# Patient Record
Sex: Female | Born: 1955
Health system: Southern US, Community
[De-identification: ages and names within clinical notes are randomized; demographics above are authoritative.]

## PROBLEM LIST (undated history)

## (undated) DIAGNOSIS — E1169 Type 2 diabetes mellitus with other specified complication: Secondary | ICD-10-CM

## (undated) DIAGNOSIS — F419 Anxiety disorder, unspecified: Secondary | ICD-10-CM

## (undated) DIAGNOSIS — R748 Abnormal levels of other serum enzymes: Secondary | ICD-10-CM

## (undated) DIAGNOSIS — I10 Essential (primary) hypertension: Secondary | ICD-10-CM

## (undated) DIAGNOSIS — G629 Polyneuropathy, unspecified: Secondary | ICD-10-CM

## (undated) DIAGNOSIS — K5909 Other constipation: Secondary | ICD-10-CM

## (undated) DIAGNOSIS — M545 Low back pain, unspecified: Secondary | ICD-10-CM

## (undated) DIAGNOSIS — E785 Hyperlipidemia, unspecified: Secondary | ICD-10-CM

## (undated) DIAGNOSIS — E7800 Pure hypercholesterolemia, unspecified: Secondary | ICD-10-CM

## (undated) DIAGNOSIS — I2510 Atherosclerotic heart disease of native coronary artery without angina pectoris: Secondary | ICD-10-CM

## (undated) DIAGNOSIS — E669 Obesity, unspecified: Secondary | ICD-10-CM

## (undated) DIAGNOSIS — F3341 Major depressive disorder, recurrent, in partial remission: Secondary | ICD-10-CM

## (undated) HISTORY — DX: Type 2 diabetes mellitus with other specified complication: E11.69

## (undated) HISTORY — DX: Low back pain, unspecified: M54.50

## (undated) HISTORY — DX: Other constipation: K59.09

## (undated) HISTORY — DX: Abnormal levels of other serum enzymes: R74.8

## (undated) HISTORY — DX: Type 2 diabetes mellitus with other specified complication: E66.9

## (undated) HISTORY — DX: Anxiety disorder, unspecified: F41.9

## (undated) HISTORY — DX: Essential (primary) hypertension: I10

## (undated) HISTORY — PX: ABDOMINAL HYSTERECTOMY: SHX81

## (undated) HISTORY — DX: Hyperlipidemia, unspecified: E78.5

## (undated) HISTORY — DX: Obesity, unspecified: E66.9

## (undated) HISTORY — DX: Polyneuropathy, unspecified: G62.9

## (undated) HISTORY — DX: Major depressive disorder, recurrent, in partial remission: F33.41

## (undated) HISTORY — PX: CARDIAC CATHETERIZATION: SHX172

## (undated) HISTORY — DX: Pure hypercholesterolemia, unspecified: E78.00

---

## 2000-03-06 ENCOUNTER — Encounter: Payer: Self-pay | Admitting: Family Medicine

## 2000-03-06 ENCOUNTER — Encounter: Admission: RE | Admit: 2000-03-06 | Discharge: 2000-03-06 | Payer: Self-pay | Admitting: Family Medicine

## 2001-04-22 ENCOUNTER — Encounter: Admission: RE | Admit: 2001-04-22 | Discharge: 2001-04-22 | Payer: Self-pay | Admitting: Family Medicine

## 2001-04-22 ENCOUNTER — Encounter: Payer: Self-pay | Admitting: Family Medicine

## 2002-03-04 ENCOUNTER — Encounter: Admission: RE | Admit: 2002-03-04 | Discharge: 2002-03-04 | Payer: Self-pay | Admitting: Family Medicine

## 2002-03-04 ENCOUNTER — Encounter: Payer: Self-pay | Admitting: Family Medicine

## 2002-06-25 ENCOUNTER — Encounter: Admission: RE | Admit: 2002-06-25 | Discharge: 2002-06-25 | Payer: Self-pay | Admitting: Family Medicine

## 2002-06-25 ENCOUNTER — Encounter: Payer: Self-pay | Admitting: Family Medicine

## 2002-08-05 ENCOUNTER — Encounter: Payer: Self-pay | Admitting: Cardiology

## 2002-08-05 ENCOUNTER — Ambulatory Visit (HOSPITAL_COMMUNITY): Admission: RE | Admit: 2002-08-05 | Discharge: 2002-08-05 | Payer: Self-pay | Admitting: Cardiology

## 2004-05-05 ENCOUNTER — Encounter: Admission: RE | Admit: 2004-05-05 | Discharge: 2004-05-05 | Payer: Self-pay | Admitting: Family Medicine

## 2005-11-08 ENCOUNTER — Encounter: Admission: RE | Admit: 2005-11-08 | Discharge: 2005-11-08 | Payer: Self-pay | Admitting: Family Medicine

## 2006-02-07 ENCOUNTER — Encounter: Admission: RE | Admit: 2006-02-07 | Discharge: 2006-02-07 | Payer: Self-pay | Admitting: Family Medicine

## 2006-02-17 ENCOUNTER — Emergency Department: Payer: Self-pay | Admitting: Emergency Medicine

## 2006-02-21 ENCOUNTER — Other Ambulatory Visit: Admission: RE | Admit: 2006-02-21 | Discharge: 2006-02-21 | Payer: Self-pay | Admitting: Family Medicine

## 2006-02-25 ENCOUNTER — Encounter: Admission: RE | Admit: 2006-02-25 | Discharge: 2006-02-25 | Payer: Self-pay | Admitting: Family Medicine

## 2006-04-08 ENCOUNTER — Ambulatory Visit: Payer: Self-pay | Admitting: Family Medicine

## 2006-04-22 ENCOUNTER — Ambulatory Visit: Payer: Self-pay | Admitting: Family Medicine

## 2006-07-23 ENCOUNTER — Ambulatory Visit: Payer: Self-pay | Admitting: Family Medicine

## 2006-07-23 ENCOUNTER — Encounter: Admission: RE | Admit: 2006-07-23 | Discharge: 2006-07-23 | Payer: Self-pay | Admitting: Family Medicine

## 2006-08-20 ENCOUNTER — Ambulatory Visit (HOSPITAL_COMMUNITY): Admission: RE | Admit: 2006-08-20 | Discharge: 2006-08-20 | Payer: Self-pay | Admitting: Orthopedic Surgery

## 2006-10-10 ENCOUNTER — Ambulatory Visit: Payer: Self-pay | Admitting: Family Medicine

## 2007-02-07 ENCOUNTER — Ambulatory Visit: Payer: Self-pay | Admitting: Family Medicine

## 2007-07-17 ENCOUNTER — Ambulatory Visit: Payer: Self-pay | Admitting: Family Medicine

## 2007-09-22 ENCOUNTER — Ambulatory Visit: Payer: Self-pay | Admitting: Family Medicine

## 2007-12-24 ENCOUNTER — Ambulatory Visit: Payer: Self-pay | Admitting: Family Medicine

## 2008-02-04 ENCOUNTER — Ambulatory Visit: Payer: Self-pay | Admitting: Family Medicine

## 2008-06-03 ENCOUNTER — Emergency Department (HOSPITAL_COMMUNITY): Admission: EM | Admit: 2008-06-03 | Discharge: 2008-06-03 | Payer: Self-pay | Admitting: Emergency Medicine

## 2008-06-08 ENCOUNTER — Ambulatory Visit: Payer: Self-pay | Admitting: Family Medicine

## 2008-06-22 ENCOUNTER — Ambulatory Visit (HOSPITAL_COMMUNITY): Admission: RE | Admit: 2008-06-22 | Discharge: 2008-06-22 | Payer: Self-pay | Admitting: Obstetrics and Gynecology

## 2008-06-22 ENCOUNTER — Encounter (INDEPENDENT_AMBULATORY_CARE_PROVIDER_SITE_OTHER): Payer: Self-pay | Admitting: Obstetrics and Gynecology

## 2009-02-09 ENCOUNTER — Ambulatory Visit: Payer: Self-pay | Admitting: Family Medicine

## 2009-05-24 ENCOUNTER — Encounter (INDEPENDENT_AMBULATORY_CARE_PROVIDER_SITE_OTHER): Payer: Self-pay | Admitting: Obstetrics and Gynecology

## 2009-05-24 ENCOUNTER — Ambulatory Visit (HOSPITAL_COMMUNITY): Admission: RE | Admit: 2009-05-24 | Discharge: 2009-05-25 | Payer: Self-pay | Admitting: Obstetrics and Gynecology

## 2009-06-10 ENCOUNTER — Ambulatory Visit: Admission: RE | Admit: 2009-06-10 | Discharge: 2009-06-10 | Payer: Self-pay | Admitting: Gynecology

## 2009-09-28 ENCOUNTER — Ambulatory Visit: Payer: Self-pay | Admitting: Family Medicine

## 2009-09-28 ENCOUNTER — Encounter: Admission: RE | Admit: 2009-09-28 | Discharge: 2009-09-28 | Payer: Self-pay | Admitting: Family Medicine

## 2010-01-05 ENCOUNTER — Ambulatory Visit: Payer: Self-pay | Admitting: Family Medicine

## 2010-01-19 ENCOUNTER — Ambulatory Visit: Payer: Self-pay | Admitting: Physician Assistant

## 2010-01-27 ENCOUNTER — Encounter: Admission: RE | Admit: 2010-01-27 | Discharge: 2010-01-27 | Payer: Self-pay | Admitting: Gastroenterology

## 2010-04-24 ENCOUNTER — Ambulatory Visit: Payer: Self-pay | Admitting: Family Medicine

## 2010-08-08 ENCOUNTER — Ambulatory Visit: Payer: Self-pay | Admitting: Family Medicine

## 2010-11-29 ENCOUNTER — Ambulatory Visit
Admission: RE | Admit: 2010-11-29 | Discharge: 2010-11-29 | Payer: Self-pay | Source: Home / Self Care | Attending: Family Medicine | Admitting: Family Medicine

## 2011-02-11 LAB — CBC
HCT: 30.1 % — ABNORMAL LOW (ref 36.0–46.0)
Hemoglobin: 9.6 g/dL — ABNORMAL LOW (ref 12.0–15.0)
MCHC: 33.5 g/dL (ref 30.0–36.0)
MCV: 84.2 fL (ref 78.0–100.0)
Platelets: 387 10*3/uL (ref 150–400)
RBC: 4.5 MIL/uL (ref 3.87–5.11)
WBC: 11.1 10*3/uL — ABNORMAL HIGH (ref 4.0–10.5)
WBC: 8.2 10*3/uL (ref 4.0–10.5)

## 2011-02-11 LAB — COMPREHENSIVE METABOLIC PANEL
Albumin: 3.7 g/dL (ref 3.5–5.2)
Alkaline Phosphatase: 60 U/L (ref 39–117)
BUN: 6 mg/dL (ref 6–23)
Creatinine, Ser: 0.69 mg/dL (ref 0.4–1.2)
Glucose, Bld: 242 mg/dL — ABNORMAL HIGH (ref 70–99)
Potassium: 3.4 mEq/L — ABNORMAL LOW (ref 3.5–5.1)
Sodium: 139 mEq/L (ref 135–145)
Total Bilirubin: 0.4 mg/dL (ref 0.3–1.2)
Total Protein: 7.3 g/dL (ref 6.0–8.3)

## 2011-02-11 LAB — GLUCOSE, CAPILLARY
Glucose-Capillary: 155 mg/dL — ABNORMAL HIGH (ref 70–99)
Glucose-Capillary: 184 mg/dL — ABNORMAL HIGH (ref 70–99)
Glucose-Capillary: 204 mg/dL — ABNORMAL HIGH (ref 70–99)

## 2011-02-11 LAB — URINALYSIS, ROUTINE W REFLEX MICROSCOPIC
Bilirubin Urine: NEGATIVE
Glucose, UA: 500 mg/dL — AB
Nitrite: NEGATIVE
Urobilinogen, UA: 0.2 mg/dL (ref 0.0–1.0)
pH: 6 (ref 5.0–8.0)

## 2011-02-11 LAB — BASIC METABOLIC PANEL
Calcium: 8.8 mg/dL (ref 8.4–10.5)
Creatinine, Ser: 0.48 mg/dL (ref 0.4–1.2)
GFR calc non Af Amer: >60 mL/min (ref >60)
Potassium: 4.9 mEq/L (ref 3.5–5.1)

## 2011-02-11 LAB — PREGNANCY, URINE: Preg Test, Ur: NEGATIVE

## 2011-03-20 NOTE — Op Note (Signed)
NAME:  Angel Reynolds, Angel Reynolds NO.:  [id number]   MEDICAL RECORD NO.:  [id number]          PATIENT TYPE:  AMB   LOCATION:  SDC                           FACILITY:  [facility]   PHYSICIAN:  Malva Limes, M.D.    DATE OF BIRTH:  1956-02-25   DATE OF PROCEDURE:  06/22/2008  DATE OF DISCHARGE:                               OPERATIVE REPORT   PREOPERATIVE DIAGNOSIS:  Postmenopausal bleeding.   POSTOPERATIVE DIAGNOSIS:  Postmenopausal bleeding.   PROCEDURE:  1. Dilation and curettage.  2. Hysteroscopy.   SURGEON:  Malva Limes, MD.   ANESTHESIA:  MAC with paracervical block.   DRAINS:  None.   ANTIBIOTIC:  Ancef 1 gram.   ESTIMATED BLOOD LOSS:  Minimal.   SPECIMENS:  Endometrial curettings sent to pathology.   FINDINGS:  The patient had multiple uterine polyps and several submucous  fibroids.   DESCRIPTION OF PROCEDURE:  The patient was taken to the operating room  where she was placed in a dorsal supine position.  MAC anesthesia was  administered without difficulty.  The patient was then placed in a  dorsal lithotomy position.  She was prepped and draped in the usual  fashion for this procedure.  The patient had an exam under anesthesia  which revealed a multilobulated uterus consistent with fibroids,  approximately 12 weeks in size.  At this point, the cervix was injected  with 10 mL of 1% lidocaine.  The cervix was serially dilated and the  hysteroscope was advanced to the endocervical canal, which appeared to  be normal.  On entering the uterine cavity, extensive polyps were  identified.  At this point, the hysteroscope was removed and sharp  curettage was performed with a copious amount of tissue being sent to  pathology.  Hysteroscope was replaced after the D&C and it appeared all  polyps had been removed.  As a conclusion of the procedure, the patient  was taken to the recovery room in stable condition.  Instrument and lap  counts were correct x1.     ______________________________  Malva Limes, M.D.    MA/MEDQ  D:  06/22/2008  T:  06/22/2008  Job:  [id number]

## 2011-03-20 NOTE — Op Note (Signed)
NAMESHERLYNN, TOURVILLE NO.:  [id number]   MEDICAL RECORD NO.:  [id number]          PATIENT TYPE:  INP   LOCATION:  9303                          FACILITY:  [facility]   PHYSICIAN:  Randye Lobo, M.D.   DATE OF BIRTH:  1955-12-17   DATE OF PROCEDURE:  05/24/2009  DATE OF DISCHARGE:                               OPERATIVE REPORT   PREOPERATIVE DIAGNOSES:  1. Complex endometrial hyperplasia with focal atypia.  2. Genuine stress incontinence.   POSTOPERATIVE DIAGNOSES:  1. Complex endometrial hyperplasia with focal atypia.  2. Genuine stress incontinence.   PROCEDURE:  Laparoscopically-assisted vaginal hysterectomy with  bilateral salpingo-oophorectomy, McCall culdoplasty, Monarch  transobturator mid urethral sling, cystoscopy.   SURGEON:  Randye Lobo, MD   ASSISTANT:  Gretchen Short, [facility]   ANESTHESIA:  General endotracheal.   ESTIMATED BLOOD LOSS:  500 mL.   URINE OUTPUT:  Quantity sufficient.   COMPLICATIONS:  None.   INDICATIONS FOR THE PROCEDURE:  The patient is a 55 year old, para 1,  Caucasian female, who was referred by Dr. Malva Limes for evaluation  and treatment of the patient's menometrorrhagia and urinary incontinence  with stressful maneuvers.  The patient originally had been cared for by  Elpidio Galea, who made the initial referral to Dr. Dareen Piano.  The patient  has had bleeding abnormalities for over 1 year and in August 2009, she  underwent a dilation and curettage at which time the pathology report  documented an endometrial polyp with a focus of simple hyperplasia.  The  patient recently had been experiencing heavy and prolonged menses and  she desires definitive treatment.  She would also like treatment of her  urinary incontinence.  She did undergo urodynamic testing in the office  which confirmed the presence of genuine stress incontinence.  She had a  preoperative endometrial biopsy has shown a simple and complex  hyperplasia with focal  atypia.  The specimen was thought to be adequate  for evaluation by the pathologist.  A plan is now made at this time to  proceed with a laparoscopically-assisted vaginal hysterectomy with  bilateral salpingo-oophorectomy and a Monarch transobturator mid  urethral sling and cystoscopy after risks, benefits, and alternatives  have been reviewed.   FINDINGS:  Examination under anesthesia revealed a tight pubic arch.  There was first-degree cervical prolapse.  There was no significant  cystocele, no rectocele.   Laparoscopy demonstrated a normal uterus, tubes, and ovaries.  The liver  edge could be visualized and this appeared to be normal, although the  liver was not visualized well.  This was due to the patient's bowel and  adiposity.   SPECIMENS:  The uterus, tubes, and ovaries were sent to pathology.   PROCEDURE:  The patient was reidentified in the preoperative hold area.  She did receive cefotetan 2 g IV for antibiotic prophylaxis.  She  received both TED hose and PAS stockings for DVT prophylaxis.   In the operating room, general endotracheal anesthesia was induced and  the patient was placed in the dorsal lithotomy position.  The abdomen,  vagina, and perineum were sterilely  prepped and draped.  A Foley  catheter was placed inside the bladder.  A speculum was placed inside  the vagina and a single-tooth tenaculum was placed on the anterior  cervical lip.  This was replaced with a Hulka tenaculum.   The procedure began by creating a 1-cm umbilical incision with a  scalpel.  A 10-mm trocar was inserted directly into the peritoneal  cavity after an unsuccessful attempt was made to use a Veress needle.  The laparoscope did confirm proper placement, and then a CO2  pneumoperitoneum was achieved.  The patient was placed in the  Trendelenburg position.  A 5-mm right and left lower quadrant incisions  were created and 5-mm trocars were placed under direct visualization of  the  laparoscope.   An exam of the pelvic and abdominal organs was performed.  There was  significant weight of the abdominal wall and there was a large amount of  adipose tissue hanging off the bowel which made the laparoscopically-  assisted vaginal hysterectomy and bilateral salpingo-oophorectomy a  challenge.  There were some adhesions between the sigmoid colon and the  left pelvic sidewall above the level of the infundibulopelvic ligament  and these were sharply lysed.   The region of the left ureter was identified.  The left  infundibulopelvic ligament was then triply cauterized with the gyrus  instrument and was divided with the same instrument.  The dissection  continued through the broad ligament again using cautery and cutting  from the gyrus instrument.  The left round ligament was then cauterized  and sharply bisected with the same instrument.  The dissection was  carried down to the uterine artery on the patient's left-hand side.  The  bladder flap was partially taken down on this side.  The same procedure  that was performed on the left-hand side was repeated on the right-hand  side after the right ureter was identified.  The bladder flap was not  taken down as far on the patient's right-hand side.   Hemostasis was good at this time and a decision was made to proceed with  the vaginal portion of the procedure.   The patient was placed in the high-lithotomy position.  Single-tooth  tenaculums were placed on the anterior and posterior cervical lips.  The  cervix was injected with 0.5% lidocaine with epinephrine 1:200,000.  The  cervix was circumscribed with a scalpel and the posterior cul-de-sac was  entered sharply with a Mayo scissors and digitally exam confirmed proper  entry into this location.   A long weighted speculum was placed inside the posterior cul-de-sac.  Each of the uterosacral ligaments were clamped, sharply divided, and  suture-ligated with transfixing  sutures of 0 Vicryl.  A second clamp was  placed on the upper portion of each of the uterosacral ligaments and  these were sharply divided and again suture-ligated with 0 Vicryl.   The bladder was dissected off the lower uterine segment.  The cervix was  noted to be very long.  The bladder pillars were clamped, sharply  divided, and suture-ligated with 0 Vicryl.  Sharp and careful dissection  was performed along the anterior cervix to provide proper entry into the  vesicouterine fold of the anterior cul-de-sac.  The cardinal ligaments  were sequentially clamped, sharply divided, and suture-ligated with 0  Vicryl bilaterally.   Eventually, the anterior cul-de-sac was entered and the Deaver retractor  was placed in the anterior cul-de-sac.  The remaining portion of the  cardinal ligaments and  the base of the broad ligaments were clamped,  sharply divided, and suture-ligated with 0 Vicryl bilaterally.  The  specimen was freed at this time.  There was some bleeding noted from the  uterine artery pedicle on the patient's right-hand side and this was  isolated and grasped with a clamp and then a free-tie was placed which  provided excellent hemostasis.  There was some bleeding noted of the  uterine artery pedicles bilaterally which were oozing slightly because  the peritoneum had not been included in each of these pedicles.  Therefore, figure-of-eight sutures were placed bilaterally along these  pedicles to create good hemostasis.   Hemostasis was noted to be adequate at this time.  The posterior vaginal  cuff was therefore whip stitched with a running locked suture of 0  Vicryl.  A culdoplasty suture was placed with 0 Vicryl through the  vagina at the 6 o'clock position and into the cul-de-sac, down through  the distal left uterosacral ligament.  It was then brought across the  posterior cul-de-sac in a pursestring fashion.  The suture was brought  down through the distal right uterosacral  ligament and then out of the  cul-de-sac and into the vagina at the 6 o'clock position.   The vagina was next closed at this time with a running locked suture of  0 Vicryl.  The culdoplasty suture was tied.  This provided good support  and elevation of the vaginal cuff.   The sling was performed next.  The anterior vaginal mucosa was marked  with 2 Allis clamps, one 1 cm below the urethral meatus, and one 4 cm  below the urethral meatus.  The mucosa was injected locally with 0.5%  lidocaine with 1:200,000 of epinephrine.  The vaginal mucosa was incised  vertically in the midline with a scalpel.  A combination of sharp and  blunt dissection were used to dissect the vaginal mucosa off the  overlying bladder tissue.  The dissection was carried back to the level  of the pubic rami bilaterally.  Hemostasis was created with monopolar  cautery.   The crural fold incisions were then created below the level of the  adductor longus muscles and lateral to the clitoris on each side at the  lateral borders of the pubic rami.  The Monarch needle passer was first  placed through the patient's left-hand side.  The needle passer was  brought through the obturator membrane and muscle and then out through  the endopelvic fascia on the ipsilateral side.  The same procedure was  performed on the right-hand side.  The sling was then attached through  each of these needle passers and was brought out through the thigh  incisions.   The Foley catheter was removed and cystoscopy.  The cystoscopy was  performed at this time.  The cystoscopy revealed the bladder to be  without evidence of a foreign body in either the bladder or the urethra.  The bladder was visualized throughout 360 degrees including the bladder  dome and trigone.  The ureters were noted to be patent bilaterally.   The bladder was drained of the cystoscopy fluid and the Foley catheter  was replaced.  A Kelly clamp was then placed between the  urethra and the  sling as the plastic sheaths were removed.  The sling was noted to be in  excellent position.  The excess sling was trimmed at the thigh  incisions.   Excess vaginal mucosa was trimmed and the anterior vaginal  mucosal  incision was closed with a running locked suture of 2-0 Vicryl.   Hemostasis was good and a packing with Estrace cream was placed inside  the vagina.  The thigh incisions were closed with Dermabond.   Final laparoscopy was performed after a pneumoperitoneum was achieved.  The pelvis was irrigated and suctioned and all of the pedicle sites were  examined and found to be hemostatic.  The procedure was therefore  discontinued.   The 5-mm trocars were removed under visualization of the laparoscope.  A  10-mm umbilical trocar and the laparoscope were removed simultaneously  after the pneumoperitoneum had been released.   The skin incisions were closed with subcuticular closures of 4-0 Vicryl  suture and Dermabond was placed over the incisions.   This concluded the patient's procedure.  There were no complications.  All needle, instrument, sponge counts were correct.      Randye Lobo, M.D.  Electronically Signed     BES/MEDQ  D:  05/24/2009  T:  05/25/2009  Job:  [id number]

## 2011-03-20 NOTE — Consult Note (Signed)
Angel Reynolds, Angel Reynolds NO.:  [id number]   MEDICAL RECORD NO.:  [id number]          PATIENT TYPE:  OUT   LOCATION:  GYN                          FACILITY:  [facility]   PHYSICIAN:  De Blanch, M.D.DATE OF BIRTH:  02-Jul-1956   DATE OF CONSULTATION:  DATE OF DISCHARGE:                                 CONSULTATION   CHIEF COMPLAINT:  Endometrial cancer.   HISTORY OF PRESENT ILLNESS:  A 55 year old white female seen in  consultation at the request of Dr. Edward Jolly regarding management of a newly  diagnosed well-differentiated endometrial carcinoma.   The patient underwent total laparoscopic hysterectomy and sling  procedure on May 24, 2009.  Preoperatively, it was known that she had  atypical endometrial hyperplasia.  She has had an uncomplicated  postoperative course.  Final pathology showed a well-differentiated  endometrioid adenocarcinoma arising in a background of complex  hyperplasia with atypia.  There is no evidence of myometrial invasion  involvement of adnexa or cervix.   PAST MEDICAL HISTORY/MEDICAL ILLNESSES:  1. Diabetes.  2. Hypertension.  3. Obesity.  4. Hyperlipidemia.  5. Anxiety.   PAST SURGICAL HISTORY:  1. Total laparoscopic hysterectomy and sling procedure.  2. Arthroscopy of her knee.   CURRENT MEDICATIONS:  Avandamet, Lantus, Lipitor, Tricor, Lotensin, baby  aspirin, iron, Prozac, Cosamin.   DRUG ALLERGIES:  MOBIC (PALPITATIONS).   FAMILY HISTORY:  The patient has a sister with breast cancer.   SOCIAL HISTORY:  The patient is married.  She does not smoke.  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   REVIEW OF SYSTEMS:  A 10-point comprehensive review of systems negative  except as noted above.   PHYSICAL EXAMINATION:  VITAL SIGNS:  Weight 240 pounds, height 5 feet 3  inches, pulse 130/62.  GENERAL:  The patient is a healthy white female in no acute distress.  She does not wish to be examined today.  She  indicates that she has no  surgical problems to be evaluated.   ASSESSMENT/PLAN:  I had a lengthy discussion with the patient and her  daughter regarding the pathology.  Overall, I have reviewed his as a  favorable pathology report (stage Ia grade 1), and therefore, would not  recommend  any additional adjuvant therapy.  The risks of recurrence are very  small.  Would suggest that she be seen by Dr. Edward Jolly every 6 months for  the next 3 years with physical exam and Pap smears at that interval.  I  would be happy to see the patient back if she had any difficulties, but  overall, I expect that she should do well.      De Blanch, M.D.  Electronically Signed     DC/MEDQ  D:  06/10/2009  T:  06/10/2009  Job:  [id number]   cc:   Randye Lobo, M.D.  Fax: [phone]   Telford Nab, R.N.  [address]  [place], [place] [address]

## 2011-03-23 NOTE — Op Note (Signed)
NAME:  Angel Reynolds, Angel Reynolds               ACCOUNT NO.:  [id number]   MEDICAL RECORD NO.:  [id number]          PATIENT TYPE:  AMB   LOCATION:  SDS                          FACILITY:  [facility]   PHYSICIAN:  Burnard Bunting, M.D.    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   PROCEDURES:  Left knee diagnostic and operative arthroscopy with partial  medial and lateral meniscectomy, debridement of loose chondral flap on  the medial femoral condyle and trochlea, with removal of 5 x 5-mm loose  bodies times one.   ATTENDING SURGEON:  Burnard Bunting, M.D.   ASSISTANT:  None.   ANESTHESIA:  General endotracheal.   ESTIMATED BLOOD LOSS:  Minimal.   INDICATIONS:  Angel Reynolds is a 55 year old patient with knee pain and  mechanical symptoms, who presents for diagnostic and operative  arthroscopy and debridement after failure of conservative management.   OPERATIVE FINDINGS:  1. Exam under anesthesia, range of motion 0 to 130 degrees with      stability to varus and valgus stress.  2. Diagnostic and operative arthroscopy:  Intact ACL, PCL.  Grade 4      chondromalacia in the length of the trochlea.  Grade 4      chondromalacia over the medial femoral condyle and medial tibial      plateau, 50 and 25% respectively, with tear of the posterior horn      of the medial meniscus.  Loose body, 5 x 5 mm, in the anterior      compartment.  Generally intact articular cartilage on the lateral      side, tibia and femur, with tear of the lateral meniscus anterior      horn.   PROCEDURE IN DETAIL:  The patient was brought to the operating room,  where  general endotracheal anesthesia was induced.  The left leg was  prepped with DuraPrep solution, including the foot, and draped in a  sterile manner.  Topographic anatomy of the knee was identified,  including the medial and lateral margins of the patellar tendon, the  medial and lateral joint line, and anterior and inferolateral portals  were established.  Anterior inferomedial portal was then established  under direct vision.  Next, diagnostic arthroscopy was performed.  The  patellofemoral compartment showed grade 2 changes of the undersurface of  the patella, with grade 4 changes and loose chondral flaps in the  trochlea in the landing zone, and these chondral flaps were debrided.  Chondroplasty was performed.  No loose bodies were noted in the medial  and lateral gutters.  The medial compartment was inspected and was found  to have chondromalacia, both grade 4 and grade 3, on the medial femoral  condyle.  There was a kissing lesion on the  tibial plateau.  The total  surface areas were approximately 50 and 25% respectively.  Loose  chondral flaps were debrided from the medial femoral condyle.  The  medial meniscal tear was resected back to a stable rim.  All in all,  about 50% of the meniscus was involved.  The ACL and PCL were intact.  A  loose body was noted in the anterior compartment, which was removed.  The lateral compartment showed a degenerative tear of the lateral  meniscus, which was debrided back to a stable rim using a combination of  basket, punch and shavers.  There were no loose bodies in the lateral  underneath the popliteus.  The articular cartilage of the lateral  compartment was intact.  Following debridement of the menisci,  chondroplasty and removal of loose bodies, the knee joint was thoroughly  irrigated.  Instruments were removed.  Portals were closed using 3-0  nylon.  A solution of Marcaine, morphine and clonidine was injected into  the knee.  The patient was  placed in a bulky dressing, tolerated the  procedure well without immediate complications.      Burnard Bunting, M.D.  Electronically Signed     GSD/MEDQ  D:  08/20/2006  T:  08/21/2006  Job:  [id number]

## 2011-08-03 LAB — POCT I-STAT, CHEM 8
BUN: 13
Calcium, Ion: 1.15
Chloride: 99
Creatinine, Ser: 0.8
Glucose, Bld: 150 — ABNORMAL HIGH
HCT: 33 — ABNORMAL LOW
Hemoglobin: 11.2 — ABNORMAL LOW
TCO2: 29

## 2011-08-03 LAB — GLUCOSE, CAPILLARY

## 2011-08-15 ENCOUNTER — Other Ambulatory Visit: Payer: Self-pay | Admitting: Medical

## 2011-08-15 NOTE — Telephone Encounter (Signed)
PLEASE SIGN OFF ON RX- TKS

## 2011-08-16 MED ORDER — FLUOXETINE HCL 40 MG PO CAPS: 40.0000 mg | ORAL_CAPSULE | Freq: Every day | ORAL | Status: DC

## 2011-09-18 ENCOUNTER — Encounter: Payer: Self-pay | Admitting: Family Medicine

## 2011-10-08 ENCOUNTER — Other Ambulatory Visit: Payer: Self-pay | Admitting: Medical

## 2011-10-09 NOTE — Telephone Encounter (Signed)
RX REFILL ON PROZAC.

## 2011-10-11 ENCOUNTER — Telehealth: Payer: Self-pay | Admitting: Family Medicine

## 2011-10-11 MED ORDER — FLUOXETINE HCL 40 MG PO CAPS: 40.0000 mg | ORAL_CAPSULE | Freq: Every day | ORAL | Status: DC

## 2011-10-11 NOTE — Telephone Encounter (Signed)
PT IS COMPLETELY OUT OF PROZAC HAS APPT ON Oct 22 2011. PT USES [facility] ON [address]

## 2011-10-11 NOTE — Telephone Encounter (Signed)
Prozac renewed patient has an appointment in the near future

## 2011-10-13 ENCOUNTER — Other Ambulatory Visit: Payer: Self-pay | Admitting: Medical

## 2011-10-15 NOTE — Telephone Encounter (Signed)
pls pull chart 

## 2011-10-15 NOTE — Telephone Encounter (Signed)
rx refill on prozac.

## 2011-10-16 NOTE — Telephone Encounter (Signed)
pls pull chart 

## 2011-10-16 NOTE — Telephone Encounter (Signed)
rx refill on prozac. 

## 2011-10-22 ENCOUNTER — Encounter: Payer: Self-pay | Admitting: Medical

## 2012-12-12 ENCOUNTER — Telehealth: Payer: Self-pay | Admitting: Internal Medicine

## 2012-12-12 NOTE — Telephone Encounter (Signed)
Pt is transferring out and will come by to pay $10.00 to get her records faxed over to [facility]

## 2013-01-26 DIAGNOSIS — Z0289 Encounter for other administrative examinations: Secondary | ICD-10-CM

## 2014-12-14 ENCOUNTER — Emergency Department: Payer: Self-pay | Admitting: Emergency Medicine

## 2016-05-02 ENCOUNTER — Ambulatory Visit: Payer: BLUE CROSS/BLUE SHIELD | Attending: General Practice | Admitting: Physical Therapy

## 2019-12-02 DIAGNOSIS — F17200 Nicotine dependence, unspecified, uncomplicated: Secondary | ICD-10-CM | POA: Diagnosis not present

## 2019-12-02 DIAGNOSIS — M545 Low back pain: Secondary | ICD-10-CM | POA: Diagnosis not present

## 2019-12-02 DIAGNOSIS — K5909 Other constipation: Secondary | ICD-10-CM | POA: Diagnosis not present

## 2019-12-02 DIAGNOSIS — I10 Essential (primary) hypertension: Secondary | ICD-10-CM | POA: Diagnosis not present

## 2019-12-02 DIAGNOSIS — F419 Anxiety disorder, unspecified: Secondary | ICD-10-CM | POA: Diagnosis not present

## 2019-12-02 DIAGNOSIS — R748 Abnormal levels of other serum enzymes: Secondary | ICD-10-CM | POA: Diagnosis not present

## 2019-12-02 DIAGNOSIS — F329 Major depressive disorder, single episode, unspecified: Secondary | ICD-10-CM | POA: Diagnosis not present

## 2019-12-02 DIAGNOSIS — G629 Polyneuropathy, unspecified: Secondary | ICD-10-CM | POA: Diagnosis not present

## 2019-12-02 DIAGNOSIS — E7800 Pure hypercholesterolemia, unspecified: Secondary | ICD-10-CM | POA: Diagnosis not present

## 2019-12-30 DIAGNOSIS — F419 Anxiety disorder, unspecified: Secondary | ICD-10-CM | POA: Diagnosis not present

## 2019-12-30 DIAGNOSIS — Z6832 Body mass index (BMI) 32.0-32.9, adult: Secondary | ICD-10-CM | POA: Diagnosis not present

## 2019-12-30 DIAGNOSIS — I10 Essential (primary) hypertension: Secondary | ICD-10-CM | POA: Diagnosis not present

## 2019-12-30 DIAGNOSIS — M545 Low back pain: Secondary | ICD-10-CM | POA: Diagnosis not present

## 2019-12-30 DIAGNOSIS — K5909 Other constipation: Secondary | ICD-10-CM | POA: Diagnosis not present

## 2019-12-30 DIAGNOSIS — R748 Abnormal levels of other serum enzymes: Secondary | ICD-10-CM | POA: Diagnosis not present

## 2019-12-30 DIAGNOSIS — F329 Major depressive disorder, single episode, unspecified: Secondary | ICD-10-CM | POA: Diagnosis not present

## 2019-12-30 DIAGNOSIS — E7800 Pure hypercholesterolemia, unspecified: Secondary | ICD-10-CM | POA: Diagnosis not present

## 2019-12-30 DIAGNOSIS — G629 Polyneuropathy, unspecified: Secondary | ICD-10-CM | POA: Diagnosis not present

## 2020-01-20 DIAGNOSIS — E7800 Pure hypercholesterolemia, unspecified: Secondary | ICD-10-CM | POA: Diagnosis not present

## 2020-01-20 DIAGNOSIS — R748 Abnormal levels of other serum enzymes: Secondary | ICD-10-CM | POA: Diagnosis not present

## 2020-01-20 DIAGNOSIS — F329 Major depressive disorder, single episode, unspecified: Secondary | ICD-10-CM | POA: Diagnosis not present

## 2020-01-20 DIAGNOSIS — G629 Polyneuropathy, unspecified: Secondary | ICD-10-CM | POA: Diagnosis not present

## 2020-01-20 DIAGNOSIS — F419 Anxiety disorder, unspecified: Secondary | ICD-10-CM | POA: Diagnosis not present

## 2020-01-20 DIAGNOSIS — K5909 Other constipation: Secondary | ICD-10-CM | POA: Diagnosis not present

## 2020-01-20 DIAGNOSIS — E119 Type 2 diabetes mellitus without complications: Secondary | ICD-10-CM | POA: Diagnosis not present

## 2020-01-20 DIAGNOSIS — M545 Low back pain: Secondary | ICD-10-CM | POA: Diagnosis not present

## 2020-01-20 DIAGNOSIS — I10 Essential (primary) hypertension: Secondary | ICD-10-CM | POA: Diagnosis not present

## 2020-02-25 DIAGNOSIS — F17200 Nicotine dependence, unspecified, uncomplicated: Secondary | ICD-10-CM | POA: Diagnosis not present

## 2020-02-25 DIAGNOSIS — K5909 Other constipation: Secondary | ICD-10-CM | POA: Diagnosis not present

## 2020-02-25 DIAGNOSIS — M25552 Pain in left hip: Secondary | ICD-10-CM | POA: Diagnosis not present

## 2020-02-25 DIAGNOSIS — M545 Low back pain: Secondary | ICD-10-CM | POA: Diagnosis not present

## 2020-02-25 DIAGNOSIS — F419 Anxiety disorder, unspecified: Secondary | ICD-10-CM | POA: Diagnosis not present

## 2020-02-25 DIAGNOSIS — F329 Major depressive disorder, single episode, unspecified: Secondary | ICD-10-CM | POA: Diagnosis not present

## 2020-02-25 DIAGNOSIS — G629 Polyneuropathy, unspecified: Secondary | ICD-10-CM | POA: Diagnosis not present

## 2020-02-25 DIAGNOSIS — E669 Obesity, unspecified: Secondary | ICD-10-CM | POA: Diagnosis not present

## 2020-02-25 DIAGNOSIS — R748 Abnormal levels of other serum enzymes: Secondary | ICD-10-CM | POA: Diagnosis not present

## 2020-03-10 DIAGNOSIS — I10 Essential (primary) hypertension: Secondary | ICD-10-CM | POA: Diagnosis not present

## 2020-03-10 DIAGNOSIS — F329 Major depressive disorder, single episode, unspecified: Secondary | ICD-10-CM | POA: Diagnosis not present

## 2020-03-10 DIAGNOSIS — E7800 Pure hypercholesterolemia, unspecified: Secondary | ICD-10-CM | POA: Diagnosis not present

## 2020-03-10 DIAGNOSIS — K5909 Other constipation: Secondary | ICD-10-CM | POA: Diagnosis not present

## 2020-03-10 DIAGNOSIS — G629 Polyneuropathy, unspecified: Secondary | ICD-10-CM | POA: Diagnosis not present

## 2020-03-10 DIAGNOSIS — E669 Obesity, unspecified: Secondary | ICD-10-CM | POA: Diagnosis not present

## 2020-03-10 DIAGNOSIS — R748 Abnormal levels of other serum enzymes: Secondary | ICD-10-CM | POA: Diagnosis not present

## 2020-03-10 DIAGNOSIS — F419 Anxiety disorder, unspecified: Secondary | ICD-10-CM | POA: Diagnosis not present

## 2020-03-10 DIAGNOSIS — F17200 Nicotine dependence, unspecified, uncomplicated: Secondary | ICD-10-CM | POA: Diagnosis not present

## 2020-03-24 DIAGNOSIS — E7800 Pure hypercholesterolemia, unspecified: Secondary | ICD-10-CM | POA: Diagnosis not present

## 2020-03-24 DIAGNOSIS — I10 Essential (primary) hypertension: Secondary | ICD-10-CM | POA: Diagnosis not present

## 2020-03-24 DIAGNOSIS — E669 Obesity, unspecified: Secondary | ICD-10-CM | POA: Diagnosis not present

## 2020-03-24 DIAGNOSIS — M545 Low back pain: Secondary | ICD-10-CM | POA: Diagnosis not present

## 2020-03-24 DIAGNOSIS — Z6832 Body mass index (BMI) 32.0-32.9, adult: Secondary | ICD-10-CM | POA: Diagnosis not present

## 2020-03-24 DIAGNOSIS — K5909 Other constipation: Secondary | ICD-10-CM | POA: Diagnosis not present

## 2020-03-24 DIAGNOSIS — F329 Major depressive disorder, single episode, unspecified: Secondary | ICD-10-CM | POA: Diagnosis not present

## 2020-05-05 DIAGNOSIS — F329 Major depressive disorder, single episode, unspecified: Secondary | ICD-10-CM | POA: Diagnosis not present

## 2020-05-05 DIAGNOSIS — E7800 Pure hypercholesterolemia, unspecified: Secondary | ICD-10-CM | POA: Diagnosis not present

## 2020-05-05 DIAGNOSIS — R748 Abnormal levels of other serum enzymes: Secondary | ICD-10-CM | POA: Diagnosis not present

## 2020-05-05 DIAGNOSIS — M545 Low back pain: Secondary | ICD-10-CM | POA: Diagnosis not present

## 2020-05-05 DIAGNOSIS — E119 Type 2 diabetes mellitus without complications: Secondary | ICD-10-CM | POA: Diagnosis not present

## 2020-05-05 DIAGNOSIS — I10 Essential (primary) hypertension: Secondary | ICD-10-CM | POA: Diagnosis not present

## 2020-05-05 DIAGNOSIS — F419 Anxiety disorder, unspecified: Secondary | ICD-10-CM | POA: Diagnosis not present

## 2020-05-05 DIAGNOSIS — K5909 Other constipation: Secondary | ICD-10-CM | POA: Diagnosis not present

## 2020-05-05 DIAGNOSIS — G629 Polyneuropathy, unspecified: Secondary | ICD-10-CM | POA: Diagnosis not present

## 2020-06-02 DIAGNOSIS — K5909 Other constipation: Secondary | ICD-10-CM | POA: Diagnosis not present

## 2020-06-02 DIAGNOSIS — G629 Polyneuropathy, unspecified: Secondary | ICD-10-CM | POA: Diagnosis not present

## 2020-06-02 DIAGNOSIS — R748 Abnormal levels of other serum enzymes: Secondary | ICD-10-CM | POA: Diagnosis not present

## 2020-06-02 DIAGNOSIS — E7800 Pure hypercholesterolemia, unspecified: Secondary | ICD-10-CM | POA: Diagnosis not present

## 2020-06-02 DIAGNOSIS — M545 Low back pain: Secondary | ICD-10-CM | POA: Diagnosis not present

## 2020-06-02 DIAGNOSIS — F329 Major depressive disorder, single episode, unspecified: Secondary | ICD-10-CM | POA: Diagnosis not present

## 2020-06-02 DIAGNOSIS — F419 Anxiety disorder, unspecified: Secondary | ICD-10-CM | POA: Diagnosis not present

## 2020-06-02 DIAGNOSIS — F17200 Nicotine dependence, unspecified, uncomplicated: Secondary | ICD-10-CM | POA: Diagnosis not present

## 2020-06-02 DIAGNOSIS — E119 Type 2 diabetes mellitus without complications: Secondary | ICD-10-CM | POA: Diagnosis not present

## 2020-06-02 DIAGNOSIS — E669 Obesity, unspecified: Secondary | ICD-10-CM | POA: Diagnosis not present

## 2020-06-30 DIAGNOSIS — F17200 Nicotine dependence, unspecified, uncomplicated: Secondary | ICD-10-CM | POA: Diagnosis not present

## 2020-06-30 DIAGNOSIS — I10 Essential (primary) hypertension: Secondary | ICD-10-CM | POA: Diagnosis not present

## 2020-06-30 DIAGNOSIS — F419 Anxiety disorder, unspecified: Secondary | ICD-10-CM | POA: Diagnosis not present

## 2020-06-30 DIAGNOSIS — G629 Polyneuropathy, unspecified: Secondary | ICD-10-CM | POA: Diagnosis not present

## 2020-06-30 DIAGNOSIS — K5909 Other constipation: Secondary | ICD-10-CM | POA: Diagnosis not present

## 2020-06-30 DIAGNOSIS — M545 Low back pain: Secondary | ICD-10-CM | POA: Diagnosis not present

## 2020-06-30 DIAGNOSIS — E119 Type 2 diabetes mellitus without complications: Secondary | ICD-10-CM | POA: Diagnosis not present

## 2020-06-30 DIAGNOSIS — E7800 Pure hypercholesterolemia, unspecified: Secondary | ICD-10-CM | POA: Diagnosis not present

## 2020-06-30 DIAGNOSIS — F329 Major depressive disorder, single episode, unspecified: Secondary | ICD-10-CM | POA: Diagnosis not present

## 2020-06-30 DIAGNOSIS — R748 Abnormal levels of other serum enzymes: Secondary | ICD-10-CM | POA: Diagnosis not present

## 2020-07-27 DIAGNOSIS — K5909 Other constipation: Secondary | ICD-10-CM | POA: Diagnosis not present

## 2020-07-27 DIAGNOSIS — E7800 Pure hypercholesterolemia, unspecified: Secondary | ICD-10-CM | POA: Diagnosis not present

## 2020-07-27 DIAGNOSIS — M545 Low back pain: Secondary | ICD-10-CM | POA: Diagnosis not present

## 2020-07-27 DIAGNOSIS — E119 Type 2 diabetes mellitus without complications: Secondary | ICD-10-CM | POA: Diagnosis not present

## 2020-07-27 DIAGNOSIS — F17200 Nicotine dependence, unspecified, uncomplicated: Secondary | ICD-10-CM | POA: Diagnosis not present

## 2020-07-27 DIAGNOSIS — R748 Abnormal levels of other serum enzymes: Secondary | ICD-10-CM | POA: Diagnosis not present

## 2020-07-27 DIAGNOSIS — F329 Major depressive disorder, single episode, unspecified: Secondary | ICD-10-CM | POA: Diagnosis not present

## 2020-07-27 DIAGNOSIS — I10 Essential (primary) hypertension: Secondary | ICD-10-CM | POA: Diagnosis not present

## 2020-07-27 DIAGNOSIS — G629 Polyneuropathy, unspecified: Secondary | ICD-10-CM | POA: Diagnosis not present

## 2020-07-27 DIAGNOSIS — F419 Anxiety disorder, unspecified: Secondary | ICD-10-CM | POA: Diagnosis not present

## 2020-08-04 DIAGNOSIS — H25813 Combined forms of age-related cataract, bilateral: Secondary | ICD-10-CM | POA: Diagnosis not present

## 2020-08-04 DIAGNOSIS — E113292 Type 2 diabetes mellitus with mild nonproliferative diabetic retinopathy without macular edema, left eye: Secondary | ICD-10-CM | POA: Diagnosis not present

## 2020-08-12 DIAGNOSIS — E785 Hyperlipidemia, unspecified: Secondary | ICD-10-CM | POA: Diagnosis not present

## 2020-08-12 DIAGNOSIS — E669 Obesity, unspecified: Secondary | ICD-10-CM | POA: Diagnosis not present

## 2020-08-12 DIAGNOSIS — Z0000 Encounter for general adult medical examination without abnormal findings: Secondary | ICD-10-CM | POA: Diagnosis not present

## 2020-08-12 DIAGNOSIS — Z1331 Encounter for screening for depression: Secondary | ICD-10-CM | POA: Diagnosis not present

## 2020-08-12 DIAGNOSIS — Z9181 History of falling: Secondary | ICD-10-CM | POA: Diagnosis not present

## 2020-08-31 DIAGNOSIS — Z139 Encounter for screening, unspecified: Secondary | ICD-10-CM | POA: Diagnosis not present

## 2020-08-31 DIAGNOSIS — G629 Polyneuropathy, unspecified: Secondary | ICD-10-CM | POA: Diagnosis not present

## 2020-08-31 DIAGNOSIS — E119 Type 2 diabetes mellitus without complications: Secondary | ICD-10-CM | POA: Diagnosis not present

## 2020-08-31 DIAGNOSIS — F32A Depression, unspecified: Secondary | ICD-10-CM | POA: Diagnosis not present

## 2020-08-31 DIAGNOSIS — K5909 Other constipation: Secondary | ICD-10-CM | POA: Diagnosis not present

## 2020-08-31 DIAGNOSIS — I10 Essential (primary) hypertension: Secondary | ICD-10-CM | POA: Diagnosis not present

## 2020-08-31 DIAGNOSIS — M25551 Pain in right hip: Secondary | ICD-10-CM | POA: Diagnosis not present

## 2020-08-31 DIAGNOSIS — M545 Low back pain, unspecified: Secondary | ICD-10-CM | POA: Diagnosis not present

## 2020-08-31 DIAGNOSIS — E7800 Pure hypercholesterolemia, unspecified: Secondary | ICD-10-CM | POA: Diagnosis not present

## 2020-09-14 DIAGNOSIS — I10 Essential (primary) hypertension: Secondary | ICD-10-CM | POA: Diagnosis not present

## 2020-09-14 DIAGNOSIS — E119 Type 2 diabetes mellitus without complications: Secondary | ICD-10-CM | POA: Diagnosis not present

## 2020-09-14 DIAGNOSIS — F419 Anxiety disorder, unspecified: Secondary | ICD-10-CM | POA: Diagnosis not present

## 2020-09-14 DIAGNOSIS — K5909 Other constipation: Secondary | ICD-10-CM | POA: Diagnosis not present

## 2020-09-14 DIAGNOSIS — E669 Obesity, unspecified: Secondary | ICD-10-CM | POA: Diagnosis not present

## 2020-09-14 DIAGNOSIS — M25551 Pain in right hip: Secondary | ICD-10-CM | POA: Diagnosis not present

## 2020-09-14 DIAGNOSIS — E7800 Pure hypercholesterolemia, unspecified: Secondary | ICD-10-CM | POA: Diagnosis not present

## 2020-09-14 DIAGNOSIS — R748 Abnormal levels of other serum enzymes: Secondary | ICD-10-CM | POA: Diagnosis not present

## 2020-09-14 DIAGNOSIS — G629 Polyneuropathy, unspecified: Secondary | ICD-10-CM | POA: Diagnosis not present

## 2020-09-27 DIAGNOSIS — M25551 Pain in right hip: Secondary | ICD-10-CM | POA: Diagnosis not present

## 2020-09-27 DIAGNOSIS — R748 Abnormal levels of other serum enzymes: Secondary | ICD-10-CM | POA: Diagnosis not present

## 2020-09-27 DIAGNOSIS — M545 Low back pain, unspecified: Secondary | ICD-10-CM | POA: Diagnosis not present

## 2020-09-27 DIAGNOSIS — E7800 Pure hypercholesterolemia, unspecified: Secondary | ICD-10-CM | POA: Diagnosis not present

## 2020-09-27 DIAGNOSIS — F419 Anxiety disorder, unspecified: Secondary | ICD-10-CM | POA: Diagnosis not present

## 2020-09-27 DIAGNOSIS — G629 Polyneuropathy, unspecified: Secondary | ICD-10-CM | POA: Diagnosis not present

## 2020-09-27 DIAGNOSIS — I10 Essential (primary) hypertension: Secondary | ICD-10-CM | POA: Diagnosis not present

## 2020-09-27 DIAGNOSIS — K5909 Other constipation: Secondary | ICD-10-CM | POA: Diagnosis not present

## 2020-09-27 DIAGNOSIS — E119 Type 2 diabetes mellitus without complications: Secondary | ICD-10-CM | POA: Diagnosis not present

## 2020-10-25 DIAGNOSIS — F3341 Major depressive disorder, recurrent, in partial remission: Secondary | ICD-10-CM | POA: Diagnosis not present

## 2020-10-25 DIAGNOSIS — I10 Essential (primary) hypertension: Secondary | ICD-10-CM | POA: Diagnosis not present

## 2020-10-25 DIAGNOSIS — R748 Abnormal levels of other serum enzymes: Secondary | ICD-10-CM | POA: Diagnosis not present

## 2020-10-25 DIAGNOSIS — M545 Low back pain, unspecified: Secondary | ICD-10-CM | POA: Diagnosis not present

## 2020-10-25 DIAGNOSIS — M25552 Pain in left hip: Secondary | ICD-10-CM | POA: Diagnosis not present

## 2020-10-25 DIAGNOSIS — K5909 Other constipation: Secondary | ICD-10-CM | POA: Diagnosis not present

## 2020-10-25 DIAGNOSIS — G629 Polyneuropathy, unspecified: Secondary | ICD-10-CM | POA: Diagnosis not present

## 2020-10-25 DIAGNOSIS — F419 Anxiety disorder, unspecified: Secondary | ICD-10-CM | POA: Diagnosis not present

## 2020-10-25 DIAGNOSIS — F17200 Nicotine dependence, unspecified, uncomplicated: Secondary | ICD-10-CM | POA: Diagnosis not present

## 2020-11-19 ENCOUNTER — Emergency Department (HOSPITAL_COMMUNITY)
Admission: EM | Admit: 2020-11-19 | Discharge: 2020-11-20 | Disposition: A | Discharge status: Home / Self Care | Payer: Medicare HMO | Attending: Emergency Medicine | Admitting: Emergency Medicine

## 2020-11-19 ENCOUNTER — Emergency Department (HOSPITAL_COMMUNITY): Payer: Medicare HMO

## 2020-11-19 ENCOUNTER — Other Ambulatory Visit: Payer: Self-pay

## 2020-11-19 DIAGNOSIS — M1712 Unilateral primary osteoarthritis, left knee: Secondary | ICD-10-CM | POA: Diagnosis not present

## 2020-11-19 DIAGNOSIS — Z79899 Other long term (current) drug therapy: Secondary | ICD-10-CM | POA: Diagnosis not present

## 2020-11-19 DIAGNOSIS — E119 Type 2 diabetes mellitus without complications: Secondary | ICD-10-CM | POA: Insufficient documentation

## 2020-11-19 DIAGNOSIS — I10 Essential (primary) hypertension: Secondary | ICD-10-CM | POA: Insufficient documentation

## 2020-11-19 DIAGNOSIS — M25562 Pain in left knee: Secondary | ICD-10-CM

## 2020-11-19 DIAGNOSIS — Z7982 Long term (current) use of aspirin: Secondary | ICD-10-CM | POA: Insufficient documentation

## 2020-11-19 DIAGNOSIS — R52 Pain, unspecified: Secondary | ICD-10-CM

## 2020-11-19 DIAGNOSIS — Z794 Long term (current) use of insulin: Secondary | ICD-10-CM | POA: Diagnosis not present

## 2020-11-19 DIAGNOSIS — M79605 Pain in left leg: Secondary | ICD-10-CM | POA: Diagnosis not present

## 2020-11-19 MED ORDER — OXYCODONE-ACETAMINOPHEN 5-325 MG PO TABS
1.0000 | ORAL_TABLET | ORAL | Status: DC | PRN
  Administered 2020-11-19: 1 via ORAL
  Filled 2020-11-19: qty 1

## 2020-11-19 NOTE — ED Triage Notes (Signed)
Pt presents to ED POV. Pt c/o L leg pain. Pt reports pain is aching 10/10, began 2d ago. Pt reports taking Vicodin for pain w/o relief. pedal pulse +1. Pt able to bear partial weight.

## 2020-11-20 DIAGNOSIS — M25562 Pain in left knee: Secondary | ICD-10-CM | POA: Diagnosis not present

## 2020-11-20 MED ORDER — OXYCODONE-ACETAMINOPHEN 5-325 MG PO TABS
1.0000 | ORAL_TABLET | Freq: Once | ORAL | Status: AC
  Administered 2020-11-20: 1 via ORAL
  Filled 2020-11-20: qty 1

## 2020-11-20 MED ORDER — DICLOFENAC SODIUM 1 % EX GEL: 4.0000 g | Freq: Four times a day (QID) | CUTANEOUS | 0 refills | Status: DC

## 2020-11-20 NOTE — Discharge Instructions (Addendum)
Recommend using the crutches to be weight bearing as tolerated. Continue your regular pain medications as prescribed by your doctor, doubling the Norco if needed for severe pain. Use the Voltaren gel over the painful area on the front of the knee 4 times daily for additional relief.   Please call Dr. Kathline Magic office to schedule an appointment for further outpatient evaluation.   REturn to the emergency department with any severe pain, new swelling, fever or new concern.

## 2020-11-20 NOTE — Progress Notes (Signed)
Orthopedic Tech Progress Note Patient Details:  Angel Reynolds 1956-01-18 [id number]  Ortho Devices Type of Ortho Device: Crutches Ortho Device/Splint Interventions: Ordered,Application,Adjustment   Post Interventions Patient Tolerated: Well Instructions Provided: Care of device,Adjustment of device   Trinna Post 11/20/2020, 6:37 AM

## 2020-11-20 NOTE — ED Provider Notes (Signed)
[facility] EMERGENCY DEPARTMENT Provider Note   CSN: [id number] Arrival date & time: 11/19/20  2154     History Chief Complaint  Patient presents with  . Leg Pain    Angel Reynolds is a 65 y.o. female.  Patient to ED with complaint to left knee pain x 2 days. She describes anterior knee pain without injury or trauma, that progressed over 2 days to include posterior knee and thigh. No swelling or redness. No SOB, CP. She is on chronic Norco and Tramadol and is not getting any relief with these medications. She states the pain is some better with activity but results in increased soreness when the next day.   The history is provided by the patient. No language interpreter was used.  Leg Pain Associated symptoms: no fever        Past Medical History:  Diagnosis Date  . Diabetes mellitus    AODM  . Hyperlipidemia   . Hypertension   . Obesity     There are no problems to display for this patient.   OB History   No obstetric history on file.     No family history on file.     Home Medications Prior to Admission medications   Medication Sig Start Date End Date Taking? Authorizing Provider  aspirin 81 MG tablet Take 81 mg by mouth daily.      [provider]  benazepril-hydrochlorthiazide (LOTENSIN HCT) 20-12.5 MG per tablet Take 1 tablet by mouth daily.      [provider]  calcium-vitamin D (OSCAL WITH D) 250-125 MG-UNIT per tablet Take 1 tablet by mouth daily.      [provider]  fenofibrate micronized (LOFIBRA) 134 MG capsule Take 134 mg by mouth daily before breakfast.      [provider]  FLUoxetine (PROZAC) 40 MG capsule Take 1 capsule (40 mg total) by mouth daily. 10/11/11   Ronnald Nian, MD  Glucosamine-Chondroitin (COSAMIN DS PO) Take by mouth daily.      [provider]  Insulin Glargine (LANTUS [place]) Inject 25 Units into the skin at bedtime.      [provider]  Multiple  Vitamins-Minerals (MULTIVITAMIN WITH MINERALS) tablet Take 1 tablet by mouth daily.      [provider]  naproxen sodium (ANAPROX) 220 MG tablet Take 220 mg by mouth as needed.      [provider]    Allergies    Mobil-ease  Review of Systems   Review of Systems  Constitutional: Negative for chills and fever.  Respiratory: Negative.  Negative for shortness of breath.   Cardiovascular: Negative.  Negative for chest pain and leg swelling.  Musculoskeletal:       See HPI.  Skin: Negative.  Negative for color change.  Neurological: Negative.  Negative for weakness and numbness.    Physical Exam Updated Vital Signs BP 122/83 (BP Location: Left Arm)   Pulse 99   Temp 98.3 F (36.8 C) (Oral)   Resp 20   SpO2 100%   Physical Exam Vitals and nursing note reviewed.  Constitutional:      Appearance: Normal appearance. She is well-developed and well-nourished.  Pulmonary:     Effort: Pulmonary effort is normal.  Abdominal:     Tenderness: There is no abdominal tenderness.  Musculoskeletal:     Cervical back: Normal range of motion.       Legs:     Comments: There is focal tenderness to  the left knee over the proximal tibia. No swelling or discoloration. No palpable tenderness of posterior knee, thigh or calf. Distal pulses are palpable. Preserved FROM.   Skin:    General: Skin is warm and dry.  Neurological:     Mental Status: She is alert and oriented to person, place, and time.     ED Results / Procedures / Treatments   Labs (all labs ordered are listed, but only abnormal results are displayed) Labs Reviewed - No data to display  EKG None  Radiology DG Tibia/Fibula Left  Result Date: 11/19/2020 CLINICAL DATA:  Leg pain EXAM: LEFT TIBIA AND FIBULA - 2 VIEW COMPARISON:  None. FINDINGS: There is no evidence of fracture or other focal bone lesions. Soft tissues are unremarkable. Scattered vascular calcifications are noted. IMPRESSION: Negative.  Electronically Signed   By: Jonna Clark M.D.   On: 11/19/2020 23:29   DG Knee Complete 4 Views Left  Result Date: 11/19/2020 CLINICAL DATA:  Pain EXAM: LEFT KNEE - COMPLETE 4+ VIEW COMPARISON:  None. FINDINGS: No evidence of fracture, dislocation, or joint effusion. Tricompartmental osteoarthritis is seen advanced within the medial compartment joint space loss and marginal osteophyte formation. Scattered vascular calcifications are noted. Soft tissues are unremarkable. IMPRESSION: No acute osseous abnormality Tricompartmental osteoarthritis, advanced within the medial compartment. Electronically Signed   By: Jonna Clark M.D.   On: 11/19/2020 23:30   DG FEMUR MIN 2 VIEWS LEFT  Result Date: 11/19/2020 CLINICAL DATA:  Leg pain EXAM: LEFT FEMUR 2 VIEWS COMPARISON:  None. FINDINGS: There is no evidence of fracture or other focal bone lesions. Soft tissues are unremarkable. Overlying vascular calcifications are noted. IMPRESSION: Negative. Electronically Signed   By: Jonna Clark M.D.   On: 11/19/2020 23:29    Procedures Procedures (including critical care time)  Medications Ordered in ED Medications  oxyCODONE-acetaminophen (PERCOCET/ROXICET) 5-325 MG per tablet 1 tablet (1 tablet Oral Given 11/19/20 2248)    ED Course  I have reviewed the triage vital signs and the nursing notes.  Pertinent labs & imaging results that were available during my care of the patient were reviewed by me and considered in my medical decision making (see chart for details).    MDM Rules/Calculators/A&P                          Patient to ED with c/o left knee pain x 2 days, spreading to thigh. No injury, fever, redness.  Exam is unremarkable for objective findings. She is focally tender over tibial tubercle. No joint laxity. No calf tenderness or swelling to suggest DVT.  Patient given a percocet on arrival and reports this helped with pain initially. Imaging obtained of left femur knee and lower leg and are  negative for acute finding, c/w knee osteoarthritis.   Suggested crutches for weight bearing as tolerated. Topical Voltaren for pain relief and orthopedic follow up. She obtains regular prescriptions for Norco and Tramadol from PCP, not under pain contract. Recommended she could double her Norco dose if needed.    Final Clinical Impression(s) / ED Diagnoses Final diagnoses:  Pain   1. Left knee pain  Rx / DC Orders ED Discharge Orders    None       Elpidio Anis, PA-C 11/20/20 0559    Sabas Sous, MD 11/20/20 [phone]

## 2020-11-29 DIAGNOSIS — M1712 Unilateral primary osteoarthritis, left knee: Secondary | ICD-10-CM | POA: Diagnosis not present

## 2020-12-05 DIAGNOSIS — F419 Anxiety disorder, unspecified: Secondary | ICD-10-CM | POA: Diagnosis not present

## 2020-12-05 DIAGNOSIS — I10 Essential (primary) hypertension: Secondary | ICD-10-CM | POA: Diagnosis not present

## 2020-12-05 DIAGNOSIS — E1169 Type 2 diabetes mellitus with other specified complication: Secondary | ICD-10-CM | POA: Diagnosis not present

## 2020-12-05 DIAGNOSIS — G629 Polyneuropathy, unspecified: Secondary | ICD-10-CM | POA: Diagnosis not present

## 2020-12-05 DIAGNOSIS — K5909 Other constipation: Secondary | ICD-10-CM | POA: Diagnosis not present

## 2020-12-05 DIAGNOSIS — M545 Low back pain, unspecified: Secondary | ICD-10-CM | POA: Diagnosis not present

## 2020-12-05 DIAGNOSIS — F3341 Major depressive disorder, recurrent, in partial remission: Secondary | ICD-10-CM | POA: Diagnosis not present

## 2020-12-05 DIAGNOSIS — E669 Obesity, unspecified: Secondary | ICD-10-CM | POA: Diagnosis not present

## 2020-12-05 DIAGNOSIS — R748 Abnormal levels of other serum enzymes: Secondary | ICD-10-CM | POA: Diagnosis not present

## 2020-12-20 DIAGNOSIS — I10 Essential (primary) hypertension: Secondary | ICD-10-CM | POA: Diagnosis not present

## 2020-12-20 DIAGNOSIS — E7800 Pure hypercholesterolemia, unspecified: Secondary | ICD-10-CM | POA: Diagnosis not present

## 2020-12-20 DIAGNOSIS — G629 Polyneuropathy, unspecified: Secondary | ICD-10-CM | POA: Diagnosis not present

## 2020-12-20 DIAGNOSIS — M545 Low back pain, unspecified: Secondary | ICD-10-CM | POA: Diagnosis not present

## 2020-12-20 DIAGNOSIS — F3341 Major depressive disorder, recurrent, in partial remission: Secondary | ICD-10-CM | POA: Diagnosis not present

## 2020-12-20 DIAGNOSIS — K5909 Other constipation: Secondary | ICD-10-CM | POA: Diagnosis not present

## 2020-12-20 DIAGNOSIS — E669 Obesity, unspecified: Secondary | ICD-10-CM | POA: Diagnosis not present

## 2020-12-20 DIAGNOSIS — E1169 Type 2 diabetes mellitus with other specified complication: Secondary | ICD-10-CM | POA: Diagnosis not present

## 2020-12-20 DIAGNOSIS — F419 Anxiety disorder, unspecified: Secondary | ICD-10-CM | POA: Diagnosis not present

## 2020-12-20 DIAGNOSIS — R748 Abnormal levels of other serum enzymes: Secondary | ICD-10-CM | POA: Diagnosis not present

## 2021-01-06 DIAGNOSIS — M1712 Unilateral primary osteoarthritis, left knee: Secondary | ICD-10-CM | POA: Insufficient documentation

## 2021-01-06 HISTORY — DX: Unilateral primary osteoarthritis, left knee: M17.12

## 2021-01-17 ENCOUNTER — Encounter: Payer: Self-pay | Admitting: Emergency Medicine

## 2021-01-17 ENCOUNTER — Other Ambulatory Visit: Payer: Self-pay

## 2021-01-17 ENCOUNTER — Ambulatory Visit
Admission: EM | Admit: 2021-01-17 | Discharge: 2021-01-17 | Disposition: A | Discharge status: Home / Self Care | Payer: Medicare HMO | Attending: Emergency Medicine | Admitting: Emergency Medicine

## 2021-01-17 DIAGNOSIS — N611 Abscess of the breast and nipple: Secondary | ICD-10-CM | POA: Diagnosis not present

## 2021-01-17 MED ORDER — DOXYCYCLINE HYCLATE 100 MG PO CAPS: 100.0000 mg | ORAL_CAPSULE | Freq: Two times a day (BID) | ORAL | 0 refills | Status: AC

## 2021-01-17 MED ORDER — CEFTRIAXONE SODIUM 1 G IJ SOLR
1.0000 g | Freq: Once | INTRAMUSCULAR | Status: AC
  Administered 2021-01-17: 1 g via INTRAMUSCULAR

## 2021-01-17 NOTE — Discharge Instructions (Addendum)
We gave you a shot of Rocephin Begin doxycycline twice daily for 10 days Warm compresses Tylenol/ibuprofen for pain Please follow-up in 2 to 3 days if not seeing any improvement or symptoms worsening

## 2021-01-17 NOTE — ED Triage Notes (Signed)
Patient c/o abscess on RT breast that started 2 weeks ago.   Patient endorses " a clear, bloody drainage" that has been coming out of site.   Patient endorses that pain has progressively become worst.   Patient has used a medication to "draw fluid out" and heat pack with no relief of symptoms.

## 2021-01-17 NOTE — ED Provider Notes (Signed)
[name]    CSN: [id number] Arrival date & time: 01/17/21  0956      History   Chief Complaint Chief Complaint  Patient presents with  . Abscess    HPI Angel Reynolds is a 65 y.o. female history of hypertension, hyperlipidemia, DM type II presenting today for evaluation of an abscess.  Has developed abscess underneath right breast over the past 2 weeks.  Reports actively draining pus for the past 5 days.  Denies known fevers, but does report occasionally with hot and cold chills.  Has been applying warm compress, Betadine spray and changing dressing regularly.  HPI  Past Medical History:  Diagnosis Date  . Diabetes mellitus    AODM  . Hyperlipidemia   . Hypertension   . Obesity     There are no problems to display for this patient.   Past Surgical History:  Procedure Laterality Date  . ABDOMINAL HYSTERECTOMY      OB History   No obstetric history on file.      Home Medications    Prior to Admission medications   Medication Sig Start Date End Date Taking? Authorizing Provider  doxycycline (VIBRAMYCIN) 100 MG capsule Take 1 capsule (100 mg total) by mouth 2 (two) times daily for 10 days. 01/17/21 01/27/21 Yes Axcel Horsch C, PA-C  aspirin 81 MG tablet Take 81 mg by mouth daily.    [provider]  benazepril-hydrochlorthiazide (LOTENSIN HCT) 20-12.5 MG per tablet Take 1 tablet by mouth daily.    [provider]  calcium-vitamin D (OSCAL WITH D) 250-125 MG-UNIT per tablet Take 1 tablet by mouth daily.    [provider]  diclofenac Sodium (VOLTAREN) 1 % GEL Apply 4 g topically 4 (four) times daily. 11/20/20   Elpidio Anis, PA-C  fenofibrate micronized (LOFIBRA) 134 MG capsule Take 134 mg by mouth daily before breakfast.    [provider]  FLUoxetine (PROZAC) 40 MG capsule Take 1 capsule (40 mg total) by mouth daily. 10/11/11   Ronnald Nian, MD  Glucosamine-Chondroitin (COSAMIN DS PO) Take by mouth daily.     [provider]  Insulin Glargine (LANTUS [place]) Inject 25 Units into the skin at bedtime.    [provider]  Multiple Vitamins-Minerals (MULTIVITAMIN WITH MINERALS) tablet Take 1 tablet by mouth daily.    [provider]  naproxen sodium (ANAPROX) 220 MG tablet Take 220 mg by mouth as needed.    [provider]    Family History History reviewed. No pertinent family history.  Social History Social History   Tobacco Use  . Smoking status: Never Smoker  . Smokeless tobacco: Never Used  Vaping Use  . Vaping Use: Every day  . Substances: Nicotine     Allergies   Mobic [meloxicam]   Review of Systems Review of Systems  Constitutional: Negative for fatigue and fever.  Eyes: Negative for visual disturbance.  Respiratory: Negative for shortness of breath.   Cardiovascular: Negative for chest pain.  Gastrointestinal: Negative for abdominal pain, nausea and vomiting.  Musculoskeletal: Negative for arthralgias and joint swelling.  Skin: Positive for color change and wound. Negative for rash.  Neurological: Negative for dizziness, weakness, light-headedness and headaches.     Physical Exam Triage Vital Signs ED Triage Vitals  Enc Vitals Group     BP      Pulse      Resp      Temp      Temp src  SpO2      Weight      Height      Head Circumference      Peak Flow      Pain Score      Pain Loc      Pain Edu?      Excl. in GC?    No data found.  Updated Vital Signs BP 136/81 (BP Location: Left Arm)   Pulse 79   Temp 98.1 F (36.7 C) (Oral)   Resp 15   Ht 5\' 2"  (1.575 m)   Wt 179 lb (81.2 kg)   SpO2 94%   BMI 32.74 kg/m   Visual Acuity Right Eye Distance:   Left Eye Distance:   Bilateral Distance:    Right Eye Near:   Left Eye Near:    Bilateral Near:     Physical Exam Vitals and nursing note reviewed.  Constitutional:      Appearance: She is well-developed.     Comments: No acute distress  HENT:     Head:  Normocephalic and atraumatic.     Nose: Nose normal.  Eyes:     Conjunctiva/sclera: Conjunctivae normal.  Cardiovascular:     Rate and Rhythm: Normal rate.  Pulmonary:     Effort: Pulmonary effort is normal. No respiratory distress.  Abdominal:     General: There is no distension.  Musculoskeletal:        General: Normal range of motion.     Cervical back: Neck supple.  Skin:    General: Skin is warm and dry.     Comments: Inferior to right medial breast with area of erythema induration with multiple central pustular areas  Neurological:     Mental Status: She is alert and oriented to person, place, and time.      UC Treatments / Results  Labs (all labs ordered are listed, but only abnormal results are displayed) Labs Reviewed - No data to display  EKG   Radiology No results found.  Procedures Procedures (including critical care time)  Medications Ordered in UC Medications  cefTRIAXone (ROCEPHIN) injection 1 g (has no administration in time range)    Initial Impression / Assessment and Plan / UC Course  I have reviewed the triage vital signs and the nursing notes.  Pertinent labs & imaging results that were available during my care of the patient were reviewed by me and considered in my medical decision making (see chart for details).     Right breast abscess x2 weeks, already actively draining, deferring I&D given stage of healing and already draining, providing Rocephin prior to discharge and continuing on doxycycline x10 days, continue warm compresses.  Close monitoring over the next 2 to 3 days, return if not seeing any improvement or worsening despite use of antibiotics.  Discussed strict return precautions. Patient verbalized understanding and is agreeable with plan.  Final Clinical Impressions(s) / UC Diagnoses   Final diagnoses:  Abscess of right breast     Discharge Instructions     We gave you a shot of Rocephin Begin doxycycline twice daily for  10 days Warm compresses Tylenol/ibuprofen for pain Please follow-up in 2 to 3 days if not seeing any improvement or symptoms worsening    ED Prescriptions    Medication Sig Dispense Auth. Provider   doxycycline (VIBRAMYCIN) 100 MG capsule Take 1 capsule (100 mg total) by mouth 2 (two) times daily for 10 days. 20 capsule Zaide Mcclenahan, , PA-C     PDMP  not reviewed this encounter.   Lew Dawes, PA-C 01/17/21 1028

## 2021-01-23 ENCOUNTER — Other Ambulatory Visit: Payer: Self-pay

## 2021-01-23 ENCOUNTER — Ambulatory Visit
Admission: EM | Admit: 2021-01-23 | Discharge: 2021-01-23 | Disposition: A | Discharge status: Home / Self Care | Payer: Medicare HMO | Attending: Family Medicine | Admitting: Family Medicine

## 2021-01-23 DIAGNOSIS — L98499 Non-pressure chronic ulcer of skin of other sites with unspecified severity: Secondary | ICD-10-CM

## 2021-01-23 DIAGNOSIS — E11622 Type 2 diabetes mellitus with other skin ulcer: Secondary | ICD-10-CM

## 2021-01-23 DIAGNOSIS — L03313 Cellulitis of chest wall: Secondary | ICD-10-CM

## 2021-01-23 MED ORDER — DOXYCYCLINE HYCLATE 100 MG PO CAPS: 100.0000 mg | ORAL_CAPSULE | Freq: Two times a day (BID) | ORAL | 0 refills | Status: AC

## 2021-01-23 NOTE — ED Provider Notes (Signed)
[name]    CSN: [id number] Arrival date & time: 01/23/21  0831      History   Chief Complaint Chief Complaint  Patient presents with  . Abscess    HPI Angel Reynolds is a 65 y.o. female.   HPI  Patient presents today for evaluation of a skin abscess underneath her right breast.  Patient was seen here in clinic on 01/17/2021 placed on doxycycline advised to return if abscess did not improve.  She returns today as wound seems to have worsened since being on the antibiotics.  Patient is a type II diabetic.  She denies any fever continues to have pain.  Has not had any draining but noticed the center of the wound has a greenish type discharge to it.  She also notices that surrounding portion of the skin has become red and hardened. Denies fever or chills.   Past Medical History:  Diagnosis Date  . Diabetes mellitus    AODM  . Hyperlipidemia   . Hypertension   . Obesity     There are no problems to display for this patient.   Past Surgical History:  Procedure Laterality Date  . ABDOMINAL HYSTERECTOMY      OB History   No obstetric history on file.      Home Medications    Prior to Admission medications   Medication Sig Start Date End Date Taking? Authorizing Provider  aspirin 81 MG tablet Take 81 mg by mouth daily.    [provider]  benazepril-hydrochlorthiazide (LOTENSIN HCT) 20-12.5 MG per tablet Take 1 tablet by mouth daily.    [provider]  calcium-vitamin D (OSCAL WITH D) 250-125 MG-UNIT per tablet Take 1 tablet by mouth daily.    [provider]  diclofenac Sodium (VOLTAREN) 1 % GEL Apply 4 g topically 4 (four) times daily. 11/20/20   Elpidio Anis, PA-C  doxycycline (VIBRAMYCIN) 100 MG capsule Take 1 capsule (100 mg total) by mouth 2 (two) times daily for 10 days. 01/17/21 01/27/21  Wieters, Hallie C, PA-C  fenofibrate micronized (LOFIBRA) 134 MG capsule Take 134 mg by mouth daily before breakfast.    [provider]  FLUoxetine (PROZAC) 40 MG capsule Take 1 capsule (40 mg total) by mouth daily. 10/11/11   Ronnald Nian, MD  Glucosamine-Chondroitin (COSAMIN DS PO) Take by mouth daily.    [provider]  Insulin Glargine (LANTUS [place]) Inject 25 Units into the skin at bedtime.    [provider]  Multiple Vitamins-Minerals (MULTIVITAMIN WITH MINERALS) tablet Take 1 tablet by mouth daily.    [provider]  naproxen sodium (ANAPROX) 220 MG tablet Take 220 mg by mouth as needed.    [provider]    Family History History reviewed. No pertinent family history.  Social History Social History   Tobacco Use  . Smoking status: Current Every Day Smoker    Types: E-cigarettes  . Smokeless tobacco: Never Used  Vaping Use  . Vaping Use: Every day  . Substances: Nicotine  Substance Use Topics  . Alcohol use: Not Currently  . Drug use: Not Currently     Allergies   Mobic [meloxicam]   Review of Systems Review of Systems Pertinent negatives listed in HPI  Physical Exam Triage Vital Signs ED Triage Vitals  Enc Vitals Group     BP 01/23/21 0840 (!) 158/78     Pulse Rate 01/23/21 0840 90     Resp 01/23/21 0840 20  Temp 01/23/21 0840 97.6 F (36.4 C)     Temp Source 01/23/21 0840 Oral     SpO2 01/23/21 0840 97 %     Weight --      Height --      Head Circumference --      Peak Flow --      Pain Score 01/23/21 0858 4     Pain Loc --      Pain Edu? --      Excl. in GC? --    No data found.  Updated Vital Signs BP (!) 158/78 (BP Location: Left Arm)   Pulse 90   Temp 97.6 F (36.4 C) (Oral)   Resp 20   SpO2 97%   Visual Acuity Right Eye Distance:   Left Eye Distance:   Bilateral Distance:    Right Eye Near:   Left Eye Near:    Bilateral Near:     Physical Exam Constitutional:      Appearance: Normal appearance.  HENT:     Head: Normocephalic.  Cardiovascular:     Rate and Rhythm: Normal rate and regular rhythm.   Pulmonary:     Effort: Pulmonary effort is normal.  Chest:     Comments: Refer to picture for description  Skin:    Capillary Refill: Capillary refill takes less than 2 seconds.  Neurological:     General: No focal deficit present.     Mental Status: She is alert.  Psychiatric:        Mood and Affect: Mood normal.        Behavior: Behavior normal.        Thought Content: Thought content normal.        Judgment: Judgment normal.              UC Treatments / Results  Labs (all labs ordered are listed, but only abnormal results are displayed) Labs Reviewed - No data to display  EKG   Radiology No results found.  Procedures Procedures (including critical care time)  Medications Ordered in UC Medications - No data to display  Initial Impression / Assessment and Plan / UC Course  I have reviewed the triage vital signs and the nursing notes.  Pertinent labs & imaging results that were available during my care of the patient were reviewed by me and considered in my medical decision making (see chart for details).    Patient presents today for wound evaluation on exam patient has a stage III diabetic ulcer likely secondary to recurrent skin irritation below the right breast and pressure from breast.  Given patient is a type II diabetic concern for impaired and delayed wound healing therefore referred emergently to wound care for further evaluation and management of diabetic wound.  Referral placed to [facility].  Extended doxycycline additional 3 days as patient will take her last dose on Friday morning.  Advised to continue to monitor for signs of worsening infection as patient has significant redness extending beyond the ulceration.  Return precautions given. Final Clinical Impressions(s) / UC Diagnoses   Final diagnoses:  Diabetic skin ulcer associated with type 2 diabetes mellitus (HCC)  Cellulitis of chest wall     Discharge  Instructions     You have been referred to wound care, there office will contact you. Change dressing daily and or when dressing is soiled. Complete entire course of antibiotic.      ED Prescriptions    Medication Sig  Dispense Auth. Provider   doxycycline (VIBRAMYCIN) 100 MG capsule Take 1 capsule (100 mg total) by mouth 2 (two) times daily for 3 days. 6 capsule Bing Neighbors, FNP     PDMP not reviewed this encounter.   Bing Neighbors, FNP 01/23/21 1000

## 2021-01-23 NOTE — Discharge Instructions (Signed)
You have been referred to wound care, there office will contact you. Change dressing daily and or when dressing is soiled. Complete entire course of antibiotic.

## 2021-01-23 NOTE — ED Triage Notes (Signed)
Pt c/o abscess to rt breast x3wks. States seen here on 03/15 for same. States no changes and still draining a little.

## 2021-01-25 DIAGNOSIS — M545 Low back pain, unspecified: Secondary | ICD-10-CM | POA: Diagnosis not present

## 2021-01-25 DIAGNOSIS — R748 Abnormal levels of other serum enzymes: Secondary | ICD-10-CM | POA: Diagnosis not present

## 2021-01-25 DIAGNOSIS — Z6831 Body mass index (BMI) 31.0-31.9, adult: Secondary | ICD-10-CM | POA: Diagnosis not present

## 2021-01-25 DIAGNOSIS — E669 Obesity, unspecified: Secondary | ICD-10-CM | POA: Diagnosis not present

## 2021-01-25 DIAGNOSIS — G629 Polyneuropathy, unspecified: Secondary | ICD-10-CM | POA: Diagnosis not present

## 2021-01-25 DIAGNOSIS — I10 Essential (primary) hypertension: Secondary | ICD-10-CM | POA: Diagnosis not present

## 2021-01-25 DIAGNOSIS — F419 Anxiety disorder, unspecified: Secondary | ICD-10-CM | POA: Diagnosis not present

## 2021-01-25 DIAGNOSIS — K5909 Other constipation: Secondary | ICD-10-CM | POA: Diagnosis not present

## 2021-01-25 DIAGNOSIS — F3341 Major depressive disorder, recurrent, in partial remission: Secondary | ICD-10-CM | POA: Diagnosis not present

## 2021-01-27 ENCOUNTER — Encounter: Payer: Medicare HMO | Attending: Physician Assistant | Admitting: Physician Assistant

## 2021-01-27 ENCOUNTER — Other Ambulatory Visit: Payer: Self-pay

## 2021-01-27 ENCOUNTER — Other Ambulatory Visit
Admission: RE | Admit: 2021-01-27 | Discharge: 2021-01-27 | Disposition: A | Discharge status: Home / Self Care | Payer: Medicare HMO | Source: Ambulatory Visit | Attending: Physician Assistant | Admitting: Physician Assistant

## 2021-01-27 DIAGNOSIS — N611 Abscess of the breast and nipple: Secondary | ICD-10-CM | POA: Diagnosis not present

## 2021-01-27 DIAGNOSIS — I10 Essential (primary) hypertension: Secondary | ICD-10-CM | POA: Diagnosis not present

## 2021-01-27 DIAGNOSIS — Z886 Allergy status to analgesic agent status: Secondary | ICD-10-CM | POA: Diagnosis not present

## 2021-01-27 DIAGNOSIS — E11622 Type 2 diabetes mellitus with other skin ulcer: Secondary | ICD-10-CM | POA: Diagnosis not present

## 2021-01-27 DIAGNOSIS — F17200 Nicotine dependence, unspecified, uncomplicated: Secondary | ICD-10-CM | POA: Diagnosis not present

## 2021-01-27 DIAGNOSIS — N61 Mastitis without abscess: Secondary | ICD-10-CM | POA: Insufficient documentation

## 2021-01-27 DIAGNOSIS — L98492 Non-pressure chronic ulcer of skin of other sites with fat layer exposed: Secondary | ICD-10-CM | POA: Insufficient documentation

## 2021-01-27 DIAGNOSIS — S21001A Unspecified open wound of right breast, initial encounter: Secondary | ICD-10-CM | POA: Diagnosis not present

## 2021-01-27 NOTE — Progress Notes (Signed)
Angel, Reynolds ([id number]) Visit Report for 01/27/2021 Abuse/Suicide Risk Screen Details Patient Name: Angel Reynolds, Angel Reynolds. Date of Service: 01/27/2021 9:30 AM Medical Record Number: [id number] Patient Account Number: [id number] Date of Birth/Sex: [birth year] (65 y.o. F) Treating RN: Rogers Blocker Primary Care Morry Veiga: Simone Curia Other Clinician: Lolita Cram Referring Viggo Perko: Joaquin Courts Treating Joshuajames Moehring/Extender: Rowan Blase in Treatment: 0 Abuse/Suicide Risk Screen Items Answer ABUSE RISK SCREEN: Has anyone close to you tried to hurt or harm you recentlyo No Do you feel uncomfortable with anyone in your familyo No Has anyone forced you do things that you didnot want to doo No Electronic Signature(s) Signed: 01/27/2021 3:09:36 PM By: Phillis Haggis, Dondra Prader RN Entered By: Phillis Haggis, Dondra Prader on 01/27/2021 09:42:24 Angel Reynolds ([id number]) -------------------------------------------------------------------------------- Activities of Daily Living Details Patient Name: Angel Reynolds. Date of Service: 01/27/2021 9:30 AM Medical Record Number: [id number] Patient Account Number: [id number] Date of Birth/Sex: [birth year] (65 y.o. F) Treating RN: Rogers Blocker Primary Care Montel Vanderhoof: Simone Curia Other Clinician: Lolita Cram Referring Jolinda Pinkstaff: Joaquin Courts Treating Jonanthan Bolender/Extender: Rowan Blase in Treatment: 0 Activities of Daily Living Items Answer Activities of Daily Living (Please select one for each item) Drive Automobile Completely Able Take Medications Completely Able Use Telephone Completely Able Care for Appearance Completely Able Use Toilet Completely Able Bath / Shower Completely Able Dress Self Completely Able Feed Self Completely Able Walk Completely Able Get In / Out Bed Completely Able Housework Completely Able Prepare Meals Completely Able Handle Money Completely Able Shop for Self Completely Able Electronic  Signature(s) Signed: 01/27/2021 3:09:36 PM By: Phillis Haggis, Dondra Prader RN Entered By: Phillis Haggis, Dondra Prader on 01/27/2021 09:42:42 Angel Reynolds ([id number]) -------------------------------------------------------------------------------- Education Screening Details Patient Name: Angel Reynolds. Date of Service: 01/27/2021 9:30 AM Medical Record Number: [id number] Patient Account Number: [id number] Date of Birth/Sex: 1956-10-08 (65 y.o. F) Treating RN: Rogers Blocker Primary Care Izzy Doubek: Simone Curia Other Clinician: Lolita Cram Referring Yamina Lenis: Joaquin Courts Treating Amyra Vantuyl/Extender: Rowan Blase in Treatment: 0 Primary Learner Assessed: Patient Learning Preferences/Education Level/Primary Language Learning Preference: Explanation, Demonstration Highest Education Level: High School Preferred Language: English Cognitive Barrier Language Barrier: No Translator Needed: No Memory Deficit: No Emotional Barrier: No Cultural/Religious Beliefs Affecting Medical Care: No Physical Barrier Impaired Vision: No Impaired Hearing: No Knowledge/Comprehension Knowledge Level: Medium Comprehension Level: Medium Ability to understand written instructions: Medium Ability to understand verbal instructions: Medium Motivation Anxiety Level: Calm Cooperation: Cooperative Education Importance: Acknowledges Need Interest in Health Problems: Asks Questions Perception: Coherent Willingness to Engage in Self-Management High Activities: Readiness to Engage in Self-Management High Activities: Electronic Signature(s) Signed: 01/27/2021 3:09:36 PM By: Phillis Haggis, Dondra Prader RN Entered By: Phillis Haggis, Kenia on 01/27/2021 09:43:16 Angel Reynolds ([id number]) -------------------------------------------------------------------------------- Fall Risk Assessment Details Patient Name: Angel Reynolds. Date of Service: 01/27/2021 9:30 AM Medical Record Number:  [id number] Patient Account Number: [id number] Date of Birth/Sex: 1955/12/01 (65 y.o. F) Treating RN: Rogers Blocker Primary Care Mccayla Shimada: Simone Curia Other Clinician: Lolita Cram Referring Beverlie Kurihara: Joaquin Courts Treating Mariajose Mow/Extender: Rowan Blase in Treatment: 0 Fall Risk Assessment Items Have you had 2 or more falls in the last 12 monthso 0 No Have you had any fall that resulted in injury in the last 12 monthso 0 No FALLS RISK SCREEN History of falling - immediate or within 3 months 0 No Secondary diagnosis (Do you have 2 or more medical diagnoseso) 15 Yes Ambulatory aid None/bed rest/wheelchair/nurse 0 Yes Crutches/cane/walker 0 No Furniture 0 No Intravenous therapy Access/Saline/Heparin Lock 0 No Gait/Transferring  Normal/ bed rest/ wheelchair 0 Yes Weak (short steps with or without shuffle, stooped but able to lift head while walking, may 0 No seek support from furniture) Impaired (short steps with shuffle, may have difficulty arising from chair, head down, impaired 0 No balance) Mental Status Oriented to own ability 0 Yes Electronic Signature(s) Signed: 01/27/2021 3:09:36 PM By: Phillis Haggis, Dondra Prader RN Entered By: Phillis Haggis, Kenia on 01/27/2021 09:43:28 Angel Reynolds ([id number]) -------------------------------------------------------------------------------- Foot Assessment Details Patient Name: Angel Reynolds. Date of Service: 01/27/2021 9:30 AM Medical Record Number: [id number] Patient Account Number: [id number] Date of Birth/Sex: [birth year] (65 y.o. F) Treating RN: Rogers Blocker Primary Care Richad Ramsay: Simone Curia Other Clinician: Lolita Cram Referring Amaurie Wandel: Joaquin Courts Treating Mackinzee Roszak/Extender: Rowan Blase in Treatment: 0 Foot Assessment Items Site Locations + = Sensation present, - = Sensation absent, C = Callus, U = Ulcer R = Redness, W = Warmth, M = Maceration, PU = Pre-ulcerative lesion F = Fissure, S =  Swelling, D = Dryness Assessment Right: Left: Other Deformity: No No Prior Foot Ulcer: No No Prior Amputation: No No Charcot Joint: No No Ambulatory Status: Ambulatory Without Help Gait: Steady Electronic Signature(s) Signed: 01/27/2021 3:09:36 PM By: Phillis Haggis, Dondra Prader RN Entered By: Phillis Haggis, Dondra Prader on 01/27/2021 09:44:10 Angel Reynolds ([id number]) -------------------------------------------------------------------------------- Nutrition Risk Screening Details Patient Name: Angel Reynolds. Date of Service: 01/27/2021 9:30 AM Medical Record Number: [id number] Patient Account Number: [id number] Date of Birth/Sex: 10/27/1956 (64 y.o. F) Treating RN: Rogers Blocker Primary Care Nare Gaspari: Simone Curia Other Clinician: Lolita Cram Referring Corrinne Benegas: Joaquin Courts Treating Julie-Anne Torain/Extender: Rowan Blase in Treatment: 0 Height (in): 62 Weight (lbs): 177 Body Mass Index (BMI): 32.4 Nutrition Risk Screening Items Score Screening NUTRITION RISK SCREEN: I have an illness or condition that made me change the kind and/or amount of food I eat 0 No I eat fewer than two meals per day 0 No I eat few fruits and vegetables, or milk products 0 No I have three or more drinks of beer, liquor or wine almost every day 0 No I have tooth or mouth problems that make it hard for me to eat 0 No I don't always have enough money to buy the food I need 0 No I eat alone most of the time 0 No I take three or more different prescribed or over-the-counter drugs a day 1 Yes Without wanting to, I have lost or gained 10 pounds in the last six months 0 No I am not always physically able to shop, cook and/or feed myself 0 No Nutrition Protocols Good Risk Protocol 0 No interventions needed Moderate Risk Protocol High Risk Proctocol Risk Level: Good Risk Score: 1 Electronic Signature(s) Signed: 01/27/2021 3:09:36 PM By: Phillis Haggis, Dondra Prader RN Entered By: Phillis Haggis, Dondra Prader on  01/27/2021 09:44:04

## 2021-01-27 NOTE — Progress Notes (Signed)
TYRONE, BALASH ([id number]) Visit Report for 01/27/2021 Chief Complaint Document Details Patient Name: Angel Reynolds, Angel Reynolds. Date of Service: 01/27/2021 9:30 AM Medical Record Number: [id number] Patient Account Number: [id number] Date of Birth/Sex: 1955/12/23 (65 y.o. F) Treating RN: Carlene Coria Primary Care Provider: Cher Nakai Other Clinician: Jeanine Luz Referring Provider: Molli Barrows Treating Provider/Extender: Skipper Cliche in Treatment: 0 Information Obtained from: Patient Chief Complaint Right breast cellulitis abscess Electronic Signature(s) Signed: 01/27/2021 10:02:34 AM By: Worthy Keeler PA-C Entered By: Worthy Keeler on 01/27/2021 10:02:34 Angel Reynolds ([id number]) -------------------------------------------------------------------------------- Debridement Details Patient Name: Angel Reynolds. Date of Service: 01/27/2021 9:30 AM Medical Record Number: [id number] Patient Account Number: [id number] Date of Birth/Sex: 1956/05/10 (65 y.o. F) Treating RN: Cornell Barman Primary Care Provider: Cher Nakai Other Clinician: Jeanine Luz Referring Provider: Molli Barrows Treating Provider/Extender: Skipper Cliche in Treatment: 0 Debridement Performed for Wound #1 Right Breast Assessment: Performed By: Physician Tommie Sams., PA-C Debridement Type: Debridement Level of Consciousness (Pre- Awake and Alert procedure): Pre-procedure Verification/Time Out Yes - 10:29 Taken: Start Time: 10:29 Pain Control: Lidocaine 4% Topical Solution Total Area Debrided (L x W): 1.2 (cm) x 3.7 (cm) = 4.44 (cm) Tissue and other material Viable, Non-Viable, Slough, Subcutaneous, Skin: Dermis , Skin: Epidermis, Slough debrided: Level: Skin/Subcutaneous Tissue Debridement Description: Excisional Instrument: Forceps, Scissors Specimen: Tissue Culture Number of Specimens Taken: 1 Bleeding: Minimum Hemostasis Achieved: Pressure End Time: 10:34 Procedural Pain:  0 Post Procedural Pain: 0 Response to Treatment: Procedure was tolerated well Level of Consciousness (Post- Awake and Alert procedure): Post Debridement Measurements of Total Wound Length: (cm) 1.2 Width: (cm) 3.7 Depth: (cm) 0.6 Volume: (cm) 2.092 Character of Wound/Ulcer Post Debridement: Improved Post Procedure Diagnosis Same as Pre-procedure Electronic Signature(s) Signed: 01/27/2021 2:05:44 PM By: Worthy Keeler PA-C Signed: 01/27/2021 4:18:52 PM By: Gretta Cool, BSN, RN, CWS, Kim RN, BSN Entered By: Gretta Cool, BSN, RN, CWS, Kim on 01/27/2021 10:53:27 Angel Reynolds ([id number]) -------------------------------------------------------------------------------- HPI Details Patient Name: Angel Reynolds. Date of Service: 01/27/2021 9:30 AM Medical Record Number: [id number] Patient Account Number: [id number] Date of Birth/Sex: 04-06-1956 (65 y.o. F) Treating RN: Carlene Coria Primary Care Provider: Cher Nakai Other Clinician: Jeanine Luz Referring Provider: Molli Barrows Treating Provider/Extender: Skipper Cliche in Treatment: 0 History of Present Illness HPI Description: 01/27/2021 upon evaluation today patient appears to be doing somewhat poorly in regard to an abscess over the area just inferior to her right breast. Subsequently she tells me that this started about a month ago initially it was a raised/bumped area that then she put some drawing salve on it and it began to drain. Subsequently she did see her primary care provider and she was placed initially on a 10-day course of doxycycline which then was extended so that she would have this for at least 14 days maybe a little longer I am not sure when the first course was given to her. Nonetheless she has enough to make it through till Monday which is good news. Nonetheless unfortunately she tells me that this is still giving her quite a bit of discomfort is also draining quite a bit she just put a little bit of antibiotic  ointment on it and covering with a dressing if anything. With that being said I think that this probably represents something needs to be cleaned out probably is significantly deeper than what is obvious just on initial inspection. The patient does have a history of diabetes mellitus type 2, hypertension, and otherwise tells  me she is never had anything quite like this happen before. Electronic Signature(s) Signed: 01/27/2021 12:50:00 PM By: Worthy Keeler PA-C Entered By: Worthy Keeler on 01/27/2021 12:49:59 Angel Reynolds ([id number]) -------------------------------------------------------------------------------- Physical Exam Details Patient Name: Angel Reynolds. Date of Service: 01/27/2021 9:30 AM Medical Record Number: [id number] Patient Account Number: [id number] Date of Birth/Sex: [birth year] (65 y.o. F) Treating RN: Carlene Coria Primary Care Provider: Cher Nakai Other Clinician: Jeanine Luz Referring Provider: Molli Barrows Treating Provider/Extender: Skipper Cliche in Treatment: 0 Constitutional sitting or standing blood pressure is within target range for patient.. pulse regular and within target range for patient.Marland Kitchen respirations regular, non- labored and within target range for patient.Marland Kitchen temperature within target range for patient.. Well-nourished and well-hydrated in no acute distress. Eyes conjunctiva clear no eyelid edema noted. pupils equal round and reactive to light and accommodation. Ears, Nose, Mouth, and Throat no gross abnormality of ear auricles or external auditory canals. normal hearing noted during conversation. mucus membranes moist. Respiratory normal breathing without difficulty. Musculoskeletal normal gait and posture. no significant deformity or arthritic changes, no loss or range of motion, no clubbing. Psychiatric this patient is able to make decisions and demonstrates good insight into disease process. Alert and Oriented x 3. pleasant and  cooperative. Notes Upon inspection patient's wound bed did require sharp debridement I cleared away necrotic tissue down to good subcutaneous tissue and in fact found a fluid-filled pocket which there was purulent drainage collected within. Subsequently we did culture this drainage despite she is on the antibiotic 1 make sure she is on the right antibiotic. Subsequently I am going to send that for culture and then subsequently also we can end up packing this area. She tolerated the debridement really minimal discomfort and post debridement wound bed appears to be doing significantly better which is great news. I see no signs of worsening infection I think she is probably on the right side of getting this better. Electronic Signature(s) Signed: 01/27/2021 12:50:45 PM By: Worthy Keeler PA-C Entered By: Worthy Keeler on 01/27/2021 12:50:45 Angel Reynolds ([id number]) -------------------------------------------------------------------------------- Physician Orders Details Patient Name: Angel Reynolds. Date of Service: 01/27/2021 9:30 AM Medical Record Number: [id number] Patient Account Number: [id number] Date of Birth/Sex: 1956-07-11 (65 y.o. F) Treating RN: Carlene Coria Primary Care Provider: Cher Nakai Other Clinician: Jeanine Luz Referring Provider: Molli Barrows Treating Provider/Extender: Skipper Cliche in Treatment: 0 Verbal / Phone Orders: No Diagnosis Coding ICD-10 Coding Code Description L02.818 Cutaneous abscess of other sites L98.492 Non-pressure chronic ulcer of skin of other sites with fat layer exposed I10 Essential (primary) hypertension E11.622 Type 2 diabetes mellitus with other skin ulcer Follow-up Appointments o Return Appointment in 1 week. Wound Treatment Wound #1 - Breast Wound Laterality: Right Cleanser: Byram Ancillary Kit - 15 Day Supply (DME) (Generic) 1 x Per Day/30 Days Discharge Instructions: Use supplies as instructed; Kit contains: (15)  Saline Bullets; (15) 3x3 Gauze; 15 pr Gloves Cleanser: Normal Saline (DME) (Generic) 1 x Per Day/30 Days Discharge Instructions: Wash your hands with soap and water. Remove old dressing, discard into plastic bag and place into trash. Cleanse the wound with Normal Saline prior to applying a clean dressing using gauze sponges, not tissues or cotton balls. Do not scrub or use excessive force. Pat dry using gauze sponges, not tissue or cotton balls. Primary Dressing: Gauze (DME) (Generic) 1 x Per Day/30 Days Discharge Instructions: moistened with Dakins Solution 1/4 strength Secondary Dressing: Bordered Gauze Sterile-HBD 4x4 (in/in) (DME) (  Generic) 1 x Per Day/30 Days Discharge Instructions: Cover wound with Bordered Guaze Sterile as directed Laboratory o Bacteria identified in Wound by Culture (MICRO) - non healing wound with pain, swelling redness - (ICD10 L02.818 - Cutaneous abscess of other sites) oooo LOINC Code: 4944-9 oooo Convenience Name: Wound culture routine Patient Medications Allergies: Mobic Notifications Medication Indication Start End Dakin's Solution 01/27/2021 DOSE miscellaneous 0.25 % solution - solution, moisten gauze and lightly pack into the wound below the right breast daily then cover with a dressing as directed in clinic Electronic Signature(s) Signed: 01/27/2021 10:45:15 AM By: Worthy Keeler PA-C Entered By: Worthy Keeler on 01/27/2021 10:45:15 Angel Reynolds ([id number]) -------------------------------------------------------------------------------- Problem List Details Patient Name: Angel Reynolds. Date of Service: 01/27/2021 9:30 AM Medical Record Number: [id number] Patient Account Number: [id number] Date of Birth/Sex: Aug 14, 1956 (65 y.o. F) Treating RN: Carlene Coria Primary Care Provider: Cher Nakai Other Clinician: Jeanine Luz Referring Provider: Molli Barrows Treating Provider/Extender: Skipper Cliche in Treatment: 0 Active  Problems ICD-10 Encounter Code Description Active Date MDM Diagnosis L02.818 Cutaneous abscess of other sites 01/27/2021 No Yes L98.492 Non-pressure chronic ulcer of skin of other sites with fat layer exposed 01/27/2021 No Yes I10 Essential (primary) hypertension 01/27/2021 No Yes E11.622 Type 2 diabetes mellitus with other skin ulcer 01/27/2021 No Yes Inactive Problems Resolved Problems Electronic Signature(s) Signed: 01/27/2021 10:02:13 AM By: Worthy Keeler PA-C Entered By: Worthy Keeler on 01/27/2021 10:02:13 Angel Reynolds ([id number]) -------------------------------------------------------------------------------- Progress Note Details Patient Name: Angel Reynolds. Date of Service: 01/27/2021 9:30 AM Medical Record Number: [id number] Patient Account Number: [id number] Date of Birth/Sex: 1956-09-14 (65 y.o. F) Treating RN: Carlene Coria Primary Care Provider: Cher Nakai Other Clinician: Jeanine Luz Referring Provider: Molli Barrows Treating Provider/Extender: Skipper Cliche in Treatment: 0 Subjective Chief Complaint Information obtained from Patient Right breast cellulitis abscess History of Present Illness (HPI) 01/27/2021 upon evaluation today patient appears to be doing somewhat poorly in regard to an abscess over the area just inferior to her right breast. Subsequently she tells me that this started about a month ago initially it was a raised/bumped area that then she put some drawing salve on it and it began to drain. Subsequently she did see her primary care provider and she was placed initially on a 10-day course of doxycycline which then was extended so that she would have this for at least 14 days maybe a little longer I am not sure when the first course was given to her. Nonetheless she has enough to make it through till Monday which is good news. Nonetheless unfortunately she tells me that this is still giving her quite a bit of discomfort is also draining  quite a bit she just put a little bit of antibiotic ointment on it and covering with a dressing if anything. With that being said I think that this probably represents something needs to be cleaned out probably is significantly deeper than what is obvious just on initial inspection. The patient does have a history of diabetes mellitus type 2, hypertension, and otherwise tells me she is never had anything quite like this happen before. Patient History Information obtained from Patient. Allergies Mobic Social History Current every day smoker, Marital Status - Married, Alcohol Use - Rarely, Drug Use - No History, Caffeine Use - Daily. Medical History Eyes Denies history of Cataracts, Glaucoma, Optic Neuritis Ear/Nose/Mouth/Throat Denies history of Chronic sinus problems/congestion, Middle ear problems Hematologic/Lymphatic Denies history of Anemia, Hemophilia, Human Immunodeficiency Virus, Lymphedema,  Sickle Cell Disease Respiratory Denies history of Aspiration, Asthma, Chronic Obstructive Pulmonary Disease (COPD), Pneumothorax, Sleep Apnea, Tuberculosis Cardiovascular Patient has history of Hypertension Denies history of Angina, Arrhythmia, Congestive Heart Failure, Coronary Artery Disease, Deep Vein Thrombosis, Hypotension, Myocardial Infarction, Peripheral Arterial Disease, Peripheral Venous Disease, Phlebitis, Vasculitis Gastrointestinal Denies history of Cirrhosis , Colitis, Crohn s, Hepatitis A, Hepatitis B, Hepatitis C Endocrine Patient has history of Type II Diabetes Denies history of Type I Diabetes Genitourinary Denies history of End Stage Renal Disease Immunological Denies history of Lupus Erythematosus, Raynaud s, Scleroderma Integumentary (Skin) Denies history of History of Burn, History of pressure wounds Musculoskeletal Patient has history of Osteoarthritis Denies history of Gout, Rheumatoid Arthritis, Osteomyelitis Neurologic Denies history of Dementia, Neuropathy,  Quadriplegia, Paraplegia, Seizure Disorder Oncologic Denies history of Received Chemotherapy, Received Radiation Psychiatric Denies history of Anorexia/bulimia, Confinement Anxiety Patient is treated with Oral Agents. Review of Systems (ROS) Constitutional Symptoms (General Health) Denies complaints or symptoms of Fatigue, Fever, Chills, Marked Weight Change. Angel Reynolds ([id number]) Eyes Denies complaints or symptoms of Dry Eyes, Vision Changes, Glasses / Contacts. Ear/Nose/Mouth/Throat Denies complaints or symptoms of Difficult clearing ears, Sinusitis. Hematologic/Lymphatic Denies complaints or symptoms of Bleeding / Clotting Disorders, Human Immunodeficiency Virus. Respiratory Denies complaints or symptoms of Chronic or frequent coughs, Shortness of Breath. Cardiovascular Denies complaints or symptoms of Chest pain, LE edema. Gastrointestinal Denies complaints or symptoms of Frequent diarrhea, Nausea, Vomiting. Endocrine Denies complaints or symptoms of Hepatitis, Thyroid disease, Polydypsia (Excessive Thirst). Genitourinary Denies complaints or symptoms of Kidney failure/ Dialysis, Incontinence/dribbling. Immunological Denies complaints or symptoms of Hives, Itching. Integumentary (Skin) Complains or has symptoms of Wounds. Denies complaints or symptoms of Bleeding or bruising tendency, Breakdown, Swelling. Musculoskeletal Denies complaints or symptoms of Muscle Pain, Muscle Weakness. Neurologic Denies complaints or symptoms of Numbness/parasthesias, Focal/Weakness. Psychiatric Denies complaints or symptoms of Anxiety, Claustrophobia. Objective Constitutional sitting or standing blood pressure is within target range for patient.. pulse regular and within target range for patient.Marland Kitchen respirations regular, non- labored and within target range for patient.Marland Kitchen temperature within target range for patient.. Well-nourished and well-hydrated in no acute distress. Vitals Time  Taken: 9:35 AM, Height: 62 in, Source: Measured, Weight: 177 lbs, BMI: 32.4, Temperature: 98.3 F, Pulse: 84 bpm, Respiratory Rate: 18 breaths/min, Blood Pressure: 111/71 mmHg. Eyes conjunctiva clear no eyelid edema noted. pupils equal round and reactive to light and accommodation. Ears, Nose, Mouth, and Throat no gross abnormality of ear auricles or external auditory canals. normal hearing noted during conversation. mucus membranes moist. Respiratory normal breathing without difficulty. Musculoskeletal normal gait and posture. no significant deformity or arthritic changes, no loss or range of motion, no clubbing. Psychiatric this patient is able to make decisions and demonstrates good insight into disease process. Alert and Oriented x 3. pleasant and cooperative. General Notes: Upon inspection patient's wound bed did require sharp debridement I cleared away necrotic tissue down to good subcutaneous tissue and in fact found a fluid-filled pocket which there was purulent drainage collected within. Subsequently we did culture this drainage despite she is on the antibiotic 1 make sure she is on the right antibiotic. Subsequently I am going to send that for culture and then subsequently also we can end up packing this area. She tolerated the debridement really minimal discomfort and post debridement wound bed appears to be doing significantly better which is great news. I see no signs of worsening infection I think she is probably on the right side of getting this better. Integumentary (Hair, Skin)  Wound #1 status is Open. Original cause of wound was Gradually Appeared. The date acquired was: 12/30/2020. The wound is located on the Right Breast. The wound measures 1.2cm length x 3.7cm width x 0.6cm depth; 3.487cm^2 area and 2.092cm^3 volume. There is Fat Layer (Subcutaneous Tissue) exposed. There is no tunneling noted, however, there is undermining starting at 12:00 and ending at 12:00 with  a maximum distance of 1.2cm. There is a medium amount of serosanguineous drainage noted. There is no granulation within the wound bed. There is a large (67-100%) amount of necrotic tissue within the wound bed. JACQUILYN, SELDON ([id number]) Assessment Active Problems ICD-10 Cutaneous abscess of other sites Non-pressure chronic ulcer of skin of other sites with fat layer exposed Essential (primary) hypertension Type 2 diabetes mellitus with other skin ulcer Procedures Wound #1 Pre-procedure diagnosis of Wound #1 is an Abscess located on the Right Breast . There was a Excisional Skin/Subcutaneous Tissue Debridement with a total area of 4.44 sq cm performed by Tommie Sams., PA-C. With the following instrument(s): Forceps, and Scissors to remove Viable and Non-Viable tissue/material. Material removed includes Subcutaneous Tissue, Slough, Skin: Dermis, and Skin: Epidermis after achieving pain control using Lidocaine 4% Topical Solution. 1 specimen was taken by a Tissue Culture and sent to the lab per facility protocol. A time out was conducted at 10:29, prior to the start of the procedure. A Minimum amount of bleeding was controlled with Pressure. The procedure was tolerated well with a pain level of 0 throughout and a pain level of 0 following the procedure. Post Debridement Measurements: 1.2cm length x 3.7cm width x 0.6cm depth; 2.092cm^3 volume. Character of Wound/Ulcer Post Debridement is improved. Post procedure Diagnosis Wound #1: Same as Pre-Procedure Plan Follow-up Appointments: Return Appointment in 1 week. Laboratory ordered were: Wound culture routine - non healing wound with pain, swelling redness The following medication(s) was prescribed: Dakin's Solution miscellaneous 0.25 % solution solution, moisten gauze and lightly pack into the wound below the right breast daily then cover with a dressing as directed in clinic starting 01/27/2021 WOUND #1: - Breast Wound Laterality:  Right Cleanser: Byram Ancillary Kit - 15 Day Supply (DME) (Generic) 1 x Per Day/30 Days Discharge Instructions: Use supplies as instructed; Kit contains: (15) Saline Bullets; (15) 3x3 Gauze; 15 pr Gloves Cleanser: Normal Saline (DME) (Generic) 1 x Per Day/30 Days Discharge Instructions: Wash your hands with soap and water. Remove old dressing, discard into plastic bag and place into trash. Cleanse the wound with Normal Saline prior to applying a clean dressing using gauze sponges, not tissues or cotton balls. Do not scrub or use excessive force. Pat dry using gauze sponges, not tissue or cotton balls. Primary Dressing: Gauze (DME) (Generic) 1 x Per Day/30 Days Discharge Instructions: moistened with Dakins Solution 1/4 strength Secondary Dressing: Bordered Gauze Sterile-HBD 4x4 (in/in) (DME) (Generic) 1 x Per Day/30 Days Discharge Instructions: Cover wound with Bordered Guaze Sterile as directed 1. Would recommend currently that we go ahead and initiate treatment with a Dakin's moistened gauze dressing packed into the wound bed lightly. I think this is good to help her quite significantly from the standpoint of getting it to granulate in. 2. I am also can recommend the patient continue to monitor for any signs of worsening infection. I think now that we have this open it should slowly get better and better I do think she is going to continue to have any significant or major issues here. We will see patient back for reevaluation  in 1 week here in the clinic. If anything worsens or changes patient will contact our office for additional recommendations. Electronic Signature(s) Signed: 01/27/2021 12:52:07 PM By: Worthy Keeler PA-C Entered By: Worthy Keeler on 01/27/2021 12:52:07 Angel Reynolds ([id number]) -------------------------------------------------------------------------------- ROS/PFSH Details Patient Name: Angel Reynolds. Date of Service: 01/27/2021 9:30 AM Medical Record Number:  [id number] Patient Account Number: [id number] Date of Birth/Sex: 02/21/1956 (65 y.o. F) Treating RN: Dolan Amen Primary Care Provider: Cher Nakai Other Clinician: Jeanine Luz Referring Provider: Molli Barrows Treating Provider/Extender: Skipper Cliche in Treatment: 0 Information Obtained From Patient Constitutional Symptoms (General Health) Complaints and Symptoms: Negative for: Fatigue; Fever; Chills; Marked Weight Change Eyes Complaints and Symptoms: Negative for: Dry Eyes; Vision Changes; Glasses / Contacts Medical History: Negative for: Cataracts; Glaucoma; Optic Neuritis Ear/Nose/Mouth/Throat Complaints and Symptoms: Negative for: Difficult clearing ears; Sinusitis Medical History: Negative for: Chronic sinus problems/congestion; Middle ear problems Hematologic/Lymphatic Complaints and Symptoms: Negative for: Bleeding / Clotting Disorders; Human Immunodeficiency Virus Medical History: Negative for: Anemia; Hemophilia; Human Immunodeficiency Virus; Lymphedema; Sickle Cell Disease Respiratory Complaints and Symptoms: Negative for: Chronic or frequent coughs; Shortness of Breath Medical History: Negative for: Aspiration; Asthma; Chronic Obstructive Pulmonary Disease (COPD); Pneumothorax; Sleep Apnea; Tuberculosis Cardiovascular Complaints and Symptoms: Negative for: Chest pain; LE edema Medical History: Positive for: Hypertension Negative for: Angina; Arrhythmia; Congestive Heart Failure; Coronary Artery Disease; Deep Vein Thrombosis; Hypotension; Myocardial Infarction; Peripheral Arterial Disease; Peripheral Venous Disease; Phlebitis; Vasculitis Gastrointestinal Complaints and Symptoms: Negative for: Frequent diarrhea; Nausea; Vomiting Medical History: Negative for: Cirrhosis ; Colitis; Crohnos; Hepatitis A; Hepatitis B; Hepatitis C Endocrine NIKITA, SURMAN. ([id number]) Complaints and Symptoms: Negative for: Hepatitis; Thyroid disease; Polydypsia  (Excessive Thirst) Medical History: Positive for: Type II Diabetes Negative for: Type I Diabetes Time with diabetes: 20 years Treated with: Oral agents Genitourinary Complaints and Symptoms: Negative for: Kidney failure/ Dialysis; Incontinence/dribbling Medical History: Negative for: End Stage Renal Disease Immunological Complaints and Symptoms: Negative for: Hives; Itching Medical History: Negative for: Lupus Erythematosus; Raynaudos; Scleroderma Integumentary (Skin) Complaints and Symptoms: Positive for: Wounds Negative for: Bleeding or bruising tendency; Breakdown; Swelling Medical History: Negative for: History of Burn; History of pressure wounds Musculoskeletal Complaints and Symptoms: Negative for: Muscle Pain; Muscle Weakness Medical History: Positive for: Osteoarthritis Negative for: Gout; Rheumatoid Arthritis; Osteomyelitis Neurologic Complaints and Symptoms: Negative for: Numbness/parasthesias; Focal/Weakness Medical History: Negative for: Dementia; Neuropathy; Quadriplegia; Paraplegia; Seizure Disorder Psychiatric Complaints and Symptoms: Negative for: Anxiety; Claustrophobia Medical History: Negative for: Anorexia/bulimia; Confinement Anxiety Oncologic Medical History: Negative for: Received Chemotherapy; Received Radiation Immunizations Pneumococcal Vaccine: Received Pneumococcal Vaccination: Yes Implantable Devices HIMANI, CORONA ([id number]) None Family and Social History Current every day smoker; Marital Status - Married; Alcohol Use: Rarely; Drug Use: No History; Caffeine Use: Daily Electronic Signature(s) Signed: 01/27/2021 2:05:44 PM By: Worthy Keeler PA-C Signed: 01/27/2021 3:09:36 PM By: Georges Mouse, Minus Breeding RN Entered By: Georges Mouse, Kenia on 01/27/2021 09:42:17 Angel Reynolds ([id number]) -------------------------------------------------------------------------------- SuperBill Details Patient Name: Angel Reynolds. Date of  Service: 01/27/2021 Medical Record Number: [id number] Patient Account Number: [id number] Date of Birth/Sex: May 06, 1956 (65 y.o. F) Treating RN: Carlene Coria Primary Care Provider: Cher Nakai Other Clinician: Jeanine Luz Referring Provider: Molli Barrows Treating Provider/Extender: Skipper Cliche in Treatment: 0 Diagnosis Coding ICD-10 Codes Code Description [phone] Cutaneous abscess of other sites L98.492 Non-pressure chronic ulcer of skin of other sites with fat layer exposed I10 Essential (primary) hypertension E11.622 Type 2 diabetes mellitus with other skin ulcer Facility Procedures CPT4 Code:  25638937 Description: 99213 - WOUND CARE VISIT-LEV 3 EST PT Modifier: 25 Quantity: 1 CPT4 Code: 34287681 Description: 15726 - DEB SUBQ TISSUE 20 SQ CM/< Modifier: Quantity: 1 CPT4 Code: Description: ICD-10 Diagnosis Description L02.818 Cutaneous abscess of other sites L98.492 Non-pressure chronic ulcer of skin of other sites with fat layer expose Modifier: d Quantity: Physician Procedures CPT4 Code: 2035597 Description: 41638 - WC PHYS LEVEL 4 - NEW PT Modifier: 25 Quantity: 1 CPT4 Code: Description: ICD-10 Diagnosis Description L02.818 Cutaneous abscess of other sites L98.492 Non-pressure chronic ulcer of skin of other sites with fat layer expose I10 Essential (primary) hypertension E11.622 Type 2 diabetes mellitus with other skin ulcer Modifier: d Quantity: CPT4 Code: 4536468 Description: 03212 - WC PHYS SUBQ TISS 20 SQ CM Modifier: Quantity: 1 CPT4 Code: Description: ICD-10 Diagnosis Description L02.818 Cutaneous abscess of other sites 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

## 2021-01-30 LAB — AEROBIC CULTURE W GRAM STAIN (SUPERFICIAL SPECIMEN)

## 2021-01-30 NOTE — Progress Notes (Signed)
as needed Assess impairment of mobility on admission and as needed per policy Assess personal safety and home safety (as indicated) on admission and as needed Assess self care needs on admission and as needed Notes: Nutrition Nursing Diagnoses: Impaired glucose control: actual or potential Goals: Patient/caregiver verbalizes understanding of need to maintain therapeutic glucose control per primary care physician Date Initiated: 01/27/2021 Target Resolution Date: 03/02/2021 Goal Status: Active Interventions: Assess HgA1c results as ordered upon admission and as needed Assess patient nutrition upon admission and as needed per policy Notes: Wound/Skin Impairment Nursing Diagnoses: Knowledge deficit related to ulceration/compromised skin integrity Goals: Patient/caregiver will verbalize understanding of skin care regimen Date Initiated: 01/27/2021 Target Resolution Date:  02/27/2021 Goal Status: Active Ulcer/skin breakdown will have a volume reduction of 30% by week 4 Date Initiated: 01/27/2021 Target Resolution Date: 03/29/2021 Goal Status: Active Ulcer/skin breakdown will have a volume reduction of 50% by week 8 Date Initiated: 01/27/2021 Target Resolution Date: 04/29/2021 Goal Status: Active Ulcer/skin breakdown will have a volume reduction of 80% by week 12 Date Initiated: 01/27/2021 Target Resolution Date: 05/29/2021 CALIANN, LECKRONE ([id number]) Goal Status: Active Ulcer/skin breakdown will heal within 14 weeks Date Initiated: 01/27/2021 Target Resolution Date: 06/29/2021 Goal Status: Active Interventions: Assess patient/caregiver ability to obtain necessary supplies Assess patient/caregiver ability to perform ulcer/skin care regimen upon admission and as needed Assess ulceration(s) every visit Notes: Electronic Signature(s) Signed: 01/30/2021 5:08:01 PM By: Carlene Coria RN Entered By: Carlene Coria on 01/27/2021 10:29:04 Angel Reynolds ([id number]) -------------------------------------------------------------------------------- Pain Assessment Details Patient Name: Angel Reynolds. Date of Service: 01/27/2021 9:30 AM Medical Record Number: [id number] Patient Account Number: [id number] Date of Birth/Sex: 1956/08/21 (65 y.o. F) Treating RN: Dolan Amen Primary Care Hosanna Betley: Cher Nakai Other Clinician: Jeanine Luz Referring Jylan Loeza: Molli Barrows Treating Mauriana Dann/Extender: Skipper Cliche in Treatment: 0 Active Problems Location of Pain Severity and Description of Pain Patient Has Paino No Site Locations Rate the pain. Current Pain Level: 0 Pain Management and Medication Current Pain Management: Electronic Signature(s) Signed: 01/27/2021 3:09:36 PM By: Georges Mouse, Minus Breeding RN Entered By: Georges Mouse, Kenia on 01/27/2021 09:35:30 Angel Reynolds  ([id number]) -------------------------------------------------------------------------------- Patient/Caregiver Education Details Patient Name: Angel Reynolds. Date of Service: 01/27/2021 9:30 AM Medical Record Number: [id number] Patient Account Number: [id number] Date of Birth/Gender: 12/14/1955 (65 y.o. F) Treating RN: Carlene Coria Primary Care Physician: Cher Nakai Other Clinician: Jeanine Luz Referring Physician: Molli Barrows Treating Physician/Extender: Skipper Cliche in Treatment: 0 Education Assessment Education Provided To: Patient Education Topics Provided Wound/Skin Impairment: Methods: Explain/Verbal Responses: State content correctly Electronic Signature(s) Signed: 01/30/2021 5:08:01 PM By: Carlene Coria RN Entered By: Carlene Coria on 01/27/2021 10:40:20 Angel Reynolds ([id number]) -------------------------------------------------------------------------------- Wound Assessment Details Patient Name: Angel Reynolds. Date of Service: 01/27/2021 9:30 AM Medical Record Number: [id number] Patient Account Number: [id number] Date of Birth/Sex: May 04, 1956 (65 y.o. F) Treating RN: Cornell Barman Primary Care Ambrosio Reuter: Cher Nakai Other Clinician: Jeanine Luz Referring Carrell Rahmani: Molli Barrows Treating Pelagia Iacobucci/Extender: Skipper Cliche in Treatment: 0 Wound Status Wound Number: 1 Primary Etiology: Abscess Wound Location: Right Breast Wound Status: Open Wounding Event: Gradually Appeared Comorbid History: Hypertension, Type II Diabetes, Osteoarthritis Date Acquired: 12/30/2020 Weeks Of Treatment: 0 Clustered Wound: No Photos Wound Measurements Length: (cm) 1.2 Width: (cm) 3.7 Depth: (cm) 0.6 Area: (cm) 3.487 Volume: (cm) 2.092 % Reduction in Area: 0% % Reduction in Volume: 0% Epithelialization: None Tunneling: No Undermining: Yes Starting Position (o'clock): 12 Ending Position (o'clock): 12 Maximum Distance: (cm) 1.2 Wound  Description Classification:  as needed Assess impairment of mobility on admission and as needed per policy Assess personal safety and home safety (as indicated) on admission and as needed Assess self care needs on admission and as needed Notes: Nutrition Nursing Diagnoses: Impaired glucose control: actual or potential Goals: Patient/caregiver verbalizes understanding of need to maintain therapeutic glucose control per primary care physician Date Initiated: 01/27/2021 Target Resolution Date: 03/02/2021 Goal Status: Active Interventions: Assess HgA1c results as ordered upon admission and as needed Assess patient nutrition upon admission and as needed per policy Notes: Wound/Skin Impairment Nursing Diagnoses: Knowledge deficit related to ulceration/compromised skin integrity Goals: Patient/caregiver will verbalize understanding of skin care regimen Date Initiated: 01/27/2021 Target Resolution Date:  02/27/2021 Goal Status: Active Ulcer/skin breakdown will have a volume reduction of 30% by week 4 Date Initiated: 01/27/2021 Target Resolution Date: 03/29/2021 Goal Status: Active Ulcer/skin breakdown will have a volume reduction of 50% by week 8 Date Initiated: 01/27/2021 Target Resolution Date: 04/29/2021 Goal Status: Active Ulcer/skin breakdown will have a volume reduction of 80% by week 12 Date Initiated: 01/27/2021 Target Resolution Date: 05/29/2021 CALIANN, LECKRONE ([id number]) Goal Status: Active Ulcer/skin breakdown will heal within 14 weeks Date Initiated: 01/27/2021 Target Resolution Date: 06/29/2021 Goal Status: Active Interventions: Assess patient/caregiver ability to obtain necessary supplies Assess patient/caregiver ability to perform ulcer/skin care regimen upon admission and as needed Assess ulceration(s) every visit Notes: Electronic Signature(s) Signed: 01/30/2021 5:08:01 PM By: Carlene Coria RN Entered By: Carlene Coria on 01/27/2021 10:29:04 Angel Reynolds ([id number]) -------------------------------------------------------------------------------- Pain Assessment Details Patient Name: Angel Reynolds. Date of Service: 01/27/2021 9:30 AM Medical Record Number: [id number] Patient Account Number: [id number] Date of Birth/Sex: 1956/08/21 (65 y.o. F) Treating RN: Dolan Amen Primary Care Hosanna Betley: Cher Nakai Other Clinician: Jeanine Luz Referring Jylan Loeza: Molli Barrows Treating Mauriana Dann/Extender: Skipper Cliche in Treatment: 0 Active Problems Location of Pain Severity and Description of Pain Patient Has Paino No Site Locations Rate the pain. Current Pain Level: 0 Pain Management and Medication Current Pain Management: Electronic Signature(s) Signed: 01/27/2021 3:09:36 PM By: Georges Mouse, Minus Breeding RN Entered By: Georges Mouse, Kenia on 01/27/2021 09:35:30 Angel Reynolds  ([id number]) -------------------------------------------------------------------------------- Patient/Caregiver Education Details Patient Name: Angel Reynolds. Date of Service: 01/27/2021 9:30 AM Medical Record Number: [id number] Patient Account Number: [id number] Date of Birth/Gender: 12/14/1955 (65 y.o. F) Treating RN: Carlene Coria Primary Care Physician: Cher Nakai Other Clinician: Jeanine Luz Referring Physician: Molli Barrows Treating Physician/Extender: Skipper Cliche in Treatment: 0 Education Assessment Education Provided To: Patient Education Topics Provided Wound/Skin Impairment: Methods: Explain/Verbal Responses: State content correctly Electronic Signature(s) Signed: 01/30/2021 5:08:01 PM By: Carlene Coria RN Entered By: Carlene Coria on 01/27/2021 10:40:20 Angel Reynolds ([id number]) -------------------------------------------------------------------------------- Wound Assessment Details Patient Name: Angel Reynolds. Date of Service: 01/27/2021 9:30 AM Medical Record Number: [id number] Patient Account Number: [id number] Date of Birth/Sex: May 04, 1956 (65 y.o. F) Treating RN: Cornell Barman Primary Care Ambrosio Reuter: Cher Nakai Other Clinician: Jeanine Luz Referring Carrell Rahmani: Molli Barrows Treating Pelagia Iacobucci/Extender: Skipper Cliche in Treatment: 0 Wound Status Wound Number: 1 Primary Etiology: Abscess Wound Location: Right Breast Wound Status: Open Wounding Event: Gradually Appeared Comorbid History: Hypertension, Type II Diabetes, Osteoarthritis Date Acquired: 12/30/2020 Weeks Of Treatment: 0 Clustered Wound: No Photos Wound Measurements Length: (cm) 1.2 Width: (cm) 3.7 Depth: (cm) 0.6 Area: (cm) 3.487 Volume: (cm) 2.092 % Reduction in Area: 0% % Reduction in Volume: 0% Epithelialization: None Tunneling: No Undermining: Yes Starting Position (o'clock): 12 Ending Position (o'clock): 12 Maximum Distance: (cm) 1.2 Wound  Description Classification:  NICOLETTA, HUSH ([id number]) Visit Report for 01/27/2021 Allergy List Details Patient Name: SOUL, DEVENEY. Date of Service: 01/27/2021 9:30 AM Medical Record Number: [id number] Patient Account Number: [id number] Date of Birth/Sex: 1956-07-03 (65 y.o. F) Treating RN: Dolan Amen Primary Care Graciano Batson: Cher Nakai Other Clinician: Jeanine Luz Referring Keonta Monceaux: Molli Barrows Treating Amaira Safley/Extender: Jeri Cos Weeks in Treatment: 0 Allergies Active Allergies Mobic Allergy Notes Electronic Signature(s) Signed: 01/27/2021 3:09:36 PM By: Georges Mouse, Minus Breeding RN Entered By: Georges Mouse, Minus Breeding on 01/27/2021 09:39:55 Angel Reynolds ([id number]) -------------------------------------------------------------------------------- Arrival Information Details Patient Name: Angel Reynolds. Date of Service: 01/27/2021 9:30 AM Medical Record Number: [id number] Patient Account Number: [id number] Date of Birth/Sex: 05-Sep-1956 (65 y.o. F) Treating RN: Dolan Amen Primary Care Canuto Kingston: Cher Nakai Other Clinician: Jeanine Luz Referring Alferd Obryant: Molli Barrows Treating Sindi Beckworth/Extender: Skipper Cliche in Treatment: 0 Visit Information Patient Arrived: Ambulatory Arrival Time: 09:35 Accompanied By: husband Transfer Assistance: None Patient Identification Verified: Yes Secondary Verification Process Completed: Yes Patient Has Alerts: Yes Patient Alerts: Type II Diabetic Electronic Signature(s) Signed: 01/27/2021 9:55:25 AM By: Georges Mouse, Minus Breeding RN Entered By: Georges Mouse, Minus Breeding on 01/27/2021 09:55:24 Angel Reynolds ([id number]) -------------------------------------------------------------------------------- Clinic Level of Care Assessment Details Patient Name: Angel Reynolds. Date of Service: 01/27/2021 9:30 AM Medical Record Number: [id number] Patient Account Number: [id number] Date of Birth/Sex: 07-15-1956 (65 y.o. F) Treating RN: Carlene Coria Primary Care Yolanda Dockendorf: Cher Nakai Other Clinician: Jeanine Luz Referring Calogero Geisen: Molli Barrows Treating Bridgitt Raggio/Extender: Skipper Cliche in Treatment: 0 Clinic Level of Care Assessment Items TOOL 1 Quantity Score X - Use when EandM and Procedure is performed on INITIAL visit 1 0 ASSESSMENTS - Nursing Assessment / Reassessment X - General Physical Exam (combine w/ comprehensive assessment (listed just below) when performed on new 1 20 pt. evals) X- 1 25 Comprehensive Assessment (HX, ROS, Risk Assessments, Wounds Hx, etc.) ASSESSMENTS - Wound and Skin Assessment / Reassessment '[]'  - Dermatologic / Skin Assessment (not related to wound area) 0 ASSESSMENTS - Ostomy and/or Continence Assessment and Care '[]'  - Incontinence Assessment and Management 0 '[]'  - 0 Ostomy Care Assessment and Management (repouching, etc.) PROCESS - Coordination of Care X - Simple Patient / Family Education for ongoing care 1 15 '[]'  - 0 Complex (extensive) Patient / Family Education for ongoing care '[]'  - 0 Staff obtains Programmer, systems, Records, Test Results / Process Orders '[]'  - 0 Staff telephones HHA, Nursing Homes / Clarify orders / etc '[]'  - 0 Routine Transfer to another Facility (non-emergent condition) '[]'  - 0 Routine Hospital Admission (non-emergent condition) X- 1 15 New Admissions / Biomedical engineer / Ordering NPWT, Apligraf, etc. '[]'  - 0 Emergency Hospital Admission (emergent condition) PROCESS - Special Needs '[]'  - Pediatric / Minor Patient Management 0 '[]'  - 0 Isolation Patient Management '[]'  - 0 Hearing / Language / Visual special needs '[]'  - 0 Assessment of Community assistance (transportation, D/C planning, etc.) '[]'  - 0 Additional assistance / Altered mentation '[]'  - 0 Support Surface(s) Assessment (bed, cushion, seat, etc.) INTERVENTIONS - Miscellaneous '[]'  - External ear exam 0 '[]'  - 0 Patient Transfer (multiple staff / Civil Service fast streamer / Similar devices) '[]'  - 0 Simple Staple  / Suture removal (25 or less) '[]'  - 0 Complex Staple / Suture removal (26 or more) '[]'  - 0 Hypo/Hyperglycemic Management (do not check if billed separately) X- 1 15 Ankle / Brachial Index (ABI) - do not check if billed separately Has the patient been seen at the hospital within the last three  NICOLETTA, HUSH ([id number]) Visit Report for 01/27/2021 Allergy List Details Patient Name: SOUL, DEVENEY. Date of Service: 01/27/2021 9:30 AM Medical Record Number: [id number] Patient Account Number: [id number] Date of Birth/Sex: 1956-07-03 (65 y.o. F) Treating RN: Dolan Amen Primary Care Graciano Batson: Cher Nakai Other Clinician: Jeanine Luz Referring Keonta Monceaux: Molli Barrows Treating Amaira Safley/Extender: Jeri Cos Weeks in Treatment: 0 Allergies Active Allergies Mobic Allergy Notes Electronic Signature(s) Signed: 01/27/2021 3:09:36 PM By: Georges Mouse, Minus Breeding RN Entered By: Georges Mouse, Minus Breeding on 01/27/2021 09:39:55 Angel Reynolds ([id number]) -------------------------------------------------------------------------------- Arrival Information Details Patient Name: Angel Reynolds. Date of Service: 01/27/2021 9:30 AM Medical Record Number: [id number] Patient Account Number: [id number] Date of Birth/Sex: 05-Sep-1956 (65 y.o. F) Treating RN: Dolan Amen Primary Care Canuto Kingston: Cher Nakai Other Clinician: Jeanine Luz Referring Alferd Obryant: Molli Barrows Treating Sindi Beckworth/Extender: Skipper Cliche in Treatment: 0 Visit Information Patient Arrived: Ambulatory Arrival Time: 09:35 Accompanied By: husband Transfer Assistance: None Patient Identification Verified: Yes Secondary Verification Process Completed: Yes Patient Has Alerts: Yes Patient Alerts: Type II Diabetic Electronic Signature(s) Signed: 01/27/2021 9:55:25 AM By: Georges Mouse, Minus Breeding RN Entered By: Georges Mouse, Minus Breeding on 01/27/2021 09:55:24 Angel Reynolds ([id number]) -------------------------------------------------------------------------------- Clinic Level of Care Assessment Details Patient Name: Angel Reynolds. Date of Service: 01/27/2021 9:30 AM Medical Record Number: [id number] Patient Account Number: [id number] Date of Birth/Sex: 07-15-1956 (65 y.o. F) Treating RN: Carlene Coria Primary Care Yolanda Dockendorf: Cher Nakai Other Clinician: Jeanine Luz Referring Calogero Geisen: Molli Barrows Treating Bridgitt Raggio/Extender: Skipper Cliche in Treatment: 0 Clinic Level of Care Assessment Items TOOL 1 Quantity Score X - Use when EandM and Procedure is performed on INITIAL visit 1 0 ASSESSMENTS - Nursing Assessment / Reassessment X - General Physical Exam (combine w/ comprehensive assessment (listed just below) when performed on new 1 20 pt. evals) X- 1 25 Comprehensive Assessment (HX, ROS, Risk Assessments, Wounds Hx, etc.) ASSESSMENTS - Wound and Skin Assessment / Reassessment '[]'  - Dermatologic / Skin Assessment (not related to wound area) 0 ASSESSMENTS - Ostomy and/or Continence Assessment and Care '[]'  - Incontinence Assessment and Management 0 '[]'  - 0 Ostomy Care Assessment and Management (repouching, etc.) PROCESS - Coordination of Care X - Simple Patient / Family Education for ongoing care 1 15 '[]'  - 0 Complex (extensive) Patient / Family Education for ongoing care '[]'  - 0 Staff obtains Programmer, systems, Records, Test Results / Process Orders '[]'  - 0 Staff telephones HHA, Nursing Homes / Clarify orders / etc '[]'  - 0 Routine Transfer to another Facility (non-emergent condition) '[]'  - 0 Routine Hospital Admission (non-emergent condition) X- 1 15 New Admissions / Biomedical engineer / Ordering NPWT, Apligraf, etc. '[]'  - 0 Emergency Hospital Admission (emergent condition) PROCESS - Special Needs '[]'  - Pediatric / Minor Patient Management 0 '[]'  - 0 Isolation Patient Management '[]'  - 0 Hearing / Language / Visual special needs '[]'  - 0 Assessment of Community assistance (transportation, D/C planning, etc.) '[]'  - 0 Additional assistance / Altered mentation '[]'  - 0 Support Surface(s) Assessment (bed, cushion, seat, etc.) INTERVENTIONS - Miscellaneous '[]'  - External ear exam 0 '[]'  - 0 Patient Transfer (multiple staff / Civil Service fast streamer / Similar devices) '[]'  - 0 Simple Staple  / Suture removal (25 or less) '[]'  - 0 Complex Staple / Suture removal (26 or more) '[]'  - 0 Hypo/Hyperglycemic Management (do not check if billed separately) X- 1 15 Ankle / Brachial Index (ABI) - do not check if billed separately Has the patient been seen at the hospital within the last three

## 2021-02-03 ENCOUNTER — Other Ambulatory Visit: Payer: Self-pay

## 2021-02-03 ENCOUNTER — Encounter: Payer: Medicare HMO | Attending: Physician Assistant | Admitting: Physician Assistant

## 2021-02-03 DIAGNOSIS — I10 Essential (primary) hypertension: Secondary | ICD-10-CM | POA: Diagnosis not present

## 2021-02-03 DIAGNOSIS — L98492 Non-pressure chronic ulcer of skin of other sites with fat layer exposed: Secondary | ICD-10-CM | POA: Diagnosis not present

## 2021-02-03 DIAGNOSIS — E1151 Type 2 diabetes mellitus with diabetic peripheral angiopathy without gangrene: Secondary | ICD-10-CM | POA: Insufficient documentation

## 2021-02-03 DIAGNOSIS — E11622 Type 2 diabetes mellitus with other skin ulcer: Secondary | ICD-10-CM | POA: Insufficient documentation

## 2021-02-03 NOTE — Progress Notes (Addendum)
Angel Reynolds, Angel Reynolds ([id number]) Visit Report for 02/03/2021 Arrival Information Details Patient Name: Angel Reynolds, Angel Reynolds. Date of Service: 02/03/2021 1:30 PM Medical Record Number: [id number] Patient Account Number: [id number] Date of Birth/Sex: December 31, 1955 (65 y.o. F) Treating RN: Carlene Coria Primary Care Josephine Wooldridge: Cher Nakai Other Clinician: Jeanine Luz Referring Jackey Housey: Cher Nakai Treating Rebel Willcutt/Extender: Skipper Cliche in Treatment: 1 Visit Information History Since Last Visit Added or deleted any medications: No Patient Arrived: Ambulatory Had a fall or experienced change in No Arrival Time: 13:47 activities of daily living that may affect Accompanied By: husband risk of falls: Transfer Assistance: None Hospitalized since last visit: No Patient Identification Verified: Yes Pain Present Now: No Secondary Verification Process Completed: Yes Patient Has Alerts: Yes Patient Alerts: Type II Diabetic Electronic Signature(s) Signed: 02/06/2021 4:17:01 PM By: Jeanine Luz Entered By: Jeanine Luz on 02/03/2021 13:47:45 Angel Reynolds ([id number]) -------------------------------------------------------------------------------- Clinic Level of Care Assessment Details Patient Name: Angel Reynolds. Date of Service: 02/03/2021 1:30 PM Medical Record Number: [id number] Patient Account Number: [id number] Date of Birth/Sex: 1956-06-01 (65 y.o. F) Treating RN: Carlene Coria Primary Care Lycan Davee: Cher Nakai Other Clinician: Jeanine Luz Referring Sayuri Rhames: Cher Nakai Treating Kellina Dreese/Extender: Skipper Cliche in Treatment: 1 Clinic Level of Care Assessment Items TOOL 4 Quantity Score X - Use when only an EandM is performed on FOLLOW-UP visit 1 0 ASSESSMENTS - Nursing Assessment / Reassessment X - Reassessment of Co-morbidities (includes updates in patient status) 1 10 X- 1 5 Reassessment of Adherence to Treatment Plan ASSESSMENTS - Wound and Skin Assessment /  Reassessment X - Simple Wound Assessment / Reassessment - one wound 1 5 '[]'  - 0 Complex Wound Assessment / Reassessment - multiple wounds '[]'  - 0 Dermatologic / Skin Assessment (not related to wound area) ASSESSMENTS - Focused Assessment '[]'  - Circumferential Edema Measurements - multi extremities 0 '[]'  - 0 Nutritional Assessment / Counseling / Intervention '[]'  - 0 Lower Extremity Assessment (monofilament, tuning fork, pulses) '[]'  - 0 Peripheral Arterial Disease Assessment (using hand held doppler) ASSESSMENTS - Ostomy and/or Continence Assessment and Care '[]'  - Incontinence Assessment and Management 0 '[]'  - 0 Ostomy Care Assessment and Management (repouching, etc.) PROCESS - Coordination of Care X - Simple Patient / Family Education for ongoing care 1 15 '[]'  - 0 Complex (extensive) Patient / Family Education for ongoing care X- 1 10 Staff obtains Consents, Records, Test Results / Process Orders '[]'  - 0 Staff telephones HHA, Nursing Homes / Clarify orders / etc '[]'  - 0 Routine Transfer to another Facility (non-emergent condition) '[]'  - 0 Routine Hospital Admission (non-emergent condition) '[]'  - 0 New Admissions / Biomedical engineer / Ordering NPWT, Apligraf, etc. '[]'  - 0 Emergency Hospital Admission (emergent condition) X- 1 10 Simple Discharge Coordination '[]'  - 0 Complex (extensive) Discharge Coordination PROCESS - Special Needs '[]'  - Pediatric / Minor Patient Management 0 '[]'  - 0 Isolation Patient Management '[]'  - 0 Hearing / Language / Visual special needs '[]'  - 0 Assessment of Community assistance (transportation, D/C planning, etc.) '[]'  - 0 Additional assistance / Altered mentation '[]'  - 0 Support Surface(s) Assessment (bed, cushion, seat, etc.) INTERVENTIONS - Wound Cleansing / Measurement Angel Reynolds, Angel Reynolds. ([id number]) X- 1 5 Simple Wound Cleansing - one wound '[]'  - 0 Complex Wound Cleansing - multiple wounds X- 1 5 Wound Imaging (photographs - any number of  wounds) '[]'  - 0 Wound Tracing (instead of photographs) X- 1 5 Simple Wound Measurement - one wound '[]'  - 0 Complex Wound Measurement - multiple  Amount: Medium (34-66%) N/A N/A Exposed Structures: Fat Layer (Subcutaneous Tissue): N/A N/A Yes Fascia: No Tendon: No Muscle: No Joint: No Bone: No Epithelialization: None N/A N/A Treatment Notes Electronic Signature(s) Signed: 02/10/2021 8:11:40 AM By: Carlene Coria RN Entered By: Carlene Coria on 02/03/2021 14:01:51 Angel Reynolds ([id number]) Angel Reynolds ([id number]) -------------------------------------------------------------------------------- [place] Details Patient Name: Angel Reynolds. Date of Service: 02/03/2021 1:30 PM Medical Record Number: [id number] Patient Account Number: [id number] Date of Birth/Sex: 1956/07/25 (65 y.o. F) Treating RN: Carlene Coria Primary Care Myleah Cavendish: Cher Nakai Other Clinician: Jeanine Luz Referring Montie Swiderski: Cher Nakai Treating Dosha Broshears/Extender: Skipper Cliche in Treatment: 1 Active Inactive Abuse / Safety / Falls / Self Care Management Nursing Diagnoses: Potential for injury related to falls Goals: Patient will remain injury free related to falls Date Initiated: 01/27/2021 Target Resolution Date: 02/27/2021 Goal Status: Active Interventions: Assess Activities of Daily Living upon admission and as needed Assess fall risk on admission and as needed Assess: immobility, friction, shearing, incontinence upon admission and as needed Assess impairment of mobility on admission and as  needed per policy Assess personal safety and home safety (as indicated) on admission and as needed Assess self care needs on admission and as needed Notes: Nutrition Nursing Diagnoses: Impaired glucose control: actual or potential Goals: Patient/caregiver verbalizes understanding of need to maintain therapeutic glucose control per primary care physician Date Initiated: 01/27/2021 Target Resolution Date: 03/02/2021 Goal Status: Active Interventions: Assess HgA1c results as ordered upon admission and as needed Assess patient nutrition upon admission and as needed per policy Notes: Wound/Skin Impairment Nursing Diagnoses: Knowledge deficit related to ulceration/compromised skin integrity Goals: Patient/caregiver will verbalize understanding of skin care regimen Date Initiated: 01/27/2021 Target Resolution Date: 02/27/2021 Goal Status: Active Ulcer/skin breakdown will have a volume reduction of 30% by week 4 Date Initiated: 01/27/2021 Target Resolution Date: 03/29/2021 Goal Status: Active Ulcer/skin breakdown will have a volume reduction of 50% by week 8 Date Initiated: 01/27/2021 Target Resolution Date: 04/29/2021 Goal Status: Active Ulcer/skin breakdown will have a volume reduction of 80% by week 12 Date Initiated: 01/27/2021 Target Resolution Date: 05/29/2021 Angel Reynolds, Angel Reynolds ([id number]) Goal Status: Active Ulcer/skin breakdown will heal within 14 weeks Date Initiated: 01/27/2021 Target Resolution Date: 06/29/2021 Goal Status: Active Interventions: Assess patient/caregiver ability to obtain necessary supplies Assess patient/caregiver ability to perform ulcer/skin care regimen upon admission and as needed Assess ulceration(s) every visit Notes: Electronic Signature(s) Signed: 02/10/2021 8:11:40 AM By: Carlene Coria RN Entered By: Carlene Coria on 02/03/2021 14:01:42 Angel Reynolds ([id number]) -------------------------------------------------------------------------------- Pain  Assessment Details Patient Name: Angel Reynolds. Date of Service: 02/03/2021 1:30 PM Medical Record Number: [id number] Patient Account Number: [id number] Date of Birth/Sex: 25-Dec-1955 (64 y.o. F) Treating RN: Carlene Coria Primary Care Anisa Leanos: Cher Nakai Other Clinician: Jeanine Luz Referring Amayah Staheli: Cher Nakai Treating Lonni Dirden/Extender: Skipper Cliche in Treatment: 1 Active Problems Location of Pain Severity and Description of Pain Patient Has Paino No Site Locations Rate the pain. Current Pain Level: 0 Pain Management and Medication Current Pain Management: Electronic Signature(s) Signed: 02/06/2021 4:17:01 PM By: Jeanine Luz Signed: 02/10/2021 8:11:40 AM By: Carlene Coria RN Entered By: Jeanine Luz on 02/03/2021 13:48:33 Angel Reynolds ([id number]) -------------------------------------------------------------------------------- Patient/Caregiver Education Details Patient Name: Angel Reynolds. Date of Service: 02/03/2021 1:30 PM Medical Record Number: [id number] Patient Account Number: [id number] Date of Birth/Gender: 05/30/1956 (64 y.o. F) Treating RN: Carlene Coria Primary Care Physician: Cher Nakai Other Clinician: Jeanine Luz Referring Physician: Cher Nakai Treating Physician/Extender: Skipper Cliche in  Amount: Medium (34-66%) N/A N/A Exposed Structures: Fat Layer (Subcutaneous Tissue): N/A N/A Yes Fascia: No Tendon: No Muscle: No Joint: No Bone: No Epithelialization: None N/A N/A Treatment Notes Electronic Signature(s) Signed: 02/10/2021 8:11:40 AM By: Carlene Coria RN Entered By: Carlene Coria on 02/03/2021 14:01:51 Angel Reynolds ([id number]) Angel Reynolds ([id number]) -------------------------------------------------------------------------------- [place] Details Patient Name: Angel Reynolds. Date of Service: 02/03/2021 1:30 PM Medical Record Number: [id number] Patient Account Number: [id number] Date of Birth/Sex: 1956/07/25 (65 y.o. F) Treating RN: Carlene Coria Primary Care Myleah Cavendish: Cher Nakai Other Clinician: Jeanine Luz Referring Montie Swiderski: Cher Nakai Treating Dosha Broshears/Extender: Skipper Cliche in Treatment: 1 Active Inactive Abuse / Safety / Falls / Self Care Management Nursing Diagnoses: Potential for injury related to falls Goals: Patient will remain injury free related to falls Date Initiated: 01/27/2021 Target Resolution Date: 02/27/2021 Goal Status: Active Interventions: Assess Activities of Daily Living upon admission and as needed Assess fall risk on admission and as needed Assess: immobility, friction, shearing, incontinence upon admission and as needed Assess impairment of mobility on admission and as  needed per policy Assess personal safety and home safety (as indicated) on admission and as needed Assess self care needs on admission and as needed Notes: Nutrition Nursing Diagnoses: Impaired glucose control: actual or potential Goals: Patient/caregiver verbalizes understanding of need to maintain therapeutic glucose control per primary care physician Date Initiated: 01/27/2021 Target Resolution Date: 03/02/2021 Goal Status: Active Interventions: Assess HgA1c results as ordered upon admission and as needed Assess patient nutrition upon admission and as needed per policy Notes: Wound/Skin Impairment Nursing Diagnoses: Knowledge deficit related to ulceration/compromised skin integrity Goals: Patient/caregiver will verbalize understanding of skin care regimen Date Initiated: 01/27/2021 Target Resolution Date: 02/27/2021 Goal Status: Active Ulcer/skin breakdown will have a volume reduction of 30% by week 4 Date Initiated: 01/27/2021 Target Resolution Date: 03/29/2021 Goal Status: Active Ulcer/skin breakdown will have a volume reduction of 50% by week 8 Date Initiated: 01/27/2021 Target Resolution Date: 04/29/2021 Goal Status: Active Ulcer/skin breakdown will have a volume reduction of 80% by week 12 Date Initiated: 01/27/2021 Target Resolution Date: 05/29/2021 Angel Reynolds, Angel Reynolds ([id number]) Goal Status: Active Ulcer/skin breakdown will heal within 14 weeks Date Initiated: 01/27/2021 Target Resolution Date: 06/29/2021 Goal Status: Active Interventions: Assess patient/caregiver ability to obtain necessary supplies Assess patient/caregiver ability to perform ulcer/skin care regimen upon admission and as needed Assess ulceration(s) every visit Notes: Electronic Signature(s) Signed: 02/10/2021 8:11:40 AM By: Carlene Coria RN Entered By: Carlene Coria on 02/03/2021 14:01:42 Angel Reynolds ([id number]) -------------------------------------------------------------------------------- Pain  Assessment Details Patient Name: Angel Reynolds. Date of Service: 02/03/2021 1:30 PM Medical Record Number: [id number] Patient Account Number: [id number] Date of Birth/Sex: 25-Dec-1955 (64 y.o. F) Treating RN: Carlene Coria Primary Care Anisa Leanos: Cher Nakai Other Clinician: Jeanine Luz Referring Amayah Staheli: Cher Nakai Treating Lonni Dirden/Extender: Skipper Cliche in Treatment: 1 Active Problems Location of Pain Severity and Description of Pain Patient Has Paino No Site Locations Rate the pain. Current Pain Level: 0 Pain Management and Medication Current Pain Management: Electronic Signature(s) Signed: 02/06/2021 4:17:01 PM By: Jeanine Luz Signed: 02/10/2021 8:11:40 AM By: Carlene Coria RN Entered By: Jeanine Luz on 02/03/2021 13:48:33 Angel Reynolds ([id number]) -------------------------------------------------------------------------------- Patient/Caregiver Education Details Patient Name: Angel Reynolds. Date of Service: 02/03/2021 1:30 PM Medical Record Number: [id number] Patient Account Number: [id number] Date of Birth/Gender: 05/30/1956 (64 y.o. F) Treating RN: Carlene Coria Primary Care Physician: Cher Nakai Other Clinician: Jeanine Luz Referring Physician: Cher Nakai Treating Physician/Extender: Skipper Cliche in  Angel Reynolds, Angel Reynolds ([id number]) Visit Report for 02/03/2021 Arrival Information Details Patient Name: Angel Reynolds, Angel Reynolds. Date of Service: 02/03/2021 1:30 PM Medical Record Number: [id number] Patient Account Number: [id number] Date of Birth/Sex: December 31, 1955 (65 y.o. F) Treating RN: Carlene Coria Primary Care Josephine Wooldridge: Cher Nakai Other Clinician: Jeanine Luz Referring Jackey Housey: Cher Nakai Treating Rebel Willcutt/Extender: Skipper Cliche in Treatment: 1 Visit Information History Since Last Visit Added or deleted any medications: No Patient Arrived: Ambulatory Had a fall or experienced change in No Arrival Time: 13:47 activities of daily living that may affect Accompanied By: husband risk of falls: Transfer Assistance: None Hospitalized since last visit: No Patient Identification Verified: Yes Pain Present Now: No Secondary Verification Process Completed: Yes Patient Has Alerts: Yes Patient Alerts: Type II Diabetic Electronic Signature(s) Signed: 02/06/2021 4:17:01 PM By: Jeanine Luz Entered By: Jeanine Luz on 02/03/2021 13:47:45 Angel Reynolds ([id number]) -------------------------------------------------------------------------------- Clinic Level of Care Assessment Details Patient Name: Angel Reynolds. Date of Service: 02/03/2021 1:30 PM Medical Record Number: [id number] Patient Account Number: [id number] Date of Birth/Sex: 1956-06-01 (65 y.o. F) Treating RN: Carlene Coria Primary Care Lycan Davee: Cher Nakai Other Clinician: Jeanine Luz Referring Sayuri Rhames: Cher Nakai Treating Kellina Dreese/Extender: Skipper Cliche in Treatment: 1 Clinic Level of Care Assessment Items TOOL 4 Quantity Score X - Use when only an EandM is performed on FOLLOW-UP visit 1 0 ASSESSMENTS - Nursing Assessment / Reassessment X - Reassessment of Co-morbidities (includes updates in patient status) 1 10 X- 1 5 Reassessment of Adherence to Treatment Plan ASSESSMENTS - Wound and Skin Assessment /  Reassessment X - Simple Wound Assessment / Reassessment - one wound 1 5 '[]'  - 0 Complex Wound Assessment / Reassessment - multiple wounds '[]'  - 0 Dermatologic / Skin Assessment (not related to wound area) ASSESSMENTS - Focused Assessment '[]'  - Circumferential Edema Measurements - multi extremities 0 '[]'  - 0 Nutritional Assessment / Counseling / Intervention '[]'  - 0 Lower Extremity Assessment (monofilament, tuning fork, pulses) '[]'  - 0 Peripheral Arterial Disease Assessment (using hand held doppler) ASSESSMENTS - Ostomy and/or Continence Assessment and Care '[]'  - Incontinence Assessment and Management 0 '[]'  - 0 Ostomy Care Assessment and Management (repouching, etc.) PROCESS - Coordination of Care X - Simple Patient / Family Education for ongoing care 1 15 '[]'  - 0 Complex (extensive) Patient / Family Education for ongoing care X- 1 10 Staff obtains Consents, Records, Test Results / Process Orders '[]'  - 0 Staff telephones HHA, Nursing Homes / Clarify orders / etc '[]'  - 0 Routine Transfer to another Facility (non-emergent condition) '[]'  - 0 Routine Hospital Admission (non-emergent condition) '[]'  - 0 New Admissions / Biomedical engineer / Ordering NPWT, Apligraf, etc. '[]'  - 0 Emergency Hospital Admission (emergent condition) X- 1 10 Simple Discharge Coordination '[]'  - 0 Complex (extensive) Discharge Coordination PROCESS - Special Needs '[]'  - Pediatric / Minor Patient Management 0 '[]'  - 0 Isolation Patient Management '[]'  - 0 Hearing / Language / Visual special needs '[]'  - 0 Assessment of Community assistance (transportation, D/C planning, etc.) '[]'  - 0 Additional assistance / Altered mentation '[]'  - 0 Support Surface(s) Assessment (bed, cushion, seat, etc.) INTERVENTIONS - Wound Cleansing / Measurement Angel Reynolds, Angel Reynolds. ([id number]) X- 1 5 Simple Wound Cleansing - one wound '[]'  - 0 Complex Wound Cleansing - multiple wounds X- 1 5 Wound Imaging (photographs - any number of  wounds) '[]'  - 0 Wound Tracing (instead of photographs) X- 1 5 Simple Wound Measurement - one wound '[]'  - 0 Complex Wound Measurement - multiple

## 2021-02-03 NOTE — Progress Notes (Signed)
CLEOPATRA, SARDO ([id number]) Visit Report for 02/03/2021 Chief Complaint Document Details Patient Name: Angel Reynolds, Angel Reynolds. Date of Service: 02/03/2021 1:30 PM Medical Record Number: [id number] Patient Account Number: [id number] Date of Birth/Sex: 1956/02/26 (64 y.o. F) Treating RN: Carlene Coria Primary Care Provider: Cher Nakai Other Clinician: Jeanine Luz Referring Provider: Cher Nakai Treating Provider/Extender: Skipper Cliche in Treatment: 1 Information Obtained from: Patient Chief Complaint Right breast cellulitis abscess Electronic Signature(s) Signed: 02/03/2021 1:44:01 PM By: Worthy Keeler PA-C Entered By: Worthy Keeler on 02/03/2021 13:44:01 Angel Reynolds ([id number]) -------------------------------------------------------------------------------- HPI Details Patient Name: Angel Reynolds. Date of Service: 02/03/2021 1:30 PM Medical Record Number: [id number] Patient Account Number: [id number] Date of Birth/Sex: Aug 16, 1956 (64 y.o. F) Treating RN: Carlene Coria Primary Care Provider: Cher Nakai Other Clinician: Jeanine Luz Referring Provider: Cher Nakai Treating Provider/Extender: Skipper Cliche in Treatment: 1 History of Present Illness HPI Description: 01/27/2021 upon evaluation today patient appears to be doing somewhat poorly in regard to an abscess over the area just inferior to her right breast. Subsequently she tells me that this started about a month ago initially it was a raised/bumped area that then she put some drawing salve on it and it began to drain. Subsequently she did see her primary care provider and she was placed initially on a 10-day course of doxycycline which then was extended so that she would have this for at least 14 days maybe a little longer I am not sure when the first course was given to her. Nonetheless she has enough to make it through till Monday which is good news. Nonetheless unfortunately she tells me that this is still giving  her quite a bit of discomfort is also draining quite a bit she just put a little bit of antibiotic ointment on it and covering with a dressing if anything. With that being said I think that this probably represents something needs to be cleaned out probably is significantly deeper than what is obvious just on initial inspection. The patient does have a history of diabetes mellitus type 2, hypertension, and otherwise tells me she is never had anything quite like this happen before. 02/03/21 upon evaluation today patient actually is making excellent progress in regard to her wound. Fortunately there does not appear to be any signs of active infection which is great news and overall very pleased in that regard. I do feel like the patient needs to have some Santyl to help clean up the wound edges. I am really somewhat reluctant to perform a lot of debridement here as I am afraid that this can cause more damage to the area that is trying to heal already. Nonetheless I think we can use Santyl along the edges of the wound to try to help clean this up a little better. Electronic Signature(s) Signed: 02/03/2021 2:11:38 PM By: Worthy Keeler PA-C Entered By: Worthy Keeler on 02/03/2021 14:11:37 Angel Reynolds ([id number]) -------------------------------------------------------------------------------- Physical Exam Details Patient Name: Angel Reynolds Date of Service: 02/03/2021 1:30 PM Medical Record Number: [id number] Patient Account Number: [id number] Date of Birth/Sex: July 02, 1956 (64 y.o. F) Treating RN: Carlene Coria Primary Care Provider: Cher Nakai Other Clinician: Jeanine Luz Referring Provider: Cher Nakai Treating Provider/Extender: Skipper Cliche in Treatment: 1 Constitutional Well-nourished and well-hydrated in no acute distress. Respiratory normal breathing without difficulty. Psychiatric this patient is able to make decisions and demonstrates good insight into disease process.  Alert and Oriented x 3. pleasant and cooperative. Notes Upon inspection patient's  wound bed actually showed signs of good granulation epithelization at this time. The undermining is pretty much nonexistent at this point and honestly I really feel like she is doing quite well. Overall the patient is tolerating the dressing changes without complication. Electronic Signature(s) Signed: 02/03/2021 2:11:54 PM By: Worthy Keeler PA-C Entered By: Worthy Keeler on 02/03/2021 14:11:54 Angel Reynolds ([id number]) -------------------------------------------------------------------------------- Physician Orders Details Patient Name: Angel Reynolds. Date of Service: 02/03/2021 1:30 PM Medical Record Number: [id number] Patient Account Number: [id number] Date of Birth/Sex: [birth year] (64 y.o. F) Treating RN: Carlene Coria Primary Care Provider: Cher Nakai Other Clinician: Jeanine Luz Referring Provider: Cher Nakai Treating Provider/Extender: Skipper Cliche in Treatment: 1 Verbal / Phone Orders: No Diagnosis Coding ICD-10 Coding Code Description L02.818 Cutaneous abscess of other sites L98.492 Non-pressure chronic ulcer of skin of other sites with fat layer exposed I10 Essential (primary) hypertension E11.622 Type 2 diabetes mellitus with other skin ulcer Follow-up Appointments o Return Appointment in 1 week. Wound Treatment Wound #1 - Breast Wound Laterality: Right Cleanser: Byram Ancillary Kit - 15 Day Supply (Generic) 1 x Per Day/30 Days Discharge Instructions: Use supplies as instructed; Kit contains: (15) Saline Bullets; (15) 3x3 Gauze; 15 pr Gloves Cleanser: Normal Saline (Generic) 1 x Per Day/30 Days Discharge Instructions: Wash your hands with soap and water. Remove old dressing, discard into plastic bag and place into trash. Cleanse the wound with Normal Saline prior to applying a clean dressing using gauze sponges, not tissues or cotton balls. Do not scrub or use excessive  force. Pat dry using gauze sponges, not tissue or cotton balls. Primary Dressing: Gauze (Generic) 1 x Per Day/30 Days Discharge Instructions: moistened with Dakins Solution 1/4 strength Primary Dressing: Santyl Collagenase Ointment, 30 (gm), tube (Generic) 1 x Per Day/30 Days Discharge Instructions: apply to wound boarders only, nickle thick Secondary Dressing: Bordered Gauze Sterile-HBD 4x4 (in/in) (Generic) 1 x Per Day/30 Days Discharge Instructions: Cover wound with Bordered Guaze Sterile as directed Patient Medications Allergies: Mobic Notifications Medication Indication Start End Santyl 02/03/2021 DOSE topical 250 unit/gram ointment - ointment topical Apply nickel thick daily to the wound bed and then cover with a dressing as directed in clinic x 30 days Electronic Signature(s) Signed: 02/03/2021 2:13:47 PM By: Worthy Keeler PA-C Entered By: Worthy Keeler on 02/03/2021 14:13:45 Angel Reynolds ([id number]) -------------------------------------------------------------------------------- Problem List Details Patient Name: Angel Reynolds. Date of Service: 02/03/2021 1:30 PM Medical Record Number: [id number] Patient Account Number: [id number] Date of Birth/Sex: 15-Jan-1956 (64 y.o. F) Treating RN: Carlene Coria Primary Care Provider: Cher Nakai Other Clinician: Jeanine Luz Referring Provider: Cher Nakai Treating Provider/Extender: Skipper Cliche in Treatment: 1 Active Problems ICD-10 Encounter Code Description Active Date MDM Diagnosis L02.818 Cutaneous abscess of other sites 01/27/2021 No Yes L98.492 Non-pressure chronic ulcer of skin of other sites with fat layer exposed 01/27/2021 No Yes I10 Essential (primary) hypertension 01/27/2021 No Yes E11.622 Type 2 diabetes mellitus with other skin ulcer 01/27/2021 No Yes Inactive Problems Resolved Problems Electronic Signature(s) Signed: 02/03/2021 1:43:55 PM By: Worthy Keeler PA-C Entered By: Worthy Keeler on 02/03/2021  13:43:55 Angel Reynolds ([id number]) -------------------------------------------------------------------------------- Progress Note Details Patient Name: Angel Reynolds. Date of Service: 02/03/2021 1:30 PM Medical Record Number: [id number] Patient Account Number: [id number] Date of Birth/Sex: 1956/07/06 (64 y.o. F) Treating RN: Carlene Coria Primary Care Provider: Cher Nakai Other Clinician: Jeanine Luz Referring Provider: Cher Nakai Treating Provider/Extender: Skipper Cliche in Treatment: 1 Subjective Chief Complaint Information  obtained from Patient Right breast cellulitis abscess History of Present Illness (HPI) 01/27/2021 upon evaluation today patient appears to be doing somewhat poorly in regard to an abscess over the area just inferior to her right breast. Subsequently she tells me that this started about a month ago initially it was a raised/bumped area that then she put some drawing salve on it and it began to drain. Subsequently she did see her primary care provider and she was placed initially on a 10-day course of doxycycline which then was extended so that she would have this for at least 14 days maybe a little longer I am not sure when the first course was given to her. Nonetheless she has enough to make it through till Monday which is good news. Nonetheless unfortunately she tells me that this is still giving her quite a bit of discomfort is also draining quite a bit she just put a little bit of antibiotic ointment on it and covering with a dressing if anything. With that being said I think that this probably represents something needs to be cleaned out probably is significantly deeper than what is obvious just on initial inspection. The patient does have a history of diabetes mellitus type 2, hypertension, and otherwise tells me she is never had anything quite like this happen before. 02/03/21 upon evaluation today patient actually is making excellent progress in regard  to her wound. Fortunately there does not appear to be any signs of active infection which is great news and overall very pleased in that regard. I do feel like the patient needs to have some Santyl to help clean up the wound edges. I am really somewhat reluctant to perform a lot of debridement here as I am afraid that this can cause more damage to the area that is trying to heal already. Nonetheless I think we can use Santyl along the edges of the wound to try to help clean this up a little better. Objective Constitutional Well-nourished and well-hydrated in no acute distress. Vitals Time Taken: 1:45 PM, Height: 62 in, Weight: 177 lbs, BMI: 32.4, Temperature: 98.0 F, Pulse: 96 bpm, Respiratory Rate: 16 breaths/min, Blood Pressure: 132/78 mmHg. Respiratory normal breathing without difficulty. Psychiatric this patient is able to make decisions and demonstrates good insight into disease process. Alert and Oriented x 3. pleasant and cooperative. General Notes: Upon inspection patient's wound bed actually showed signs of good granulation epithelization at this time. The undermining is pretty much nonexistent at this point and honestly I really feel like she is doing quite well. Overall the patient is tolerating the dressing changes without complication. Integumentary (Hair, Skin) Wound #1 status is Open. Original cause of wound was Gradually Appeared. The date acquired was: 12/30/2020. The wound has been in treatment 1 weeks. The wound is located on the Right Breast. The wound measures 1cm length x 3.6cm width x 0.4cm depth; 2.827cm^2 area and 1.131cm^3 volume. There is Fat Layer (Subcutaneous Tissue) exposed. There is no tunneling noted, however, there is undermining starting at 8:00 and ending at 10:00 with a maximum distance of 0.5cm. There is a medium amount of sanguinous drainage noted. There is medium (34- 66%) red granulation within the wound bed. There is a medium (34-66%) amount of  necrotic tissue within the wound bed including Adherent Slough. Assessment Angel Reynolds, Angel Reynolds ([id number]) Active Problems ICD-10 Cutaneous abscess of other sites Non-pressure chronic ulcer of skin of other sites with fat layer exposed Essential (primary) hypertension Type 2 diabetes mellitus with other  skin ulcer Plan Follow-up Appointments: Return Appointment in 1 week. The following medication(s) was prescribed: Santyl topical 250 unit/gram ointment ointment topical Apply nickel thick daily to the wound bed and then cover with a dressing as directed in clinic x 30 days starting 02/03/2021 WOUND #1: - Breast Wound Laterality: Right Cleanser: Byram Ancillary Kit - 15 Day Supply (Generic) 1 x Per Day/30 Days Discharge Instructions: Use supplies as instructed; Kit contains: (15) Saline Bullets; (15) 3x3 Gauze; 15 pr Gloves Cleanser: Normal Saline (Generic) 1 x Per Day/30 Days Discharge Instructions: Wash your hands with soap and water. Remove old dressing, discard into plastic bag and place into trash. Cleanse the wound with Normal Saline prior to applying a clean dressing using gauze sponges, not tissues or cotton balls. Do not scrub or use excessive force. Pat dry using gauze sponges, not tissue or cotton balls. Primary Dressing: Gauze (Generic) 1 x Per Day/30 Days Discharge Instructions: moistened with Dakins Solution 1/4 strength Primary Dressing: Santyl Collagenase Ointment, 30 (gm), tube (Generic) 1 x Per Day/30 Days Discharge Instructions: apply to wound boarders only, nickle thick Secondary Dressing: Bordered Gauze Sterile-HBD 4x4 (in/in) (Generic) 1 x Per Day/30 Days Discharge Instructions: Cover wound with Bordered Guaze Sterile as directed 1. Would recommend that we going continue with wound care measures as before and the patient is in agreement the plan that includes the use of the Dakin's moistened gauze. I think this is doing a great job for her and I am very pleased in that  regard. Nonetheless am to recommend that we put some of the Santyl around the edges of the wound where she does have a necrotic border. I want to try to help clean this up a little bit more effectively and hopefully that will be beneficial in getting the wound to heal much more completely. 2. I am also can recommend that she continue with the daily dressing changes I think that still appropriate at this time as well. We will see patient back for reevaluation in 1 week here in the clinic. If anything worsens or changes patient will contact our office for additional recommendations. Electronic Signature(s) Signed: 02/03/2021 2:14:04 PM By: Worthy Keeler PA-C Previous Signature: 02/03/2021 2:12:36 PM Version By: Worthy Keeler PA-C Entered By: Worthy Keeler on 02/03/2021 14:14:03 Angel Reynolds ([id number]) -------------------------------------------------------------------------------- SuperBill Details Patient Name: Angel Reynolds. Date of Service: 02/03/2021 Medical Record Number: [id number] Patient Account Number: [id number] Date of Birth/Sex: 06-03-1956 (64 y.o. F) Treating RN: Carlene Coria Primary Care Provider: Cher Nakai Other Clinician: Jeanine Luz Referring Provider: Cher Nakai Treating Provider/Extender: Skipper Cliche in Treatment: 1 Diagnosis Coding ICD-10 Codes Code Description L02.818 Cutaneous abscess of other sites L98.492 Non-pressure chronic ulcer of skin of other sites with fat layer exposed I10 Essential (primary) hypertension E11.622 Type 2 diabetes mellitus with other skin ulcer Facility Procedures CPT4 Code: [id number] Description: 99213 - WOUND CARE VISIT-LEV 3 EST PT Modifier: Quantity: 1 Physician Procedures CPT4 Code: [id number] Description: 19509 - WC PHYS LEVEL 3 - EST PT Modifier: Quantity: 1 CPT4 Code: Description: ICD-10 Diagnosis Description L02.818 Cutaneous abscess of other sites L98.492 Non-pressure chronic ulcer of skin of other sites  with fat layer expos I10 Essential (primary) hypertension E11.622 Type 2 diabetes mellitus with other skin ulcer Modifier: ed Quantity: Electronic Signature(s) Signed: 02/03/2021 2:14:15 PM By: Worthy Keeler PA-C Entered By: Worthy Keeler on 02/03/2021 14:14:15

## 2021-02-10 ENCOUNTER — Other Ambulatory Visit: Payer: Self-pay

## 2021-02-10 ENCOUNTER — Encounter: Payer: Medicare HMO | Admitting: Physician Assistant

## 2021-02-10 DIAGNOSIS — E1151 Type 2 diabetes mellitus with diabetic peripheral angiopathy without gangrene: Secondary | ICD-10-CM | POA: Diagnosis not present

## 2021-02-10 DIAGNOSIS — L98492 Non-pressure chronic ulcer of skin of other sites with fat layer exposed: Secondary | ICD-10-CM | POA: Diagnosis not present

## 2021-02-10 DIAGNOSIS — I10 Essential (primary) hypertension: Secondary | ICD-10-CM | POA: Diagnosis not present

## 2021-02-10 DIAGNOSIS — N611 Abscess of the breast and nipple: Secondary | ICD-10-CM | POA: Diagnosis not present

## 2021-02-10 DIAGNOSIS — E11622 Type 2 diabetes mellitus with other skin ulcer: Secondary | ICD-10-CM | POA: Diagnosis not present

## 2021-02-10 NOTE — Progress Notes (Addendum)
AVALEE, CASTRELLON ([id number]) Visit Report for 02/10/2021 Chief Complaint Document Details Patient Name: Angel Reynolds, Angel Reynolds. Date of Service: 02/10/2021 12:45 PM Medical Record Number: [id number] Patient Account Number: [id number] Date of Birth/Sex: 03-03-1956 (65 y.o. F) Treating RN: Yevonne Pax Primary Care Provider: Simone Curia Other Clinician: Lolita Cram Referring Provider: Simone Curia Treating Provider/Extender: Rowan Blase in Treatment: 2 Information Obtained from: Patient Chief Complaint Right breast cellulitis abscess Electronic Signature(s) Signed: 02/10/2021 12:56:38 PM By: Lenda Kelp PA-C Entered By: Lenda Kelp on 02/10/2021 12:56:37 Angel Reynolds ([id number]) -------------------------------------------------------------------------------- Debridement Details Patient Name: Angel Reynolds. Date of Service: 02/10/2021 12:45 PM Medical Record Number: [id number] Patient Account Number: [id number] Date of Birth/Sex: [birth year] (65 y.o. F) Treating RN: Yevonne Pax Primary Care Provider: Simone Curia Other Clinician: Lolita Cram Referring Provider: Simone Curia Treating Provider/Extender: Rowan Blase in Treatment: 2 Debridement Performed for Wound #1 Right Breast Assessment: Performed By: Physician Nelida Meuse., PA-C Debridement Type: Debridement Level of Consciousness (Pre- Awake and Alert procedure): Pre-procedure Verification/Time Out Yes - 13:15 Taken: Start Time: 13:15 Pain Control: Lidocaine 4% Topical Solution Total Area Debrided (L x W): 0.3 (cm) x 2.8 (cm) = 0.84 (cm) Tissue and other material Viable, Non-Viable, Slough, Subcutaneous, Skin: Dermis , Skin: Epidermis, Slough debrided: Level: Skin/Subcutaneous Tissue Debridement Description: Excisional Instrument: Blade, Forceps Bleeding: Moderate Hemostasis Achieved: Pressure End Time: 13:20 Procedural Pain: 3 Post Procedural Pain: 0 Response to Treatment: Procedure was  tolerated well Level of Consciousness (Post- Awake and Alert procedure): Post Debridement Measurements of Total Wound Length: (cm) 0.7 Width: (cm) 2.8 Depth: (cm) 0.4 Volume: (cm) 0.616 Character of Wound/Ulcer Post Debridement: Improved Post Procedure Diagnosis Same as Pre-procedure Electronic Signature(s) Signed: 02/10/2021 5:24:00 PM By: Lenda Kelp PA-C Signed: 02/13/2021 7:55:29 AM By: Yevonne Pax RN Entered By: Yevonne Pax on 02/10/2021 13:19:05 Angel Reynolds ([id number]) -------------------------------------------------------------------------------- HPI Details Patient Name: Angel Reynolds. Date of Service: 02/10/2021 12:45 PM Medical Record Number: [id number] Patient Account Number: [id number] Date of Birth/Sex: 09/15/1956 (65 y.o. F) Treating RN: Yevonne Pax Primary Care Provider: Simone Curia Other Clinician: Lolita Cram Referring Provider: Simone Curia Treating Provider/Extender: Rowan Blase in Treatment: 2 History of Present Illness HPI Description: 01/27/2021 upon evaluation today patient appears to be doing somewhat poorly in regard to an abscess over the area just inferior to her right breast. Subsequently she tells me that this started about a month ago initially it was a raised/bumped area that then she put some drawing salve on it and it began to drain. Subsequently she did see her primary care provider and she was placed initially on a 10-day course of doxycycline which then was extended so that she would have this for at least 14 days maybe a little longer I am not sure when the first course was given to her. Nonetheless she has enough to make it through till Monday which is good news. Nonetheless unfortunately she tells me that this is still giving her quite a bit of discomfort is also draining quite a bit she just put a little bit of antibiotic ointment on it and covering with a dressing if anything. With that being said I think that this probably  represents something needs to be cleaned out probably is significantly deeper than what is obvious just on initial inspection. The patient does have a history of diabetes mellitus type 2, hypertension, and otherwise tells me she is never had anything quite like this happen before. 02/03/21 upon evaluation today  AVALEE, CASTRELLON ([id number]) Visit Report for 02/10/2021 Chief Complaint Document Details Patient Name: Angel Reynolds, Angel Reynolds. Date of Service: 02/10/2021 12:45 PM Medical Record Number: [id number] Patient Account Number: [id number] Date of Birth/Sex: 03-03-1956 (65 y.o. F) Treating RN: Yevonne Pax Primary Care Provider: Simone Curia Other Clinician: Lolita Cram Referring Provider: Simone Curia Treating Provider/Extender: Rowan Blase in Treatment: 2 Information Obtained from: Patient Chief Complaint Right breast cellulitis abscess Electronic Signature(s) Signed: 02/10/2021 12:56:38 PM By: Lenda Kelp PA-C Entered By: Lenda Kelp on 02/10/2021 12:56:37 Angel Reynolds ([id number]) -------------------------------------------------------------------------------- Debridement Details Patient Name: Angel Reynolds. Date of Service: 02/10/2021 12:45 PM Medical Record Number: [id number] Patient Account Number: [id number] Date of Birth/Sex: [birth year] (65 y.o. F) Treating RN: Yevonne Pax Primary Care Provider: Simone Curia Other Clinician: Lolita Cram Referring Provider: Simone Curia Treating Provider/Extender: Rowan Blase in Treatment: 2 Debridement Performed for Wound #1 Right Breast Assessment: Performed By: Physician Nelida Meuse., PA-C Debridement Type: Debridement Level of Consciousness (Pre- Awake and Alert procedure): Pre-procedure Verification/Time Out Yes - 13:15 Taken: Start Time: 13:15 Pain Control: Lidocaine 4% Topical Solution Total Area Debrided (L x W): 0.3 (cm) x 2.8 (cm) = 0.84 (cm) Tissue and other material Viable, Non-Viable, Slough, Subcutaneous, Skin: Dermis , Skin: Epidermis, Slough debrided: Level: Skin/Subcutaneous Tissue Debridement Description: Excisional Instrument: Blade, Forceps Bleeding: Moderate Hemostasis Achieved: Pressure End Time: 13:20 Procedural Pain: 3 Post Procedural Pain: 0 Response to Treatment: Procedure was  tolerated well Level of Consciousness (Post- Awake and Alert procedure): Post Debridement Measurements of Total Wound Length: (cm) 0.7 Width: (cm) 2.8 Depth: (cm) 0.4 Volume: (cm) 0.616 Character of Wound/Ulcer Post Debridement: Improved Post Procedure Diagnosis Same as Pre-procedure Electronic Signature(s) Signed: 02/10/2021 5:24:00 PM By: Lenda Kelp PA-C Signed: 02/13/2021 7:55:29 AM By: Yevonne Pax RN Entered By: Yevonne Pax on 02/10/2021 13:19:05 Angel Reynolds ([id number]) -------------------------------------------------------------------------------- HPI Details Patient Name: Angel Reynolds. Date of Service: 02/10/2021 12:45 PM Medical Record Number: [id number] Patient Account Number: [id number] Date of Birth/Sex: 09/15/1956 (65 y.o. F) Treating RN: Yevonne Pax Primary Care Provider: Simone Curia Other Clinician: Lolita Cram Referring Provider: Simone Curia Treating Provider/Extender: Rowan Blase in Treatment: 2 History of Present Illness HPI Description: 01/27/2021 upon evaluation today patient appears to be doing somewhat poorly in regard to an abscess over the area just inferior to her right breast. Subsequently she tells me that this started about a month ago initially it was a raised/bumped area that then she put some drawing salve on it and it began to drain. Subsequently she did see her primary care provider and she was placed initially on a 10-day course of doxycycline which then was extended so that she would have this for at least 14 days maybe a little longer I am not sure when the first course was given to her. Nonetheless she has enough to make it through till Monday which is good news. Nonetheless unfortunately she tells me that this is still giving her quite a bit of discomfort is also draining quite a bit she just put a little bit of antibiotic ointment on it and covering with a dressing if anything. With that being said I think that this probably  represents something needs to be cleaned out probably is significantly deeper than what is obvious just on initial inspection. The patient does have a history of diabetes mellitus type 2, hypertension, and otherwise tells me she is never had anything quite like this happen before. 02/03/21 upon evaluation today  AVALEE, CASTRELLON ([id number]) Visit Report for 02/10/2021 Chief Complaint Document Details Patient Name: Angel Reynolds, Angel Reynolds. Date of Service: 02/10/2021 12:45 PM Medical Record Number: [id number] Patient Account Number: [id number] Date of Birth/Sex: 03-03-1956 (65 y.o. F) Treating RN: Yevonne Pax Primary Care Provider: Simone Curia Other Clinician: Lolita Cram Referring Provider: Simone Curia Treating Provider/Extender: Rowan Blase in Treatment: 2 Information Obtained from: Patient Chief Complaint Right breast cellulitis abscess Electronic Signature(s) Signed: 02/10/2021 12:56:38 PM By: Lenda Kelp PA-C Entered By: Lenda Kelp on 02/10/2021 12:56:37 Angel Reynolds ([id number]) -------------------------------------------------------------------------------- Debridement Details Patient Name: Angel Reynolds. Date of Service: 02/10/2021 12:45 PM Medical Record Number: [id number] Patient Account Number: [id number] Date of Birth/Sex: [birth year] (65 y.o. F) Treating RN: Yevonne Pax Primary Care Provider: Simone Curia Other Clinician: Lolita Cram Referring Provider: Simone Curia Treating Provider/Extender: Rowan Blase in Treatment: 2 Debridement Performed for Wound #1 Right Breast Assessment: Performed By: Physician Nelida Meuse., PA-C Debridement Type: Debridement Level of Consciousness (Pre- Awake and Alert procedure): Pre-procedure Verification/Time Out Yes - 13:15 Taken: Start Time: 13:15 Pain Control: Lidocaine 4% Topical Solution Total Area Debrided (L x W): 0.3 (cm) x 2.8 (cm) = 0.84 (cm) Tissue and other material Viable, Non-Viable, Slough, Subcutaneous, Skin: Dermis , Skin: Epidermis, Slough debrided: Level: Skin/Subcutaneous Tissue Debridement Description: Excisional Instrument: Blade, Forceps Bleeding: Moderate Hemostasis Achieved: Pressure End Time: 13:20 Procedural Pain: 3 Post Procedural Pain: 0 Response to Treatment: Procedure was  tolerated well Level of Consciousness (Post- Awake and Alert procedure): Post Debridement Measurements of Total Wound Length: (cm) 0.7 Width: (cm) 2.8 Depth: (cm) 0.4 Volume: (cm) 0.616 Character of Wound/Ulcer Post Debridement: Improved Post Procedure Diagnosis Same as Pre-procedure Electronic Signature(s) Signed: 02/10/2021 5:24:00 PM By: Lenda Kelp PA-C Signed: 02/13/2021 7:55:29 AM By: Yevonne Pax RN Entered By: Yevonne Pax on 02/10/2021 13:19:05 Angel Reynolds ([id number]) -------------------------------------------------------------------------------- HPI Details Patient Name: Angel Reynolds. Date of Service: 02/10/2021 12:45 PM Medical Record Number: [id number] Patient Account Number: [id number] Date of Birth/Sex: 09/15/1956 (65 y.o. F) Treating RN: Yevonne Pax Primary Care Provider: Simone Curia Other Clinician: Lolita Cram Referring Provider: Simone Curia Treating Provider/Extender: Rowan Blase in Treatment: 2 History of Present Illness HPI Description: 01/27/2021 upon evaluation today patient appears to be doing somewhat poorly in regard to an abscess over the area just inferior to her right breast. Subsequently she tells me that this started about a month ago initially it was a raised/bumped area that then she put some drawing salve on it and it began to drain. Subsequently she did see her primary care provider and she was placed initially on a 10-day course of doxycycline which then was extended so that she would have this for at least 14 days maybe a little longer I am not sure when the first course was given to her. Nonetheless she has enough to make it through till Monday which is good news. Nonetheless unfortunately she tells me that this is still giving her quite a bit of discomfort is also draining quite a bit she just put a little bit of antibiotic ointment on it and covering with a dressing if anything. With that being said I think that this probably  represents something needs to be cleaned out probably is significantly deeper than what is obvious just on initial inspection. The patient does have a history of diabetes mellitus type 2, hypertension, and otherwise tells me she is never had anything quite like this happen before. 02/03/21 upon evaluation today  Dressing: Bordered Gauze Sterile-HBD 4x4 (in/in) (Generic) 1 x Per Day/30 Days Discharge Instructions: Cover wound with Bordered Guaze Sterile as directed Electronic Signature(s) Signed: 02/10/2021 5:24:00 PM By: Lenda Kelp PA-C Signed: 02/13/2021 7:55:29 AM By: Yevonne Pax RN Entered By: Yevonne Pax on 02/10/2021 13:20:12 Angel Reynolds ([id number]) -------------------------------------------------------------------------------- Problem List Details Patient Name: Angel Reynolds. Date of Service: 02/10/2021 12:45 PM Medical Record Number: [id number] Patient Account Number: [id number] Date of Birth/Sex: 1955/11/06 (65 y.o. F) Treating RN: Yevonne Pax Primary Care Provider: Simone Curia Other Clinician: Lolita Cram Referring Provider: Simone Curia Treating Provider/Extender: Rowan Blase in Treatment: 2 Active Problems ICD-10 Encounter Code Description Active Date MDM Diagnosis L02.818 Cutaneous abscess of other sites 01/27/2021 No Yes L98.492 Non-pressure chronic ulcer of skin of other sites with fat layer exposed 01/27/2021 No Yes I10 Essential (primary) hypertension 01/27/2021 No Yes E11.622 Type 2 diabetes mellitus with other skin ulcer 01/27/2021 No Yes Inactive Problems Resolved Problems Electronic Signature(s) Signed: 02/10/2021 12:56:31 PM By: Lenda Kelp PA-C Entered By: Lenda Kelp on 02/10/2021 12:56:31 Angel Reynolds  ([id number]) -------------------------------------------------------------------------------- Progress Note Details Patient Name: Angel Reynolds. Date of Service: 02/10/2021 12:45 PM Medical Record Number: [id number] Patient Account Number: [id number] Date of Birth/Sex: 1956/09/29 (65 y.o. F) Treating RN: Yevonne Pax Primary Care Provider: Simone Curia Other Clinician: Lolita Cram Referring Provider: Simone Curia Treating Provider/Extender: Rowan Blase in Treatment: 2 Subjective Chief Complaint Information obtained from Patient Right breast cellulitis abscess History of Present Illness (HPI) 01/27/2021 upon evaluation today patient appears to be doing somewhat poorly in regard to an abscess over the area just inferior to her right breast. Subsequently she tells me that this started about a month ago initially it was a raised/bumped area that then she put some drawing salve on it and it began to drain. Subsequently she did see her primary care provider and she was placed initially on a 10-day course of doxycycline which then was extended so that she would have this for at least 14 days maybe a little longer I am not sure when the first course was given to her. Nonetheless she has enough to make it through till Monday which is good news. Nonetheless unfortunately she tells me that this is still giving her quite a bit of discomfort is also draining quite a bit she just put a little bit of antibiotic ointment on it and covering with a dressing if anything. With that being said I think that this probably represents something needs to be cleaned out probably is significantly deeper than what is obvious just on initial inspection. The patient does have a history of diabetes mellitus type 2, hypertension, and otherwise tells me she is never had anything quite like this happen before. 02/03/21 upon evaluation today patient actually is making excellent progress in regard to her wound. Fortunately  there does not appear to be any signs of active infection which is great news and overall very pleased in that regard. I do feel like the patient needs to have some Santyl to help clean up the wound edges. I am really somewhat reluctant to perform a lot of debridement here as I am afraid that this can cause more damage to the area that is trying to heal already. Nonetheless I think we can use Santyl along the edges of the wound to try to help clean this up a little better. 02/10/21 upon evaluation today patient appears to be doing decently well in regard to her  AVALEE, CASTRELLON ([id number]) Visit Report for 02/10/2021 Chief Complaint Document Details Patient Name: Angel Reynolds, Angel Reynolds. Date of Service: 02/10/2021 12:45 PM Medical Record Number: [id number] Patient Account Number: [id number] Date of Birth/Sex: 03-03-1956 (65 y.o. F) Treating RN: Yevonne Pax Primary Care Provider: Simone Curia Other Clinician: Lolita Cram Referring Provider: Simone Curia Treating Provider/Extender: Rowan Blase in Treatment: 2 Information Obtained from: Patient Chief Complaint Right breast cellulitis abscess Electronic Signature(s) Signed: 02/10/2021 12:56:38 PM By: Lenda Kelp PA-C Entered By: Lenda Kelp on 02/10/2021 12:56:37 Angel Reynolds ([id number]) -------------------------------------------------------------------------------- Debridement Details Patient Name: Angel Reynolds. Date of Service: 02/10/2021 12:45 PM Medical Record Number: [id number] Patient Account Number: [id number] Date of Birth/Sex: [birth year] (65 y.o. F) Treating RN: Yevonne Pax Primary Care Provider: Simone Curia Other Clinician: Lolita Cram Referring Provider: Simone Curia Treating Provider/Extender: Rowan Blase in Treatment: 2 Debridement Performed for Wound #1 Right Breast Assessment: Performed By: Physician Nelida Meuse., PA-C Debridement Type: Debridement Level of Consciousness (Pre- Awake and Alert procedure): Pre-procedure Verification/Time Out Yes - 13:15 Taken: Start Time: 13:15 Pain Control: Lidocaine 4% Topical Solution Total Area Debrided (L x W): 0.3 (cm) x 2.8 (cm) = 0.84 (cm) Tissue and other material Viable, Non-Viable, Slough, Subcutaneous, Skin: Dermis , Skin: Epidermis, Slough debrided: Level: Skin/Subcutaneous Tissue Debridement Description: Excisional Instrument: Blade, Forceps Bleeding: Moderate Hemostasis Achieved: Pressure End Time: 13:20 Procedural Pain: 3 Post Procedural Pain: 0 Response to Treatment: Procedure was  tolerated well Level of Consciousness (Post- Awake and Alert procedure): Post Debridement Measurements of Total Wound Length: (cm) 0.7 Width: (cm) 2.8 Depth: (cm) 0.4 Volume: (cm) 0.616 Character of Wound/Ulcer Post Debridement: Improved Post Procedure Diagnosis Same as Pre-procedure Electronic Signature(s) Signed: 02/10/2021 5:24:00 PM By: Lenda Kelp PA-C Signed: 02/13/2021 7:55:29 AM By: Yevonne Pax RN Entered By: Yevonne Pax on 02/10/2021 13:19:05 Angel Reynolds ([id number]) -------------------------------------------------------------------------------- HPI Details Patient Name: Angel Reynolds. Date of Service: 02/10/2021 12:45 PM Medical Record Number: [id number] Patient Account Number: [id number] Date of Birth/Sex: 09/15/1956 (65 y.o. F) Treating RN: Yevonne Pax Primary Care Provider: Simone Curia Other Clinician: Lolita Cram Referring Provider: Simone Curia Treating Provider/Extender: Rowan Blase in Treatment: 2 History of Present Illness HPI Description: 01/27/2021 upon evaluation today patient appears to be doing somewhat poorly in regard to an abscess over the area just inferior to her right breast. Subsequently she tells me that this started about a month ago initially it was a raised/bumped area that then she put some drawing salve on it and it began to drain. Subsequently she did see her primary care provider and she was placed initially on a 10-day course of doxycycline which then was extended so that she would have this for at least 14 days maybe a little longer I am not sure when the first course was given to her. Nonetheless she has enough to make it through till Monday which is good news. Nonetheless unfortunately she tells me that this is still giving her quite a bit of discomfort is also draining quite a bit she just put a little bit of antibiotic ointment on it and covering with a dressing if anything. With that being said I think that this probably  represents something needs to be cleaned out probably is significantly deeper than what is obvious just on initial inspection. The patient does have a history of diabetes mellitus type 2, hypertension, and otherwise tells me she is never had anything quite like this happen before. 02/03/21 upon evaluation today

## 2021-02-14 NOTE — Progress Notes (Signed)
have a volume reduction of 80% by week 12 Date Initiated: 01/27/2021 Target Resolution Date: 05/29/2021 ORENE, ABBASI ([id number]) Goal Status: Active Ulcer/skin breakdown will heal within 14 weeks Date Initiated: 01/27/2021 Target Resolution Date: 06/29/2021 Goal Status: Active Interventions: Assess patient/caregiver ability to obtain necessary supplies Assess patient/caregiver ability to perform ulcer/skin care regimen upon admission and as needed Assess ulceration(s) every visit Notes: Electronic Signature(s) Signed: 02/13/2021 7:55:29 AM By: Yevonne Pax RN Entered By: Yevonne Pax on 02/10/2021 13:14:44 Angel Reynolds ([id number]) -------------------------------------------------------------------------------- Pain Assessment Details Patient Name: Angel Reynolds. Date of Service: 02/10/2021 12:45 PM Medical Record Number: [id number] Patient Account Number: [id number] Date of Birth/Sex: 06-10-1956 (64 y.o. F) Treating RN: Yevonne Pax Primary Care Fynley Chrystal: Simone Curia Other Clinician: Lolita Cram Referring Dylin Breeden: Simone Curia Treating Celenia Hruska/Extender: Rowan Blase in Treatment: 2 Active  Problems Location of Pain Severity and Description of Pain Patient Has Paino No Site Locations Rate the pain. Current Pain Level: 0 Pain Management and Medication Current Pain Management: Electronic Signature(s) Signed: 02/13/2021 7:55:29 AM By: Yevonne Pax RN Signed: 02/14/2021 8:32:14 AM By: Lolita Cram Entered By: Lolita Cram on 02/10/2021 12:46:29 Angel Reynolds ([id number]) -------------------------------------------------------------------------------- Patient/Caregiver Education Details Patient Name: Angel Reynolds. Date of Service: 02/10/2021 12:45 PM Medical Record Number: [id number] Patient Account Number: [id number] Date of Birth/Gender: [birth year] (64 y.o. F) Treating RN: Yevonne Pax Primary Care Physician: Simone Curia Other Clinician: Lolita Cram Referring Physician: Simone Curia Treating Physician/Extender: Rowan Blase in Treatment: 2 Education Assessment Education Provided To: Patient Education Topics Provided Wound/Skin Impairment: Methods: Explain/Verbal Responses: State content correctly Electronic Signature(s) Signed: 02/13/2021 7:55:29 AM By: Yevonne Pax RN Entered By: Yevonne Pax on 02/10/2021 13:22:52 Angel Reynolds ([id number]) -------------------------------------------------------------------------------- Wound Assessment Details Patient Name: Angel Reynolds. Date of Service: 02/10/2021 12:45 PM Medical Record Number: [id number] Patient Account Number: [id number] Date of Birth/Sex: 1956/03/29 (64 y.o. F) Treating RN: Yevonne Pax Primary Care Shi Blankenship: Simone Curia Other Clinician: Lolita Cram Referring Latiana Tomei: Simone Curia Treating Dishawn Bhargava/Extender: Rowan Blase in Treatment: 2 Wound Status Wound Number: 1 Primary Etiology: Abscess Wound Location: Right Breast Wound Status: Open Wounding Event: Gradually Appeared Comorbid History: Hypertension, Type II Diabetes, Osteoarthritis Date Acquired: 12/30/2020 Weeks Of  Treatment: 2 Clustered Wound: No Photos Wound Measurements Length: (cm) 0.7 Width: (cm) 2.8 Depth: (cm) 0.4 Area: (cm) 1.539 Volume: (cm) 0.616 % Reduction in Area: 55.9% % Reduction in Volume: 70.6% Epithelialization: None Tunneling: No Undermining: No Wound Description Classification: Full Thickness Without Exposed Support Structu Exudate Amount: Medium Exudate Type: Sanguinous Exudate Color: red res Foul Odor After Cleansing: No Slough/Fibrino Yes Wound Bed Granulation Amount: Medium (34-66%) Exposed Structure Granulation Quality: Red Fascia Exposed: No Necrotic Amount: Medium (34-66%) Fat Layer (Subcutaneous Tissue) Exposed: Yes Necrotic Quality: Adherent Slough Tendon Exposed: No Muscle Exposed: No Joint Exposed: No Bone Exposed: No Electronic Signature(s) Signed: 02/13/2021 7:55:29 AM By: Yevonne Pax RN Signed: 02/14/2021 8:32:14 AM By: Lolita Cram Entered By: Lolita Cram on 02/10/2021 12:54:33 Angel Reynolds ([id number]) -------------------------------------------------------------------------------- Vitals Details Patient Name: Angel Reynolds. Date of Service: 02/10/2021 12:45 PM Medical Record Number: [id number] Patient Account Number: [id number] Date of Birth/Sex: 10-26-1956 (64 y.o. F) Treating RN: Yevonne Pax Primary Care Serria Sloma: Simone Curia Other Clinician: Lolita Cram Referring Yui Mulvaney: Simone Curia Treating Janijah Symons/Extender: Rowan Blase in Treatment: 2 Vital Signs Time Taken: 12:45 Temperature (F): 98.4 Height (in): 62 Pulse (bpm): 90 Weight (lbs): 177 Respiratory Rate (breaths/min): 18 Body Mass Index (BMI): 32.4 Blood Pressure (mmHg): 131/80 Reference Range:  have a volume reduction of 80% by week 12 Date Initiated: 01/27/2021 Target Resolution Date: 05/29/2021 ORENE, ABBASI ([id number]) Goal Status: Active Ulcer/skin breakdown will heal within 14 weeks Date Initiated: 01/27/2021 Target Resolution Date: 06/29/2021 Goal Status: Active Interventions: Assess patient/caregiver ability to obtain necessary supplies Assess patient/caregiver ability to perform ulcer/skin care regimen upon admission and as needed Assess ulceration(s) every visit Notes: Electronic Signature(s) Signed: 02/13/2021 7:55:29 AM By: Yevonne Pax RN Entered By: Yevonne Pax on 02/10/2021 13:14:44 Angel Reynolds ([id number]) -------------------------------------------------------------------------------- Pain Assessment Details Patient Name: Angel Reynolds. Date of Service: 02/10/2021 12:45 PM Medical Record Number: [id number] Patient Account Number: [id number] Date of Birth/Sex: 06-10-1956 (64 y.o. F) Treating RN: Yevonne Pax Primary Care Fynley Chrystal: Simone Curia Other Clinician: Lolita Cram Referring Dylin Breeden: Simone Curia Treating Celenia Hruska/Extender: Rowan Blase in Treatment: 2 Active  Problems Location of Pain Severity and Description of Pain Patient Has Paino No Site Locations Rate the pain. Current Pain Level: 0 Pain Management and Medication Current Pain Management: Electronic Signature(s) Signed: 02/13/2021 7:55:29 AM By: Yevonne Pax RN Signed: 02/14/2021 8:32:14 AM By: Lolita Cram Entered By: Lolita Cram on 02/10/2021 12:46:29 Angel Reynolds ([id number]) -------------------------------------------------------------------------------- Patient/Caregiver Education Details Patient Name: Angel Reynolds. Date of Service: 02/10/2021 12:45 PM Medical Record Number: [id number] Patient Account Number: [id number] Date of Birth/Gender: [birth year] (64 y.o. F) Treating RN: Yevonne Pax Primary Care Physician: Simone Curia Other Clinician: Lolita Cram Referring Physician: Simone Curia Treating Physician/Extender: Rowan Blase in Treatment: 2 Education Assessment Education Provided To: Patient Education Topics Provided Wound/Skin Impairment: Methods: Explain/Verbal Responses: State content correctly Electronic Signature(s) Signed: 02/13/2021 7:55:29 AM By: Yevonne Pax RN Entered By: Yevonne Pax on 02/10/2021 13:22:52 Angel Reynolds ([id number]) -------------------------------------------------------------------------------- Wound Assessment Details Patient Name: Angel Reynolds. Date of Service: 02/10/2021 12:45 PM Medical Record Number: [id number] Patient Account Number: [id number] Date of Birth/Sex: 1956/03/29 (64 y.o. F) Treating RN: Yevonne Pax Primary Care Shi Blankenship: Simone Curia Other Clinician: Lolita Cram Referring Latiana Tomei: Simone Curia Treating Dishawn Bhargava/Extender: Rowan Blase in Treatment: 2 Wound Status Wound Number: 1 Primary Etiology: Abscess Wound Location: Right Breast Wound Status: Open Wounding Event: Gradually Appeared Comorbid History: Hypertension, Type II Diabetes, Osteoarthritis Date Acquired: 12/30/2020 Weeks Of  Treatment: 2 Clustered Wound: No Photos Wound Measurements Length: (cm) 0.7 Width: (cm) 2.8 Depth: (cm) 0.4 Area: (cm) 1.539 Volume: (cm) 0.616 % Reduction in Area: 55.9% % Reduction in Volume: 70.6% Epithelialization: None Tunneling: No Undermining: No Wound Description Classification: Full Thickness Without Exposed Support Structu Exudate Amount: Medium Exudate Type: Sanguinous Exudate Color: red res Foul Odor After Cleansing: No Slough/Fibrino Yes Wound Bed Granulation Amount: Medium (34-66%) Exposed Structure Granulation Quality: Red Fascia Exposed: No Necrotic Amount: Medium (34-66%) Fat Layer (Subcutaneous Tissue) Exposed: Yes Necrotic Quality: Adherent Slough Tendon Exposed: No Muscle Exposed: No Joint Exposed: No Bone Exposed: No Electronic Signature(s) Signed: 02/13/2021 7:55:29 AM By: Yevonne Pax RN Signed: 02/14/2021 8:32:14 AM By: Lolita Cram Entered By: Lolita Cram on 02/10/2021 12:54:33 Angel Reynolds ([id number]) -------------------------------------------------------------------------------- Vitals Details Patient Name: Angel Reynolds. Date of Service: 02/10/2021 12:45 PM Medical Record Number: [id number] Patient Account Number: [id number] Date of Birth/Sex: 10-26-1956 (64 y.o. F) Treating RN: Yevonne Pax Primary Care Serria Sloma: Simone Curia Other Clinician: Lolita Cram Referring Yui Mulvaney: Simone Curia Treating Janijah Symons/Extender: Rowan Blase in Treatment: 2 Vital Signs Time Taken: 12:45 Temperature (F): 98.4 Height (in): 62 Pulse (bpm): 90 Weight (lbs): 177 Respiratory Rate (breaths/min): 18 Body Mass Index (BMI): 32.4 Blood Pressure (mmHg): 131/80 Reference Range:  Angel ContesFLINCHUM, Christasia M. ([id number]) Visit Report for 02/10/2021 Arrival Information Details Patient Name: Angel ContesFLINCHUM, Ettamae M. Date of Service: 02/10/2021 12:45 PM Medical Record Number: [id number] Patient Account Number: [id number] Date of Birth/Sex: [birth year] (64 y.o. F) Treating RN: Yevonne PaxEpps, Carrie Primary Care Haelee Bolen: Simone CuriaLEE, KEUNG Other Clinician: Lolita CramBurnette, Kyara Referring Jacques Willingham: Simone CuriaLEE, KEUNG Treating Emon Lance/Extender: Rowan BlaseStone, Hoyt Weeks in Treatment: 2 Visit Information History Since Last Visit Added or deleted any medications: No Patient Arrived: Ambulatory Had a fall or experienced change in No Arrival Time: 12:43 activities of daily living that may affect Accompanied By: husband risk of falls: Transfer Assistance: None Hospitalized since last visit: No Patient Identification Verified: Yes Pain Present Now: No Secondary Verification Process Completed: Yes Patient Has Alerts: Yes Patient Alerts: Type II Diabetic Electronic Signature(s) Signed: 02/14/2021 8:32:14 AM By: Lolita CramBurnette, Kyara Entered By: Lolita CramBurnette, Kyara on 02/10/2021 12:44:35 Angel ContesFLINCHUM, Ajee M. ([id number]) -------------------------------------------------------------------------------- Clinic Level of Care Assessment Details Patient Name: Angel ContesFLINCHUM, [place] M. Date of Service: 02/10/2021 12:45 PM Medical Record Number: [id number] Patient Account Number: [id number] Date of Birth/Sex: [birth year] (64 y.o. F) Treating RN: Yevonne PaxEpps, Carrie Primary Care Leona Pressly: Simone CuriaLEE, KEUNG Other Clinician: Lolita CramBurnette, Kyara Referring Infinity Jeffords: Simone CuriaLEE, KEUNG Treating [place]iyah Vessel/Extender: Rowan BlaseStone, Hoyt Weeks in Treatment: 2 Clinic Level of Care Assessment Items TOOL 1 Quantity Score []  - Use when EandM and Procedure is performed on INITIAL visit 0 ASSESSMENTS - Nursing Assessment / Reassessment []  - General Physical Exam (combine w/ comprehensive assessment (listed just below) when performed on new 0 pt. evals) []  - 0 Comprehensive Assessment (HX, ROS, Risk  Assessments, Wounds Hx, etc.) ASSESSMENTS - Wound and Skin Assessment / Reassessment []  - Dermatologic / Skin Assessment (not related to wound area) 0 ASSESSMENTS - Ostomy and/or Continence Assessment and Care []  - Incontinence Assessment and Management 0 []  - 0 Ostomy Care Assessment and Management (repouching, etc.) PROCESS - Coordination of Care []  - Simple Patient / Family Education for ongoing care 0 []  - 0 Complex (extensive) Patient / Family Education for ongoing care []  - 0 Staff obtains ChiropractorConsents, Records, Test Results / Process Orders []  - 0 Staff telephones HHA, Nursing Homes / Clarify orders / etc []  - 0 Routine Transfer to another Facility (non-emergent condition) []  - 0 Routine Hospital Admission (non-emergent condition) []  - 0 New Admissions / Manufacturing engineernsurance Authorizations / Ordering NPWT, Apligraf, etc. []  - 0 Emergency Hospital Admission (emergent condition) PROCESS - Special Needs []  - Pediatric / Minor Patient Management 0 []  - 0 Isolation Patient Management []  - 0 Hearing / Language / Visual special needs []  - 0 Assessment of Community assistance (transportation, D/C planning, etc.) []  - 0 Additional assistance / Altered mentation []  - 0 Support Surface(s) Assessment (bed, cushion, seat, etc.) INTERVENTIONS - Miscellaneous []  - External ear exam 0 []  - 0 Patient Transfer (multiple staff / Nurse, adultHoyer Lift / Similar devices) []  - 0 Simple Staple / Suture removal (25 or less) []  - 0 Complex Staple / Suture removal (26 or more) []  - 0 Hypo/Hyperglycemic Management (do not check if billed separately) []  - 0 Ankle / Brachial Index (ABI) - do not check if billed separately Has the patient been seen at the hospital within the last three years: Yes Total Score: 0 Level Of Care: ____ Angel ContesFLINCHUM, Evadna M. ([id number]) Electronic Signature(s) Signed: 02/13/2021 7:55:29 AM By: Yevonne PaxEpps, Carrie RN Entered By: Yevonne PaxEpps, Carrie on 02/10/2021 13:20:33 Angel ContesFLINCHUM, [place] M.  ([id number]) -------------------------------------------------------------------------------- Lower Extremity Assessment Details Patient Name: Angel ContesFLINCHUM, Amalia M. Date of Service: 02/10/2021 12:45 PM Medical Record Number:  Angel ContesFLINCHUM, Christasia M. ([id number]) Visit Report for 02/10/2021 Arrival Information Details Patient Name: Angel ContesFLINCHUM, Ettamae M. Date of Service: 02/10/2021 12:45 PM Medical Record Number: [id number] Patient Account Number: [id number] Date of Birth/Sex: [birth year] (64 y.o. F) Treating RN: Yevonne PaxEpps, Carrie Primary Care Haelee Bolen: Simone CuriaLEE, KEUNG Other Clinician: Lolita CramBurnette, Kyara Referring Jacques Willingham: Simone CuriaLEE, KEUNG Treating Emon Lance/Extender: Rowan BlaseStone, Hoyt Weeks in Treatment: 2 Visit Information History Since Last Visit Added or deleted any medications: No Patient Arrived: Ambulatory Had a fall or experienced change in No Arrival Time: 12:43 activities of daily living that may affect Accompanied By: husband risk of falls: Transfer Assistance: None Hospitalized since last visit: No Patient Identification Verified: Yes Pain Present Now: No Secondary Verification Process Completed: Yes Patient Has Alerts: Yes Patient Alerts: Type II Diabetic Electronic Signature(s) Signed: 02/14/2021 8:32:14 AM By: Lolita CramBurnette, Kyara Entered By: Lolita CramBurnette, Kyara on 02/10/2021 12:44:35 Angel ContesFLINCHUM, Ajee M. ([id number]) -------------------------------------------------------------------------------- Clinic Level of Care Assessment Details Patient Name: Angel ContesFLINCHUM, [place] M. Date of Service: 02/10/2021 12:45 PM Medical Record Number: [id number] Patient Account Number: [id number] Date of Birth/Sex: [birth year] (64 y.o. F) Treating RN: Yevonne PaxEpps, Carrie Primary Care Leona Pressly: Simone CuriaLEE, KEUNG Other Clinician: Lolita CramBurnette, Kyara Referring Infinity Jeffords: Simone CuriaLEE, KEUNG Treating Maziyah Vessel/Extender: Rowan BlaseStone, Hoyt Weeks in Treatment: 2 Clinic Level of Care Assessment Items TOOL 1 Quantity Score []  - Use when EandM and Procedure is performed on INITIAL visit 0 ASSESSMENTS - Nursing Assessment / Reassessment []  - General Physical Exam (combine w/ comprehensive assessment (listed just below) when performed on new 0 pt. evals) []  - 0 Comprehensive Assessment (HX, ROS, Risk  Assessments, Wounds Hx, etc.) ASSESSMENTS - Wound and Skin Assessment / Reassessment []  - Dermatologic / Skin Assessment (not related to wound area) 0 ASSESSMENTS - Ostomy and/or Continence Assessment and Care []  - Incontinence Assessment and Management 0 []  - 0 Ostomy Care Assessment and Management (repouching, etc.) PROCESS - Coordination of Care []  - Simple Patient / Family Education for ongoing care 0 []  - 0 Complex (extensive) Patient / Family Education for ongoing care []  - 0 Staff obtains ChiropractorConsents, Records, Test Results / Process Orders []  - 0 Staff telephones HHA, Nursing Homes / Clarify orders / etc []  - 0 Routine Transfer to another Facility (non-emergent condition) []  - 0 Routine Hospital Admission (non-emergent condition) []  - 0 New Admissions / Manufacturing engineernsurance Authorizations / Ordering NPWT, Apligraf, etc. []  - 0 Emergency Hospital Admission (emergent condition) PROCESS - Special Needs []  - Pediatric / Minor Patient Management 0 []  - 0 Isolation Patient Management []  - 0 Hearing / Language / Visual special needs []  - 0 Assessment of Community assistance (transportation, D/C planning, etc.) []  - 0 Additional assistance / Altered mentation []  - 0 Support Surface(s) Assessment (bed, cushion, seat, etc.) INTERVENTIONS - Miscellaneous []  - External ear exam 0 []  - 0 Patient Transfer (multiple staff / Nurse, adultHoyer Lift / Similar devices) []  - 0 Simple Staple / Suture removal (25 or less) []  - 0 Complex Staple / Suture removal (26 or more) []  - 0 Hypo/Hyperglycemic Management (do not check if billed separately) []  - 0 Ankle / Brachial Index (ABI) - do not check if billed separately Has the patient been seen at the hospital within the last three years: Yes Total Score: 0 Level Of Care: ____ Angel ContesFLINCHUM, Evadna M. ([id number]) Electronic Signature(s) Signed: 02/13/2021 7:55:29 AM By: Yevonne PaxEpps, Carrie RN Entered By: Yevonne PaxEpps, Carrie on 02/10/2021 13:20:33 Angel ContesFLINCHUM, [place] M.  ([id number]) -------------------------------------------------------------------------------- Lower Extremity Assessment Details Patient Name: Angel ContesFLINCHUM, Amalia M. Date of Service: 02/10/2021 12:45 PM Medical Record Number:

## 2021-02-16 ENCOUNTER — Encounter: Payer: Medicare HMO | Admitting: Physician Assistant

## 2021-02-16 ENCOUNTER — Other Ambulatory Visit: Payer: Self-pay

## 2021-02-16 DIAGNOSIS — E11622 Type 2 diabetes mellitus with other skin ulcer: Secondary | ICD-10-CM | POA: Diagnosis not present

## 2021-02-16 DIAGNOSIS — I10 Essential (primary) hypertension: Secondary | ICD-10-CM | POA: Diagnosis not present

## 2021-02-16 DIAGNOSIS — L98492 Non-pressure chronic ulcer of skin of other sites with fat layer exposed: Secondary | ICD-10-CM | POA: Diagnosis not present

## 2021-02-16 DIAGNOSIS — E1151 Type 2 diabetes mellitus with diabetic peripheral angiopathy without gangrene: Secondary | ICD-10-CM | POA: Diagnosis not present

## 2021-02-16 DIAGNOSIS — N611 Abscess of the breast and nipple: Secondary | ICD-10-CM | POA: Diagnosis not present

## 2021-02-16 DIAGNOSIS — L03313 Cellulitis of chest wall: Secondary | ICD-10-CM | POA: Diagnosis not present

## 2021-02-16 NOTE — Progress Notes (Addendum)
Angel Reynolds, Angel M. ([id number]) Visit Report for 02/16/2021 Chief Complaint Document Details Patient Name: Angel Reynolds, Angel M. Date of Service: 02/16/2021 8:45 AM Medical Record Number: [id number] Patient Account Number: [id number] Date of Birth/Sex: [birth year] (65 y.o. F) Treating RN: Rogers BlockerSanchez, Kenia Primary Care Provider: Simone CuriaLEE, KEUNG Other Clinician: Lolita CramBurnette, Kyara Referring Provider: Simone CuriaLEE, KEUNG Treating Provider/Extender: Rowan BlaseStone, Druscilla Petsch Weeks in Treatment: 2 Information Obtained from: Patient Chief Complaint Right breast cellulitis abscess Electronic Signature(s) Signed: 02/16/2021 9:11:52 AM By: Lenda KelpStone III, Kymberly Blomberg PA-C Entered By: Lenda KelpStone III, Kelsey Durflinger on 02/16/2021 09:11:52 Angel Reynolds, Angel M. ([id number]) -------------------------------------------------------------------------------- HPI Details Patient Name: Angel Reynolds, Angel M. Date of Service: 02/16/2021 8:45 AM Medical Record Number: [id number] Patient Account Number: [id number] Date of Birth/Sex: [birth year] (65 y.o. F) Treating RN: Rogers BlockerSanchez, Kenia Primary Care Provider: Simone CuriaLEE, KEUNG Other Clinician: Lolita CramBurnette, Kyara Referring Provider: Simone CuriaLEE, KEUNG Treating Provider/Extender: Rowan BlaseStone, Shynice Sigel Weeks in Treatment: 2 History of Present Illness HPI Description: 01/27/2021 upon evaluation today patient appears to be doing somewhat poorly in regard to an abscess over the area just inferior to her right breast. Subsequently she tells me that this started about a month ago initially it was a raised/bumped area that then she put some drawing salve on it and it began to drain. Subsequently she did see her primary care provider and she was placed initially on a 10-day course of doxycycline which then was extended so that she would have this for at least 14 days maybe a little longer I am not sure when the first course was given to her. Nonetheless she has enough to make it through till Monday which is good news. Nonetheless unfortunately she tells me that this is still  giving her quite a bit of discomfort is also draining quite a bit she just put a little bit of antibiotic ointment on it and covering with a dressing if anything. With that being said I think that this probably represents something needs to be cleaned out probably is significantly deeper than what is obvious just on initial inspection. The patient does have a history of diabetes mellitus type 2, hypertension, and otherwise tells me she is never had anything quite like this happen before. 02/03/21 upon evaluation today patient actually is making excellent progress in regard to her wound. Fortunately there does not appear to be any signs of active infection which is great news and overall very pleased in that regard. I do feel like the patient needs to have some Santyl to help clean up the wound edges. I am really somewhat reluctant to perform a lot of debridement here as I am afraid that this can cause more damage to the area that is trying to heal already. Nonetheless I think we can use Santyl along the edges of the wound to try to help clean this up a little better. 02/10/21 upon evaluation today patient appears to be doing decently well in regard to her wound. She has been tolerating the dressing changes without complication. Fortunately there is no signs of active infection which is great news. She is using Santyl around the necrotic area and then Dakin's moistened gauze over top. 02/16/2021 upon evaluation today patient appears to be doing excellent in regard to her wound. She has been tolerating the dressing changes without complication. Fortunately there is no signs of active infection at this time which is great news. I do feel like that the Dakin's moistened gauze has done well as has the Santyl but I feel like it is time to move on to something  mellitus with other skin ulcer 01/27/2021 No Yes Inactive Problems Resolved Problems Electronic Signature(s) Signed: 02/16/2021 9:11:45 AM By: Lenda Kelp PA-C Entered By: Lenda Kelp on 02/16/2021 09:11:45 Angel Contes ([id number]) -------------------------------------------------------------------------------- Progress Note Details Patient Name: Angel Contes. Date of Service: 02/16/2021 8:45 AM Medical Record Number: [id number] Patient Account Number: [id number] Date of Birth/Sex: October 04, 1956 (65 y.o. F) Treating RN: Rogers Blocker Primary Care Provider: Simone Curia Other Clinician: Lolita Cram Referring Provider: Simone Curia Treating Provider/Extender: Rowan Blase in Treatment: 2 Subjective Chief Complaint Information obtained from Patient Right breast cellulitis abscess History of Present Illness (HPI) 01/27/2021 upon evaluation today patient appears to be doing somewhat poorly in regard to an abscess over the area just inferior to her right breast. Subsequently she tells me that this started about a month ago initially it was a raised/bumped area that then she put some drawing salve on it and it began to drain. Subsequently she did see her primary care provider and she was placed initially on a 10-day course of doxycycline which then was extended so that she would have this for at least 14 days maybe a little longer I am not sure when the first course was given to her. Nonetheless she has enough to make it through till Monday which is good news. Nonetheless unfortunately she tells me that this is still giving her quite a bit of discomfort is also draining quite  a bit she just put a little bit of antibiotic ointment on it and covering with a dressing if anything. With that being said I think that this probably represents something needs to be cleaned out probably is significantly deeper than what is obvious just on initial inspection. The patient does have a history of diabetes mellitus type 2, hypertension, and otherwise tells me she is never had anything quite like this happen before. 02/03/21 upon evaluation today patient actually is making excellent progress in regard to her wound. Fortunately there does not appear to be any signs of active infection which is great news and overall very pleased in that regard. I do feel like the patient needs to have some Santyl to help clean up the wound edges. I am really somewhat reluctant to perform a lot of debridement here as I am afraid that this can cause more damage to the area that is trying to heal already. Nonetheless I think we can use Santyl along the edges of the wound to try to help clean this up a little better. 02/10/21 upon evaluation today patient appears to be doing decently well in regard to her wound. She has been tolerating the dressing changes without complication. Fortunately there is no signs of active infection which is great news. She is using Santyl around the necrotic area and then Dakin's moistened gauze over top. 02/16/2021 upon evaluation today patient appears to be doing excellent in regard to her wound. She has been tolerating the dressing changes without complication. Fortunately there is no signs of active infection at this time which is great news. I do feel like that the Dakin's moistened gauze has done well as has the Santyl but I feel like it is time to move on to something different at this point based on what I am seeing. Objective Constitutional Well-nourished and well-hydrated in no acute distress. Vitals Time Taken: 8:53 AM, Height: 62 in, Weight: 177 lbs, BMI: 32.4,  Temperature: 97.96 F, Pulse: 88 bpm, Respiratory Rate: 16 breaths/min, Blood Pressure: 133/78 mmHg. Respiratory normal breathing  mellitus with other skin ulcer 01/27/2021 No Yes Inactive Problems Resolved Problems Electronic Signature(s) Signed: 02/16/2021 9:11:45 AM By: Lenda Kelp PA-C Entered By: Lenda Kelp on 02/16/2021 09:11:45 Angel Contes ([id number]) -------------------------------------------------------------------------------- Progress Note Details Patient Name: Angel Contes. Date of Service: 02/16/2021 8:45 AM Medical Record Number: [id number] Patient Account Number: [id number] Date of Birth/Sex: October 04, 1956 (65 y.o. F) Treating RN: Rogers Blocker Primary Care Provider: Simone Curia Other Clinician: Lolita Cram Referring Provider: Simone Curia Treating Provider/Extender: Rowan Blase in Treatment: 2 Subjective Chief Complaint Information obtained from Patient Right breast cellulitis abscess History of Present Illness (HPI) 01/27/2021 upon evaluation today patient appears to be doing somewhat poorly in regard to an abscess over the area just inferior to her right breast. Subsequently she tells me that this started about a month ago initially it was a raised/bumped area that then she put some drawing salve on it and it began to drain. Subsequently she did see her primary care provider and she was placed initially on a 10-day course of doxycycline which then was extended so that she would have this for at least 14 days maybe a little longer I am not sure when the first course was given to her. Nonetheless she has enough to make it through till Monday which is good news. Nonetheless unfortunately she tells me that this is still giving her quite a bit of discomfort is also draining quite  a bit she just put a little bit of antibiotic ointment on it and covering with a dressing if anything. With that being said I think that this probably represents something needs to be cleaned out probably is significantly deeper than what is obvious just on initial inspection. The patient does have a history of diabetes mellitus type 2, hypertension, and otherwise tells me she is never had anything quite like this happen before. 02/03/21 upon evaluation today patient actually is making excellent progress in regard to her wound. Fortunately there does not appear to be any signs of active infection which is great news and overall very pleased in that regard. I do feel like the patient needs to have some Santyl to help clean up the wound edges. I am really somewhat reluctant to perform a lot of debridement here as I am afraid that this can cause more damage to the area that is trying to heal already. Nonetheless I think we can use Santyl along the edges of the wound to try to help clean this up a little better. 02/10/21 upon evaluation today patient appears to be doing decently well in regard to her wound. She has been tolerating the dressing changes without complication. Fortunately there is no signs of active infection which is great news. She is using Santyl around the necrotic area and then Dakin's moistened gauze over top. 02/16/2021 upon evaluation today patient appears to be doing excellent in regard to her wound. She has been tolerating the dressing changes without complication. Fortunately there is no signs of active infection at this time which is great news. I do feel like that the Dakin's moistened gauze has done well as has the Santyl but I feel like it is time to move on to something different at this point based on what I am seeing. Objective Constitutional Well-nourished and well-hydrated in no acute distress. Vitals Time Taken: 8:53 AM, Height: 62 in, Weight: 177 lbs, BMI: 32.4,  Temperature: 97.96 F, Pulse: 88 bpm, Respiratory Rate: 16 breaths/min, Blood Pressure: 133/78 mmHg. Respiratory normal breathing  Angel Reynolds, Angel M. ([id number]) Visit Report for 02/16/2021 Chief Complaint Document Details Patient Name: Angel Reynolds, Angel M. Date of Service: 02/16/2021 8:45 AM Medical Record Number: [id number] Patient Account Number: [id number] Date of Birth/Sex: [birth year] (65 y.o. F) Treating RN: Rogers BlockerSanchez, Kenia Primary Care Provider: Simone CuriaLEE, KEUNG Other Clinician: Lolita CramBurnette, Kyara Referring Provider: Simone CuriaLEE, KEUNG Treating Provider/Extender: Rowan BlaseStone, Druscilla Petsch Weeks in Treatment: 2 Information Obtained from: Patient Chief Complaint Right breast cellulitis abscess Electronic Signature(s) Signed: 02/16/2021 9:11:52 AM By: Lenda KelpStone III, Kymberly Blomberg PA-C Entered By: Lenda KelpStone III, Kelsey Durflinger on 02/16/2021 09:11:52 Angel Reynolds, Angel M. ([id number]) -------------------------------------------------------------------------------- HPI Details Patient Name: Angel Reynolds, Angel M. Date of Service: 02/16/2021 8:45 AM Medical Record Number: [id number] Patient Account Number: [id number] Date of Birth/Sex: [birth year] (65 y.o. F) Treating RN: Rogers BlockerSanchez, Kenia Primary Care Provider: Simone CuriaLEE, KEUNG Other Clinician: Lolita CramBurnette, Kyara Referring Provider: Simone CuriaLEE, KEUNG Treating Provider/Extender: Rowan BlaseStone, Shynice Sigel Weeks in Treatment: 2 History of Present Illness HPI Description: 01/27/2021 upon evaluation today patient appears to be doing somewhat poorly in regard to an abscess over the area just inferior to her right breast. Subsequently she tells me that this started about a month ago initially it was a raised/bumped area that then she put some drawing salve on it and it began to drain. Subsequently she did see her primary care provider and she was placed initially on a 10-day course of doxycycline which then was extended so that she would have this for at least 14 days maybe a little longer I am not sure when the first course was given to her. Nonetheless she has enough to make it through till Monday which is good news. Nonetheless unfortunately she tells me that this is still  giving her quite a bit of discomfort is also draining quite a bit she just put a little bit of antibiotic ointment on it and covering with a dressing if anything. With that being said I think that this probably represents something needs to be cleaned out probably is significantly deeper than what is obvious just on initial inspection. The patient does have a history of diabetes mellitus type 2, hypertension, and otherwise tells me she is never had anything quite like this happen before. 02/03/21 upon evaluation today patient actually is making excellent progress in regard to her wound. Fortunately there does not appear to be any signs of active infection which is great news and overall very pleased in that regard. I do feel like the patient needs to have some Santyl to help clean up the wound edges. I am really somewhat reluctant to perform a lot of debridement here as I am afraid that this can cause more damage to the area that is trying to heal already. Nonetheless I think we can use Santyl along the edges of the wound to try to help clean this up a little better. 02/10/21 upon evaluation today patient appears to be doing decently well in regard to her wound. She has been tolerating the dressing changes without complication. Fortunately there is no signs of active infection which is great news. She is using Santyl around the necrotic area and then Dakin's moistened gauze over top. 02/16/2021 upon evaluation today patient appears to be doing excellent in regard to her wound. She has been tolerating the dressing changes without complication. Fortunately there is no signs of active infection at this time which is great news. I do feel like that the Dakin's moistened gauze has done well as has the Santyl but I feel like it is time to move on to something  Angel Reynolds, Angel M. ([id number]) Visit Report for 02/16/2021 Chief Complaint Document Details Patient Name: Angel Reynolds, Angel M. Date of Service: 02/16/2021 8:45 AM Medical Record Number: [id number] Patient Account Number: [id number] Date of Birth/Sex: [birth year] (65 y.o. F) Treating RN: Rogers BlockerSanchez, Kenia Primary Care Provider: Simone CuriaLEE, KEUNG Other Clinician: Lolita CramBurnette, Kyara Referring Provider: Simone CuriaLEE, KEUNG Treating Provider/Extender: Rowan BlaseStone, Druscilla Petsch Weeks in Treatment: 2 Information Obtained from: Patient Chief Complaint Right breast cellulitis abscess Electronic Signature(s) Signed: 02/16/2021 9:11:52 AM By: Lenda KelpStone III, Kymberly Blomberg PA-C Entered By: Lenda KelpStone III, Kelsey Durflinger on 02/16/2021 09:11:52 Angel Reynolds, Angel M. ([id number]) -------------------------------------------------------------------------------- HPI Details Patient Name: Angel Reynolds, Angel M. Date of Service: 02/16/2021 8:45 AM Medical Record Number: [id number] Patient Account Number: [id number] Date of Birth/Sex: [birth year] (65 y.o. F) Treating RN: Rogers BlockerSanchez, Kenia Primary Care Provider: Simone CuriaLEE, KEUNG Other Clinician: Lolita CramBurnette, Kyara Referring Provider: Simone CuriaLEE, KEUNG Treating Provider/Extender: Rowan BlaseStone, Shynice Sigel Weeks in Treatment: 2 History of Present Illness HPI Description: 01/27/2021 upon evaluation today patient appears to be doing somewhat poorly in regard to an abscess over the area just inferior to her right breast. Subsequently she tells me that this started about a month ago initially it was a raised/bumped area that then she put some drawing salve on it and it began to drain. Subsequently she did see her primary care provider and she was placed initially on a 10-day course of doxycycline which then was extended so that she would have this for at least 14 days maybe a little longer I am not sure when the first course was given to her. Nonetheless she has enough to make it through till Monday which is good news. Nonetheless unfortunately she tells me that this is still  giving her quite a bit of discomfort is also draining quite a bit she just put a little bit of antibiotic ointment on it and covering with a dressing if anything. With that being said I think that this probably represents something needs to be cleaned out probably is significantly deeper than what is obvious just on initial inspection. The patient does have a history of diabetes mellitus type 2, hypertension, and otherwise tells me she is never had anything quite like this happen before. 02/03/21 upon evaluation today patient actually is making excellent progress in regard to her wound. Fortunately there does not appear to be any signs of active infection which is great news and overall very pleased in that regard. I do feel like the patient needs to have some Santyl to help clean up the wound edges. I am really somewhat reluctant to perform a lot of debridement here as I am afraid that this can cause more damage to the area that is trying to heal already. Nonetheless I think we can use Santyl along the edges of the wound to try to help clean this up a little better. 02/10/21 upon evaluation today patient appears to be doing decently well in regard to her wound. She has been tolerating the dressing changes without complication. Fortunately there is no signs of active infection which is great news. She is using Santyl around the necrotic area and then Dakin's moistened gauze over top. 02/16/2021 upon evaluation today patient appears to be doing excellent in regard to her wound. She has been tolerating the dressing changes without complication. Fortunately there is no signs of active infection at this time which is great news. I do feel like that the Dakin's moistened gauze has done well as has the Santyl but I feel like it is time to move on to something

## 2021-02-17 DIAGNOSIS — S21001A Unspecified open wound of right breast, initial encounter: Secondary | ICD-10-CM | POA: Diagnosis not present

## 2021-02-17 NOTE — Progress Notes (Signed)
Theotis Burrow ([id number]) -------------------------------------------------------------------------------- Wound Assessment Details Patient Name: Angel Reynolds, Angel Reynolds. Date of Service: 02/16/2021 8:45 AM Medical Record Number: [id number] Patient Account Number: [id number] Date of Birth/Sex: [birth year] (64 y.o. F) Treating RN: Hansel Feinstein Primary Care Kenyah Luba: Simone Curia Other Clinician: Lolita Cram Referring Locke Barrell: Simone Curia Treating Jameika Kinn/Extender: Rowan Blase in Treatment: 2 Wound Status Wound Number: 1 Primary Etiology: Abscess Wound  Location: Right Breast Wound Status: Open Wounding Event: Gradually Appeared Comorbid History: Hypertension, Type II Diabetes, Osteoarthritis Date Acquired: 12/30/2020 Weeks Of Treatment: 2 Clustered Wound: No Photos Wound Measurements Length: (cm) 1.3 Width: (cm) 3 Depth: (cm) 0.2 Area: (cm) 3.063 Volume: (cm) 0.613 % Reduction in Area: 12.2% % Reduction in Volume: 70.7% Epithelialization: Small (1-33%) Tunneling: No Undermining: No Wound Description Classification: Full Thickness Without Exposed Support Structures Exudate Amount: Medium Exudate Type: Sanguinous Exudate Color: red Foul Odor After Cleansing: No Slough/Fibrino Yes Wound Bed Granulation Amount: Large (67-100%) Exposed Structure Granulation Quality: Red, Pink Fascia Exposed: No Necrotic Amount: Small (1-33%) Fat Layer (Subcutaneous Tissue) Exposed: Yes Necrotic Quality: Adherent Slough Tendon Exposed: No Muscle Exposed: No Joint Exposed: No Bone Exposed: No Treatment Notes Wound #1 (Breast) Wound Laterality: Right Cleanser Normal Saline Discharge Instruction: Wash your hands with soap and water. Remove old dressing, discard into plastic bag and place into trash. Cleanse the wound with Normal Saline prior to applying a clean dressing using gauze sponges, not tissues or cotton balls. Do not scrub or use excessive force. Pat dry using gauze sponges, not tissue or cotton balls. HEBAH, BOGOSIAN ([id number]) Peri-Wound Care Topical Santyl Collagenase Ointment, 30 (gm), tube Discharge Instruction: Apply thin layer on edge Primary Dressing Hydrofera Blue Ready Transfer Foam, 2.5x2.5 (in/in) Discharge Instruction: Apply Hydrofera Blue Ready to wound bed as directed Secondary Dressing Bordered Gauze Sterile-HBD 4x4 (in/in) Discharge Instruction: Cover wound with Bordered Guaze Sterile as directed Secured With Compression Wrap Compression Stockings Add-Ons Electronic Signature(s) Signed: 02/16/2021  5:20:48 PM By: Hansel Feinstein Entered By: Hansel Feinstein on 02/16/2021 08:57:05 [name] ([id number]) -------------------------------------------------------------------------------- Vitals Details Patient Name: [name]. Date of Service: 02/16/2021 8:45 AM Medical Record Number: [id number] Patient Account Number: [id number] Date of Birth/Sex: April 09, 1956 (64 y.o. F) Treating RN: Hansel Feinstein Primary Care Srishti Strnad: Simone Curia Other Clinician: Lolita Cram Referring Chayse Gracey: Simone Curia Treating Chae Shuster/Extender: Rowan Blase in Treatment: 2 Vital Signs Time Taken: 08:53 Temperature (F): 97.96 Height (in): 62 Pulse (bpm): 88 Weight (lbs): 177 Respiratory Rate (breaths/min): 16 Body Mass Index (BMI): 32.4 Blood Pressure (mmHg): 133/78 Reference Range: 80 - 120 mg / dl Electronic Signature(s) Signed: 02/16/2021 5:20:48 PM By: Hansel Feinstein Entered ByHansel Feinstein on 02/16/2021 08:53:32  Theotis Burrow ([id number]) -------------------------------------------------------------------------------- Wound Assessment Details Patient Name: Angel Reynolds, Angel Reynolds. Date of Service: 02/16/2021 8:45 AM Medical Record Number: [id number] Patient Account Number: [id number] Date of Birth/Sex: [birth year] (64 y.o. F) Treating RN: Hansel Feinstein Primary Care Kenyah Luba: Simone Curia Other Clinician: Lolita Cram Referring Locke Barrell: Simone Curia Treating Jameika Kinn/Extender: Rowan Blase in Treatment: 2 Wound Status Wound Number: 1 Primary Etiology: Abscess Wound  Location: Right Breast Wound Status: Open Wounding Event: Gradually Appeared Comorbid History: Hypertension, Type II Diabetes, Osteoarthritis Date Acquired: 12/30/2020 Weeks Of Treatment: 2 Clustered Wound: No Photos Wound Measurements Length: (cm) 1.3 Width: (cm) 3 Depth: (cm) 0.2 Area: (cm) 3.063 Volume: (cm) 0.613 % Reduction in Area: 12.2% % Reduction in Volume: 70.7% Epithelialization: Small (1-33%) Tunneling: No Undermining: No Wound Description Classification: Full Thickness Without Exposed Support Structures Exudate Amount: Medium Exudate Type: Sanguinous Exudate Color: red Foul Odor After Cleansing: No Slough/Fibrino Yes Wound Bed Granulation Amount: Large (67-100%) Exposed Structure Granulation Quality: Red, Pink Fascia Exposed: No Necrotic Amount: Small (1-33%) Fat Layer (Subcutaneous Tissue) Exposed: Yes Necrotic Quality: Adherent Slough Tendon Exposed: No Muscle Exposed: No Joint Exposed: No Bone Exposed: No Treatment Notes Wound #1 (Breast) Wound Laterality: Right Cleanser Normal Saline Discharge Instruction: Wash your hands with soap and water. Remove old dressing, discard into plastic bag and place into trash. Cleanse the wound with Normal Saline prior to applying a clean dressing using gauze sponges, not tissues or cotton balls. Do not scrub or use excessive force. Pat dry using gauze sponges, not tissue or cotton balls. HEBAH, BOGOSIAN ([id number]) Peri-Wound Care Topical Santyl Collagenase Ointment, 30 (gm), tube Discharge Instruction: Apply thin layer on edge Primary Dressing Hydrofera Blue Ready Transfer Foam, 2.5x2.5 (in/in) Discharge Instruction: Apply Hydrofera Blue Ready to wound bed as directed Secondary Dressing Bordered Gauze Sterile-HBD 4x4 (in/in) Discharge Instruction: Cover wound with Bordered Guaze Sterile as directed Secured With Compression Wrap Compression Stockings Add-Ons Electronic Signature(s) Signed: 02/16/2021  5:20:48 PM By: Hansel Feinstein Entered By: Hansel Feinstein on 02/16/2021 08:57:05 [name] ([id number]) -------------------------------------------------------------------------------- Vitals Details Patient Name: [name]. Date of Service: 02/16/2021 8:45 AM Medical Record Number: [id number] Patient Account Number: [id number] Date of Birth/Sex: April 09, 1956 (64 y.o. F) Treating RN: Hansel Feinstein Primary Care Srishti Strnad: Simone Curia Other Clinician: Lolita Cram Referring Chayse Gracey: Simone Curia Treating Chae Shuster/Extender: Rowan Blase in Treatment: 2 Vital Signs Time Taken: 08:53 Temperature (F): 97.96 Height (in): 62 Pulse (bpm): 88 Weight (lbs): 177 Respiratory Rate (breaths/min): 16 Body Mass Index (BMI): 32.4 Blood Pressure (mmHg): 133/78 Reference Range: 80 - 120 mg / dl Electronic Signature(s) Signed: 02/16/2021 5:20:48 PM By: Hansel Feinstein Entered ByHansel Feinstein on 02/16/2021 08:53:32  Theotis Burrow ([id number]) -------------------------------------------------------------------------------- Wound Assessment Details Patient Name: Angel Reynolds, Angel Reynolds. Date of Service: 02/16/2021 8:45 AM Medical Record Number: [id number] Patient Account Number: [id number] Date of Birth/Sex: [birth year] (64 y.o. F) Treating RN: Hansel Feinstein Primary Care Kenyah Luba: Simone Curia Other Clinician: Lolita Cram Referring Locke Barrell: Simone Curia Treating Jameika Kinn/Extender: Rowan Blase in Treatment: 2 Wound Status Wound Number: 1 Primary Etiology: Abscess Wound  Location: Right Breast Wound Status: Open Wounding Event: Gradually Appeared Comorbid History: Hypertension, Type II Diabetes, Osteoarthritis Date Acquired: 12/30/2020 Weeks Of Treatment: 2 Clustered Wound: No Photos Wound Measurements Length: (cm) 1.3 Width: (cm) 3 Depth: (cm) 0.2 Area: (cm) 3.063 Volume: (cm) 0.613 % Reduction in Area: 12.2% % Reduction in Volume: 70.7% Epithelialization: Small (1-33%) Tunneling: No Undermining: No Wound Description Classification: Full Thickness Without Exposed Support Structures Exudate Amount: Medium Exudate Type: Sanguinous Exudate Color: red Foul Odor After Cleansing: No Slough/Fibrino Yes Wound Bed Granulation Amount: Large (67-100%) Exposed Structure Granulation Quality: Red, Pink Fascia Exposed: No Necrotic Amount: Small (1-33%) Fat Layer (Subcutaneous Tissue) Exposed: Yes Necrotic Quality: Adherent Slough Tendon Exposed: No Muscle Exposed: No Joint Exposed: No Bone Exposed: No Treatment Notes Wound #1 (Breast) Wound Laterality: Right Cleanser Normal Saline Discharge Instruction: Wash your hands with soap and water. Remove old dressing, discard into plastic bag and place into trash. Cleanse the wound with Normal Saline prior to applying a clean dressing using gauze sponges, not tissues or cotton balls. Do not scrub or use excessive force. Pat dry using gauze sponges, not tissue or cotton balls. HEBAH, BOGOSIAN ([id number]) Peri-Wound Care Topical Santyl Collagenase Ointment, 30 (gm), tube Discharge Instruction: Apply thin layer on edge Primary Dressing Hydrofera Blue Ready Transfer Foam, 2.5x2.5 (in/in) Discharge Instruction: Apply Hydrofera Blue Ready to wound bed as directed Secondary Dressing Bordered Gauze Sterile-HBD 4x4 (in/in) Discharge Instruction: Cover wound with Bordered Guaze Sterile as directed Secured With Compression Wrap Compression Stockings Add-Ons Electronic Signature(s) Signed: 02/16/2021  5:20:48 PM By: Hansel Feinstein Entered By: Hansel Feinstein on 02/16/2021 08:57:05 [name] ([id number]) -------------------------------------------------------------------------------- Vitals Details Patient Name: [name]. Date of Service: 02/16/2021 8:45 AM Medical Record Number: [id number] Patient Account Number: [id number] Date of Birth/Sex: April 09, 1956 (64 y.o. F) Treating RN: Hansel Feinstein Primary Care Srishti Strnad: Simone Curia Other Clinician: Lolita Cram Referring Chayse Gracey: Simone Curia Treating Chae Shuster/Extender: Rowan Blase in Treatment: 2 Vital Signs Time Taken: 08:53 Temperature (F): 97.96 Height (in): 62 Pulse (bpm): 88 Weight (lbs): 177 Respiratory Rate (breaths/min): 16 Body Mass Index (BMI): 32.4 Blood Pressure (mmHg): 133/78 Reference Range: 80 - 120 mg / dl Electronic Signature(s) Signed: 02/16/2021 5:20:48 PM By: Hansel Feinstein Entered ByHansel Feinstein on 02/16/2021 08:53:32  wounds INTERVENTIONS - Wound Dressings []  - Small Wound Dressing one or multiple wounds 0 X- 1 15 Medium Wound Dressing one or multiple wounds []  - 0 Large Wound Dressing one or multiple wounds []  - 0 Application of Medications - topical []  - 0 Application of Medications - injection INTERVENTIONS - Miscellaneous []  - External ear exam 0 []  - 0 Specimen Collection (cultures, biopsies, blood, body fluids, etc.) []  - 0 Specimen(s) / Culture(s) sent or taken to Lab for analysis []  - 0 Patient Transfer (multiple staff / / Similar devices) []  - 0 Simple Staple / Suture removal (25 or less) []  - 0 Complex Staple / Suture removal (26 or more) []  - 0 Hypo / Hyperglycemic Management (close monitor of Blood Glucose) []  - 0 Ankle / Brachial Index (ABI) - do not check if billed separately X- 1 5 Vital Signs Has the patient been seen at the hospital within the last three years: Yes Total Score: 80 Level Of Care: New/Established - Level 3 Electronic Signature(s) Signed: 02/16/2021 5:28:26 PM By: , RN Entered By: , Kenia on 02/16/2021 09:17:38 ( ) -------------------------------------------------------------------------------- Encounter Discharge Information Details Patient Name: Nurse, adult. Date of Service: 02/16/2021 8:45 AM Medical Record Number: Patient Account Number: Date of Birth/Sex: 1956/10/13 (64 y.o. F) Treating RN: Phillis Haggis Primary Care Mikhael Hendriks: Dondra Prader Other Clinician: Phillis Haggis Referring Percy Comp: 02/18/2021 Treating Caleigh Rabelo/Extender: [name] in Treatment: 2 Encounter Discharge Information Items Discharge Condition: Stable Ambulatory Status: Ambulatory Discharge Destination: Home Transportation: Private  Auto Accompanied By: husband Schedule Follow-up Appointment: Yes Clinical Summary of Care: Electronic Signature(s) Signed: 02/16/2021 5:20:48 PM By: [name] Entered By: 02/18/2021 on 02/16/2021 09:28:31 [id number] (07/31/1956) -------------------------------------------------------------------------------- Lower Extremity Assessment Details Patient Name: 02-09-2002. Date of Service: 02/16/2021 8:45 AM Medical Record Number: Simone Curia Patient Account Number: Lolita Cram Date of Birth/Sex: [birth year] (64 y.o. F) Treating RN: 02/18/2021 Primary Care Cyndra Feinberg: Hansel Feinstein Other Clinician: Hansel Feinstein Referring Estefany Goebel: 02/18/2021 Treating Sabir Charters/Extender: [name] in Treatment: 2 Electronic Signature(s) Signed: 02/16/2021 5:20:48 PM By: [name] Entered By: 02/18/2021 on 02/16/2021 08:58:12 [id number] (07/31/1956) -------------------------------------------------------------------------------- Multi Wound Chart Details Patient Name: 02-09-2002. Date of Service: 02/16/2021 8:45 AM Medical Record Number: Simone Curia Patient Account Number: Lolita Cram Date of Birth/Sex: 1956-10-16 (64 y.o. F) Treating RN: 02/18/2021 Primary Care Cleola Perryman: Hansel Feinstein Other Clinician: Hansel Feinstein Referring Deldrick Linch: 02/18/2021 Treating Paislea Hatton/Extender: [name] in Treatment: 2 Vital Signs Height(in): 62 Pulse(bpm): 88 Weight(lbs): 177 Blood Pressure(mmHg): 133/78 Body Mass Index(BMI): 32 Temperature(F): 97.96 Respiratory Rate(breaths/min): 16 Photos: [N/A:N/A] Wound Location: Right Breast N/A N/A Wounding Event: Gradually Appeared N/A N/A Primary Etiology: Abscess N/A N/A Comorbid History: Hypertension, Type II Diabetes, N/A N/A Osteoarthritis Date Acquired: 12/30/2020 N/A N/A Weeks of Treatment: 2 N/A N/A Wound Status: Open N/A N/A Measurements L x W x D (cm) 1.3x3x0.2 N/A N/A Area (cm) : 3.063 N/A N/A Volume (cm) : 0.613 N/A  N/A % Reduction in Area: 12.20% N/A N/A % Reduction in Volume: 70.70% N/A N/A Classification: Full Thickness Without Exposed N/A N/A Support Structures Exudate Amount: Medium N/A N/A Exudate Type: Sanguinous N/A N/A Exudate Color: red N/A N/A Granulation Amount: Large (67-100%) N/A N/A Granulation Quality: Red, Pink N/A N/A Necrotic Amount: Small (1-33%) N/A N/A Exposed Structures: Fat Layer (Subcutaneous Tissue): N/A N/A Yes Fascia: No Tendon: No Muscle: No Joint: No Bone: No Epithelialization: Small (1-33%) N/A

## 2021-02-22 DIAGNOSIS — Z87891 Personal history of nicotine dependence: Secondary | ICD-10-CM | POA: Diagnosis not present

## 2021-02-22 DIAGNOSIS — F419 Anxiety disorder, unspecified: Secondary | ICD-10-CM | POA: Diagnosis not present

## 2021-02-22 DIAGNOSIS — M545 Low back pain, unspecified: Secondary | ICD-10-CM | POA: Diagnosis not present

## 2021-02-22 DIAGNOSIS — I10 Essential (primary) hypertension: Secondary | ICD-10-CM | POA: Diagnosis not present

## 2021-02-22 DIAGNOSIS — M25511 Pain in right shoulder: Secondary | ICD-10-CM | POA: Diagnosis not present

## 2021-02-22 DIAGNOSIS — F3341 Major depressive disorder, recurrent, in partial remission: Secondary | ICD-10-CM | POA: Diagnosis not present

## 2021-02-22 DIAGNOSIS — G629 Polyneuropathy, unspecified: Secondary | ICD-10-CM | POA: Diagnosis not present

## 2021-02-22 DIAGNOSIS — R748 Abnormal levels of other serum enzymes: Secondary | ICD-10-CM | POA: Diagnosis not present

## 2021-02-22 DIAGNOSIS — K5909 Other constipation: Secondary | ICD-10-CM | POA: Diagnosis not present

## 2021-02-23 ENCOUNTER — Other Ambulatory Visit: Payer: Self-pay

## 2021-02-23 ENCOUNTER — Encounter: Payer: Medicare HMO | Admitting: Physician Assistant

## 2021-02-23 DIAGNOSIS — E11622 Type 2 diabetes mellitus with other skin ulcer: Secondary | ICD-10-CM | POA: Diagnosis not present

## 2021-02-23 DIAGNOSIS — I10 Essential (primary) hypertension: Secondary | ICD-10-CM | POA: Diagnosis not present

## 2021-02-23 DIAGNOSIS — L98492 Non-pressure chronic ulcer of skin of other sites with fat layer exposed: Secondary | ICD-10-CM | POA: Diagnosis not present

## 2021-02-23 DIAGNOSIS — E1151 Type 2 diabetes mellitus with diabetic peripheral angiopathy without gangrene: Secondary | ICD-10-CM | POA: Diagnosis not present

## 2021-02-23 DIAGNOSIS — N611 Abscess of the breast and nipple: Secondary | ICD-10-CM | POA: Diagnosis not present

## 2021-02-23 NOTE — Progress Notes (Signed)
0 Complex Wound Measurement - multiple wounds INTERVENTIONS - Wound Dressings '[]'  - Small Wound Dressing one or multiple wounds 0 X- 1 15 Medium Wound Dressing one or multiple wounds '[]'  - 0 Large Wound Dressing one or multiple wounds '[]'  - 0 Application of Medications - topical '[]'  - 0 Application of Medications - injection INTERVENTIONS - Miscellaneous '[]'  - External ear exam 0 '[]'  - 0 Specimen Collection (cultures, biopsies, blood, body fluids, etc.) '[]'  - 0 Specimen(s) / Culture(s) sent or taken to Lab for analysis '[]'  - 0 Patient Transfer (multiple staff / Civil Service fast streamer / Similar devices) '[]'  - 0 Simple Staple / Suture removal (25 or less) '[]'  - 0 Complex Staple / Suture removal (26 or more) '[]'  - 0 Hypo / Hyperglycemic Management (close monitor of Blood Glucose) '[]'  - 0 Ankle / Brachial Index (ABI) - do not check if billed separately X- 1 5 Vital Signs Has the patient been seen at the hospital within the last three years: Yes Total Score: 80 Level Of Care: New/Established - Level 3 Electronic Signature(s) Signed: 02/23/2021 5:15:58 PM By: Georges Mouse, Minus Breeding RN Entered By: Georges Mouse, Kenia on 02/23/2021 10:44:30 Angel Reynolds ([id number]) -------------------------------------------------------------------------------- Encounter Discharge Information Details Patient Name: Angel Reynolds. Date of Service: 02/23/2021 10:00 AM Medical Record Number: [id number] Patient Account Number: [id number] Date of Birth/Sex: 1956/05/16 (64 y.o. F) Treating RN: Dolan Amen Primary Care Yazmina Pareja: Cher Nakai Other Clinician: Jeanine Luz Referring Shaunna Rosetti: Cher Nakai Treating Adraine Biffle/Extender: Skipper Cliche in Treatment: 3 Encounter Discharge Information Items Discharge Condition: Stable Ambulatory Status: Ambulatory Discharge  Destination: Home Transportation: Private Auto Accompanied By: husband Schedule Follow-up Appointment: Yes Clinical Summary of Care: Electronic Signature(s) Signed: 02/23/2021 4:23:12 PM By: Jeanine Luz Entered By: Jeanine Luz on 02/23/2021 10:52:40 Angel Reynolds ([id number]) -------------------------------------------------------------------------------- Lower Extremity Assessment Details Patient Name: Angel Reynolds. Date of Service: 02/23/2021 10:00 AM Medical Record Number: [id number] Patient Account Number: [id number] Date of Birth/Sex: 1956/10/08 (64 y.o. F) Treating RN: Donnamarie Poag Primary Care Shanard Treto: Cher Nakai Other Clinician: Jeanine Luz Referring Blaize Epple: Cher Nakai Treating Doye Montilla/Extender: Skipper Cliche in Treatment: 3 Electronic Signature(s) Signed: 02/23/2021 2:33:38 PM By: Donnamarie Poag Entered By: Donnamarie Poag on 02/23/2021 10:11:55 Angel Reynolds ([id number]) -------------------------------------------------------------------------------- Multi Wound Chart Details Patient Name: Angel Reynolds. Date of Service: 02/23/2021 10:00 AM Medical Record Number: [id number] Patient Account Number: [id number] Date of Birth/Sex: June 30, 1956 (64 y.o. F) Treating RN: Dolan Amen Primary Care Kyelle Urbas: Cher Nakai Other Clinician: Jeanine Luz Referring Riverlyn Kizziah: Cher Nakai Treating Ayvah Caroll/Extender: Skipper Cliche in Treatment: 3 Vital Signs Height(in): 62 Pulse(bpm): 38 Weight(lbs): 177 Blood Pressure(mmHg): 152/84 Body Mass Index(BMI): 32 Temperature(F): 97.8 Respiratory Rate(breaths/min): 18 Photos: [N/A:N/A] Wound Location: Right Breast N/A N/A Wounding Event: Gradually Appeared N/A N/A Primary Etiology: Abscess N/A N/A Comorbid History: Hypertension, Type II Diabetes, N/A N/A Osteoarthritis Date Acquired: 12/30/2020 N/A N/A Weeks of Treatment: 3 N/A N/A Wound Status: Open N/A N/A Measurements L x W x D (cm) 1.3x2.9x0.1 N/A  N/A Area (cm) : 2.961 N/A N/A Volume (cm) : 0.296 N/A N/A % Reduction in Area: 15.10% N/A N/A % Reduction in Volume: 85.90% N/A N/A Classification: Full Thickness Without Exposed N/A N/A Support Structures Exudate Amount: Medium N/A N/A Exudate Type: Serosanguineous N/A N/A Exudate Color: red, brown N/A N/A Granulation Amount: Large (67-100%) N/A N/A Granulation Quality: Red, Pink N/A N/A Necrotic Amount: Small (1-33%) N/A N/A Exposed Structures: Fat Layer (Subcutaneous Tissue): N/A N/A Yes Fascia: No Tendon: No Muscle: No Joint:  By: Georges Mouse, Minus Breeding RN Entered By: Georges Mouse, Kenia on 02/23/2021 10:44:48 Angel Reynolds ([id number]) -------------------------------------------------------------------------------- Wound Assessment Details Patient Name: Angel Reynolds. Date of Service: 02/23/2021 10:00 AM Medical Record Number: [id number] Patient Account Number: [id number] Date of Birth/Sex: [birth year] (64 y.o. F) Treating RN: Donnamarie Poag Primary Care Quasim Doyon: Cher Nakai Other Clinician: Jeanine Luz Referring Akasia Ahmad: Cher Nakai Treating  Zlata Alcaide/Extender: Skipper Cliche in Treatment: 3 Wound Status Wound Number: 1 Primary Etiology: Abscess Wound Location: Right Breast Wound Status: Open Wounding Event: Gradually Appeared Comorbid History: Hypertension, Type II Diabetes, Osteoarthritis Date Acquired: 12/30/2020 Weeks Of Treatment: 3 Clustered Wound: No Photos Wound Measurements Length: (cm) 1.3 Width: (cm) 2.9 Depth: (cm) 0.1 Area: (cm) 2.961 Volume: (cm) 0.296 % Reduction in Area: 15.1% % Reduction in Volume: 85.9% Epithelialization: Small (1-33%) Tunneling: No Undermining: No Wound Description Classification: Full Thickness Without Exposed Support Structures Exudate Amount: Medium Exudate Type: Serosanguineous Exudate Color: red, brown Foul Odor After Cleansing: No Slough/Fibrino Yes Wound Bed Granulation Amount: Large (67-100%) Exposed Structure Granulation Quality: Red, Pink Fascia Exposed: No Necrotic Amount: Small (1-33%) Fat Layer (Subcutaneous Tissue) Exposed: Yes Tendon Exposed: No Muscle Exposed: No Joint Exposed: No Bone Exposed: No Treatment Notes Wound #1 (Breast) Wound Laterality: Right Cleanser Normal Saline Discharge Instruction: Wash your hands with soap and water. Remove old dressing, discard into plastic bag and place into trash. Cleanse the wound with Normal Saline prior to applying a clean dressing using gauze sponges, not tissues or cotton balls. Do not scrub or use excessive force. Pat dry using gauze sponges, not tissue or cotton balls. UNDRA, TREMBATH ([id number]) Peri-Wound Care Topical Primary Dressing Hydrofera Blue Ready Transfer Foam, 2.5x2.5 (in/in) Discharge Instruction: Apply Hydrofera Blue Ready to wound bed as directed Secondary Dressing Bordered Gauze Sterile-HBD 4x4 (in/in) Discharge Instruction: Cover wound with Bordered Guaze Sterile as directed Secured With Compression Wrap Compression Stockings Add-Ons Electronic Signature(s) Signed: 02/23/2021  2:33:38 PM By: Donnamarie Poag Entered By: Donnamarie Poag on 02/23/2021 10:12:24 Angel Reynolds ([id number]) -------------------------------------------------------------------------------- Vitals Details Patient Name: Angel Reynolds. Date of Service: 02/23/2021 10:00 AM Medical Record Number: [id number] Patient Account Number: [id number] Date of Birth/Sex: 1956/05/04 (64 y.o. F) Treating RN: Donnamarie Poag Primary Care Sullivan Blasing: Cher Nakai Other Clinician: Jeanine Luz Referring Wilmarie Sparlin: Cher Nakai Treating Chyane Greer/Extender: Skipper Cliche in Treatment: 3 Vital Signs Time Taken: 10:04 Temperature (F): 97.8 Height (in): 62 Pulse (bpm): 89 Weight (lbs): 177 Respiratory Rate (breaths/min): 18 Body Mass Index (BMI): 32.4 Blood Pressure (mmHg): 152/84 Reference Range: 80 - 120 mg / dl Electronic Signature(s) Signed: 02/23/2021 2:33:38 PM By: Donnamarie Poag Entered ByDonnamarie Poag on 02/23/2021 10:07:07  0 Complex Wound Measurement - multiple wounds INTERVENTIONS - Wound Dressings '[]'  - Small Wound Dressing one or multiple wounds 0 X- 1 15 Medium Wound Dressing one or multiple wounds '[]'  - 0 Large Wound Dressing one or multiple wounds '[]'  - 0 Application of Medications - topical '[]'  - 0 Application of Medications - injection INTERVENTIONS - Miscellaneous '[]'  - External ear exam 0 '[]'  - 0 Specimen Collection (cultures, biopsies, blood, body fluids, etc.) '[]'  - 0 Specimen(s) / Culture(s) sent or taken to Lab for analysis '[]'  - 0 Patient Transfer (multiple staff / Civil Service fast streamer / Similar devices) '[]'  - 0 Simple Staple / Suture removal (25 or less) '[]'  - 0 Complex Staple / Suture removal (26 or more) '[]'  - 0 Hypo / Hyperglycemic Management (close monitor of Blood Glucose) '[]'  - 0 Ankle / Brachial Index (ABI) - do not check if billed separately X- 1 5 Vital Signs Has the patient been seen at the hospital within the last three years: Yes Total Score: 80 Level Of Care: New/Established - Level 3 Electronic Signature(s) Signed: 02/23/2021 5:15:58 PM By: Georges Mouse, Minus Breeding RN Entered By: Georges Mouse, Kenia on 02/23/2021 10:44:30 Angel Reynolds ([id number]) -------------------------------------------------------------------------------- Encounter Discharge Information Details Patient Name: Angel Reynolds. Date of Service: 02/23/2021 10:00 AM Medical Record Number: [id number] Patient Account Number: [id number] Date of Birth/Sex: 1956/05/16 (64 y.o. F) Treating RN: Dolan Amen Primary Care Yazmina Pareja: Cher Nakai Other Clinician: Jeanine Luz Referring Shaunna Rosetti: Cher Nakai Treating Adraine Biffle/Extender: Skipper Cliche in Treatment: 3 Encounter Discharge Information Items Discharge Condition: Stable Ambulatory Status: Ambulatory Discharge  Destination: Home Transportation: Private Auto Accompanied By: husband Schedule Follow-up Appointment: Yes Clinical Summary of Care: Electronic Signature(s) Signed: 02/23/2021 4:23:12 PM By: Jeanine Luz Entered By: Jeanine Luz on 02/23/2021 10:52:40 Angel Reynolds ([id number]) -------------------------------------------------------------------------------- Lower Extremity Assessment Details Patient Name: Angel Reynolds. Date of Service: 02/23/2021 10:00 AM Medical Record Number: [id number] Patient Account Number: [id number] Date of Birth/Sex: 1956/10/08 (64 y.o. F) Treating RN: Donnamarie Poag Primary Care Shanard Treto: Cher Nakai Other Clinician: Jeanine Luz Referring Blaize Epple: Cher Nakai Treating Doye Montilla/Extender: Skipper Cliche in Treatment: 3 Electronic Signature(s) Signed: 02/23/2021 2:33:38 PM By: Donnamarie Poag Entered By: Donnamarie Poag on 02/23/2021 10:11:55 Angel Reynolds ([id number]) -------------------------------------------------------------------------------- Multi Wound Chart Details Patient Name: Angel Reynolds. Date of Service: 02/23/2021 10:00 AM Medical Record Number: [id number] Patient Account Number: [id number] Date of Birth/Sex: June 30, 1956 (64 y.o. F) Treating RN: Dolan Amen Primary Care Kyelle Urbas: Cher Nakai Other Clinician: Jeanine Luz Referring Riverlyn Kizziah: Cher Nakai Treating Ayvah Caroll/Extender: Skipper Cliche in Treatment: 3 Vital Signs Height(in): 62 Pulse(bpm): 38 Weight(lbs): 177 Blood Pressure(mmHg): 152/84 Body Mass Index(BMI): 32 Temperature(F): 97.8 Respiratory Rate(breaths/min): 18 Photos: [N/A:N/A] Wound Location: Right Breast N/A N/A Wounding Event: Gradually Appeared N/A N/A Primary Etiology: Abscess N/A N/A Comorbid History: Hypertension, Type II Diabetes, N/A N/A Osteoarthritis Date Acquired: 12/30/2020 N/A N/A Weeks of Treatment: 3 N/A N/A Wound Status: Open N/A N/A Measurements L x W x D (cm) 1.3x2.9x0.1 N/A  N/A Area (cm) : 2.961 N/A N/A Volume (cm) : 0.296 N/A N/A % Reduction in Area: 15.10% N/A N/A % Reduction in Volume: 85.90% N/A N/A Classification: Full Thickness Without Exposed N/A N/A Support Structures Exudate Amount: Medium N/A N/A Exudate Type: Serosanguineous N/A N/A Exudate Color: red, brown N/A N/A Granulation Amount: Large (67-100%) N/A N/A Granulation Quality: Red, Pink N/A N/A Necrotic Amount: Small (1-33%) N/A N/A Exposed Structures: Fat Layer (Subcutaneous Tissue): N/A N/A Yes Fascia: No Tendon: No Muscle: No Joint:  ARALYNN, BRAKE ([id number]) Visit Report for 02/23/2021 Arrival Information Details Patient Name: Angel Reynolds, Angel Reynolds. Date of Service: 02/23/2021 10:00 AM Medical Record Number: [id number] Patient Account Number: [id number] Date of Birth/Sex: Jul 20, 1956 (64 y.o. F) Treating RN: Donnamarie Poag Primary Care Dorise Gangi: Cher Nakai Other Clinician: Jeanine Luz Referring Shemuel Harkleroad: Cher Nakai Treating Ritvik Mczeal/Extender: Skipper Cliche in Treatment: 3 Visit Information History Since Last Visit Added or deleted any medications: No Patient Arrived: Ambulatory Had a fall or experienced change in No Arrival Time: 10:05 activities of daily living that may affect Accompanied By: husband risk of falls: Transfer Assistance: None Hospitalized since last visit: No Patient Identification Verified: Yes Has Dressing in Place as Prescribed: Yes Secondary Verification Process Completed: Yes Pain Present Now: No Patient Has Alerts: Yes Patient Alerts: Type II Diabetic Electronic Signature(s) Signed: 02/23/2021 2:33:38 PM By: Donnamarie Poag Entered By: Donnamarie Poag on 02/23/2021 10:06:43 Angel Reynolds ([id number]) -------------------------------------------------------------------------------- Clinic Level of Care Assessment Details Patient Name: Angel Reynolds. Date of Service: 02/23/2021 10:00 AM Medical Record Number: [id number] Patient Account Number: [id number] Date of Birth/Sex: 1956/09/28 (64 y.o. F) Treating RN: Dolan Amen Primary Care Mio Schellinger: Cher Nakai Other Clinician: Jeanine Luz Referring Luverna Degenhart: Cher Nakai Treating Jessye Imhoff/Extender: Skipper Cliche in Treatment: 3 Clinic Level of Care Assessment Items TOOL 4 Quantity Score X - Use when only an EandM is performed on FOLLOW-UP visit 1 0 ASSESSMENTS - Nursing Assessment / Reassessment X - Reassessment of Co-morbidities (includes updates in patient status) 1 10 X- 1 5 Reassessment of Adherence to Treatment  Plan ASSESSMENTS - Wound and Skin Assessment / Reassessment X - Simple Wound Assessment / Reassessment - one wound 1 5 '[]'  - 0 Complex Wound Assessment / Reassessment - multiple wounds '[]'  - 0 Dermatologic / Skin Assessment (not related to wound area) ASSESSMENTS - Focused Assessment '[]'  - Circumferential Edema Measurements - multi extremities 0 '[]'  - 0 Nutritional Assessment / Counseling / Intervention '[]'  - 0 Lower Extremity Assessment (monofilament, tuning fork, pulses) '[]'  - 0 Peripheral Arterial Disease Assessment (using hand held doppler) ASSESSMENTS - Ostomy and/or Continence Assessment and Care '[]'  - Incontinence Assessment and Management 0 '[]'  - 0 Ostomy Care Assessment and Management (repouching, etc.) PROCESS - Coordination of Care X - Simple Patient / Family Education for ongoing care 1 15 '[]'  - 0 Complex (extensive) Patient / Family Education for ongoing care '[]'  - 0 Staff obtains Programmer, systems, Records, Test Results / Process Orders '[]'  - 0 Staff telephones HHA, Nursing Homes / Clarify orders / etc '[]'  - 0 Routine Transfer to another Facility (non-emergent condition) '[]'  - 0 Routine Hospital Admission (non-emergent condition) '[]'  - 0 New Admissions / Biomedical engineer / Ordering NPWT, Apligraf, etc. '[]'  - 0 Emergency Hospital Admission (emergent condition) X- 1 10 Simple Discharge Coordination '[]'  - 0 Complex (extensive) Discharge Coordination PROCESS - Special Needs '[]'  - Pediatric / Minor Patient Management 0 '[]'  - 0 Isolation Patient Management '[]'  - 0 Hearing / Language / Visual special needs '[]'  - 0 Assessment of Community assistance (transportation, D/C planning, etc.) '[]'  - 0 Additional assistance / Altered mentation '[]'  - 0 Support Surface(s) Assessment (bed, cushion, seat, etc.) INTERVENTIONS - Wound Cleansing / Measurement CORRISA, GIBBY. ([id number]) X- 1 5 Simple Wound Cleansing - one wound '[]'  - 0 Complex Wound Cleansing - multiple wounds X- 1 5 Wound  Imaging (photographs - any number of wounds) '[]'  - 0 Wound Tracing (instead of photographs) X- 1 5 Simple Wound Measurement - one wound '[]'  -

## 2021-02-23 NOTE — Progress Notes (Addendum)
Janyth ContesFLINCHUM, Serine M. ([id number]) Visit Report for 02/23/2021 Chief Complaint Document Details Patient Name: Janyth ContesFLINCHUM, Emely M. Date of Service: 02/23/2021 10:00 AM Medical Record Number: [id number] Patient Account Number: [id number] Date of Birth/Sex: Jan 07, 1956 (64 y.o. F) Treating RN: Rogers BlockerSanchez, Kenia Primary Care Provider: Simone CuriaLEE, KEUNG Other Clinician: Lolita CramBurnette, Kyara Referring Provider: Simone CuriaLEE, KEUNG Treating Provider/Extender: Rowan BlaseStone, Undrea Archbold Weeks in Treatment: 3 Information Obtained from: Patient Chief Complaint Right breast cellulitis abscess Electronic Signature(s) Signed: 02/23/2021 10:21:59 AM By: Lenda KelpStone III, Kristin Lamagna PA-C Entered By: Lenda KelpStone III, Tequia Wolman on 02/23/2021 10:21:58 Janyth ContesFLINCHUM, Shante M. ([id number]) -------------------------------------------------------------------------------- HPI Details Patient Name: Janyth ContesFLINCHUM, Darianne M. Date of Service: 02/23/2021 10:00 AM Medical Record Number: [id number] Patient Account Number: [id number] Date of Birth/Sex: Jan 07, 1956 (64 y.o. F) Treating RN: Rogers BlockerSanchez, Kenia Primary Care Provider: Simone CuriaLEE, KEUNG Other Clinician: Lolita CramBurnette, Kyara Referring Provider: Simone CuriaLEE, KEUNG Treating Provider/Extender: Rowan BlaseStone, Justyce Yeater Weeks in Treatment: 3 History of Present Illness HPI Description: 01/27/2021 upon evaluation today patient appears to be doing somewhat poorly in regard to an abscess over the area just inferior to her right breast. Subsequently she tells me that this started about a month ago initially it was a raised/bumped area that then she put some drawing salve on it and it began to drain. Subsequently she did see her primary care provider and she was placed initially on a 10-day course of doxycycline which then was extended so that she would have this for at least 14 days maybe a little longer I am not sure when the first course was given to her. Nonetheless she has enough to make it through till Monday which is good news. Nonetheless unfortunately she tells me that this is  still giving her quite a bit of discomfort is also draining quite a bit she just put a little bit of antibiotic ointment on it and covering with a dressing if anything. With that being said I think that this probably represents something needs to be cleaned out probably is significantly deeper than what is obvious just on initial inspection. The patient does have a history of diabetes mellitus type 2, hypertension, and otherwise tells me she is never had anything quite like this happen before. 02/03/21 upon evaluation today patient actually is making excellent progress in regard to her wound. Fortunately there does not appear to be any signs of active infection which is great news and overall very pleased in that regard. I do feel like the patient needs to have some Santyl to help clean up the wound edges. I am really somewhat reluctant to perform a lot of debridement here as I am afraid that this can cause more damage to the area that is trying to heal already. Nonetheless I think we can use Santyl along the edges of the wound to try to help clean this up a little better. 02/10/21 upon evaluation today patient appears to be doing decently well in regard to her wound. She has been tolerating the dressing changes without complication. Fortunately there is no signs of active infection which is great news. She is using Santyl around the necrotic area and then Dakin's moistened gauze over top. 02/16/2021 upon evaluation today patient appears to be doing excellent in regard to her wound. She has been tolerating the dressing changes without complication. Fortunately there is no signs of active infection at this time which is great news. I do feel like that the Dakin's moistened gauze has done well as has the Santyl but I feel like it is time to move on to something  Janyth ContesFLINCHUM, Serine M. ([id number]) Visit Report for 02/23/2021 Chief Complaint Document Details Patient Name: Janyth ContesFLINCHUM, Emely M. Date of Service: 02/23/2021 10:00 AM Medical Record Number: [id number] Patient Account Number: [id number] Date of Birth/Sex: Jan 07, 1956 (64 y.o. F) Treating RN: Rogers BlockerSanchez, Kenia Primary Care Provider: Simone CuriaLEE, KEUNG Other Clinician: Lolita CramBurnette, Kyara Referring Provider: Simone CuriaLEE, KEUNG Treating Provider/Extender: Rowan BlaseStone, Undrea Archbold Weeks in Treatment: 3 Information Obtained from: Patient Chief Complaint Right breast cellulitis abscess Electronic Signature(s) Signed: 02/23/2021 10:21:59 AM By: Lenda KelpStone III, Kristin Lamagna PA-C Entered By: Lenda KelpStone III, Tequia Wolman on 02/23/2021 10:21:58 Janyth ContesFLINCHUM, Shante M. ([id number]) -------------------------------------------------------------------------------- HPI Details Patient Name: Janyth ContesFLINCHUM, Darianne M. Date of Service: 02/23/2021 10:00 AM Medical Record Number: [id number] Patient Account Number: [id number] Date of Birth/Sex: Jan 07, 1956 (64 y.o. F) Treating RN: Rogers BlockerSanchez, Kenia Primary Care Provider: Simone CuriaLEE, KEUNG Other Clinician: Lolita CramBurnette, Kyara Referring Provider: Simone CuriaLEE, KEUNG Treating Provider/Extender: Rowan BlaseStone, Justyce Yeater Weeks in Treatment: 3 History of Present Illness HPI Description: 01/27/2021 upon evaluation today patient appears to be doing somewhat poorly in regard to an abscess over the area just inferior to her right breast. Subsequently she tells me that this started about a month ago initially it was a raised/bumped area that then she put some drawing salve on it and it began to drain. Subsequently she did see her primary care provider and she was placed initially on a 10-day course of doxycycline which then was extended so that she would have this for at least 14 days maybe a little longer I am not sure when the first course was given to her. Nonetheless she has enough to make it through till Monday which is good news. Nonetheless unfortunately she tells me that this is  still giving her quite a bit of discomfort is also draining quite a bit she just put a little bit of antibiotic ointment on it and covering with a dressing if anything. With that being said I think that this probably represents something needs to be cleaned out probably is significantly deeper than what is obvious just on initial inspection. The patient does have a history of diabetes mellitus type 2, hypertension, and otherwise tells me she is never had anything quite like this happen before. 02/03/21 upon evaluation today patient actually is making excellent progress in regard to her wound. Fortunately there does not appear to be any signs of active infection which is great news and overall very pleased in that regard. I do feel like the patient needs to have some Santyl to help clean up the wound edges. I am really somewhat reluctant to perform a lot of debridement here as I am afraid that this can cause more damage to the area that is trying to heal already. Nonetheless I think we can use Santyl along the edges of the wound to try to help clean this up a little better. 02/10/21 upon evaluation today patient appears to be doing decently well in regard to her wound. She has been tolerating the dressing changes without complication. Fortunately there is no signs of active infection which is great news. She is using Santyl around the necrotic area and then Dakin's moistened gauze over top. 02/16/2021 upon evaluation today patient appears to be doing excellent in regard to her wound. She has been tolerating the dressing changes without complication. Fortunately there is no signs of active infection at this time which is great news. I do feel like that the Dakin's moistened gauze has done well as has the Santyl but I feel like it is time to move on to something  Janyth ContesFLINCHUM, Serine M. ([id number]) Visit Report for 02/23/2021 Chief Complaint Document Details Patient Name: Janyth ContesFLINCHUM, Emely M. Date of Service: 02/23/2021 10:00 AM Medical Record Number: [id number] Patient Account Number: [id number] Date of Birth/Sex: Jan 07, 1956 (64 y.o. F) Treating RN: Rogers BlockerSanchez, Kenia Primary Care Provider: Simone CuriaLEE, KEUNG Other Clinician: Lolita CramBurnette, Kyara Referring Provider: Simone CuriaLEE, KEUNG Treating Provider/Extender: Rowan BlaseStone, Undrea Archbold Weeks in Treatment: 3 Information Obtained from: Patient Chief Complaint Right breast cellulitis abscess Electronic Signature(s) Signed: 02/23/2021 10:21:59 AM By: Lenda KelpStone III, Kristin Lamagna PA-C Entered By: Lenda KelpStone III, Tequia Wolman on 02/23/2021 10:21:58 Janyth ContesFLINCHUM, Shante M. ([id number]) -------------------------------------------------------------------------------- HPI Details Patient Name: Janyth ContesFLINCHUM, Darianne M. Date of Service: 02/23/2021 10:00 AM Medical Record Number: [id number] Patient Account Number: [id number] Date of Birth/Sex: Jan 07, 1956 (64 y.o. F) Treating RN: Rogers BlockerSanchez, Kenia Primary Care Provider: Simone CuriaLEE, KEUNG Other Clinician: Lolita CramBurnette, Kyara Referring Provider: Simone CuriaLEE, KEUNG Treating Provider/Extender: Rowan BlaseStone, Justyce Yeater Weeks in Treatment: 3 History of Present Illness HPI Description: 01/27/2021 upon evaluation today patient appears to be doing somewhat poorly in regard to an abscess over the area just inferior to her right breast. Subsequently she tells me that this started about a month ago initially it was a raised/bumped area that then she put some drawing salve on it and it began to drain. Subsequently she did see her primary care provider and she was placed initially on a 10-day course of doxycycline which then was extended so that she would have this for at least 14 days maybe a little longer I am not sure when the first course was given to her. Nonetheless she has enough to make it through till Monday which is good news. Nonetheless unfortunately she tells me that this is  still giving her quite a bit of discomfort is also draining quite a bit she just put a little bit of antibiotic ointment on it and covering with a dressing if anything. With that being said I think that this probably represents something needs to be cleaned out probably is significantly deeper than what is obvious just on initial inspection. The patient does have a history of diabetes mellitus type 2, hypertension, and otherwise tells me she is never had anything quite like this happen before. 02/03/21 upon evaluation today patient actually is making excellent progress in regard to her wound. Fortunately there does not appear to be any signs of active infection which is great news and overall very pleased in that regard. I do feel like the patient needs to have some Santyl to help clean up the wound edges. I am really somewhat reluctant to perform a lot of debridement here as I am afraid that this can cause more damage to the area that is trying to heal already. Nonetheless I think we can use Santyl along the edges of the wound to try to help clean this up a little better. 02/10/21 upon evaluation today patient appears to be doing decently well in regard to her wound. She has been tolerating the dressing changes without complication. Fortunately there is no signs of active infection which is great news. She is using Santyl around the necrotic area and then Dakin's moistened gauze over top. 02/16/2021 upon evaluation today patient appears to be doing excellent in regard to her wound. She has been tolerating the dressing changes without complication. Fortunately there is no signs of active infection at this time which is great news. I do feel like that the Dakin's moistened gauze has done well as has the Santyl but I feel like it is time to move on to something  different at this point based on what I am seeing. 02/23/21 upon evaluation today patient is making excellent progress in regard to her wound. I do  not see any evidence of infection and infected opening think she did not require the Santyl any longer. This is great news. No fevers, chills, nausea, vomiting, or diarrhea. Electronic Signature(s) Signed: 02/23/2021 5:38:52 PM By: Lenda Kelp PA-C Entered By: Lenda Kelp on 02/23/2021 17:38:52 [name] ([id number]) -------------------------------------------------------------------------------- Physical Exam Details Patient Name: [name] Date of Service: 02/23/2021 10:00 AM Medical Record Number: [id number] Patient Account Number: [id number] Date of Birth/Sex: Jun 30, 1956 (64 y.o. F) Treating RN: Rogers Blocker Primary Care Provider: Simone Curia Other Clinician: Lolita Cram Referring Provider: Simone Curia Treating Provider/Extender: Rowan Blase in Treatment: 3 Constitutional Well-nourished and well-hydrated in no acute distress. Respiratory normal breathing without difficulty. Psychiatric this patient is able to make decisions and demonstrates good insight into disease process. Alert and Oriented x 3. pleasant and cooperative. Notes Patient's wound bed showed signs of good granulation epithelization. There does not appear to be any significant slough buildup which is excellent news. Overall I think that she is doing quite well. Electronic Signature(s) Signed: 02/23/2021 5:39:11 PM By: Lenda Kelp PA-C Entered By: Lenda Kelp on 02/23/2021 17:39:10 [name] ([id number]) -------------------------------------------------------------------------------- Physician Orders Details Patient Name: [name]. Date of Service: 02/23/2021 10:00 AM Medical Record Number: [id number] Patient Account Number: [id number] Date of Birth/Sex: 14-Oct-1956 (64 y.o. F) Treating RN: Rogers Blocker Primary Care Provider: Simone Curia Other Clinician: Lolita Cram Referring Provider: Simone Curia Treating Provider/Extender: Rowan Blase in Treatment:  3 Verbal / Phone Orders: No Diagnosis Coding ICD-10 Coding Code Description L02.818 Cutaneous abscess of other sites L98.492 Non-pressure chronic ulcer of skin of other sites with fat layer exposed I10 Essential (primary) hypertension E11.622 Type 2 diabetes mellitus with other skin ulcer Follow-up Appointments o Return Appointment in 2 weeks. Wound Treatment Wound #1 - Breast Wound Laterality: Right Cleanser: Normal Saline (Generic) 3 x Per Week/30 Days Discharge Instructions: Wash your hands with soap and water. Remove old dressing, discard into plastic bag and place into trash. Cleanse the wound with Normal Saline prior to applying a clean dressing using gauze sponges, not tissues or cotton balls. Do not scrub or use excessive force. Pat dry using gauze sponges, not tissue or cotton balls. Primary Dressing: Hydrofera Blue Ready Transfer Foam, 2.5x2.5 (in/in) (Generic) 3 x Per Week/30 Days Discharge Instructions: Apply Hydrofera Blue Ready to wound bed as directed Secondary Dressing: Bordered Gauze Sterile-HBD 4x4 (in/in) (Generic) 3 x Per Week/30 Days Discharge Instructions: Cover wound with Bordered Guaze Sterile as directed Electronic Signature(s) Signed: 02/23/2021 5:15:58 PM By: Phillis Haggis, Dondra Prader RN Signed: 02/24/2021 1:09:49 PM By: Lenda Kelp PA-C Entered By: Phillis Haggis, Dondra Prader on 02/23/2021 10:44:06 [name] ([id number]) -------------------------------------------------------------------------------- Problem List Details Patient Name: [name]. Date of Service: 02/23/2021 10:00 AM Medical Record Number: [id number] Patient Account Number: [id number] Date of Birth/Sex: 01-06-1956 (64 y.o. F) Treating RN: Rogers Blocker Primary Care Provider: Simone Curia Other Clinician: Lolita Cram Referring Provider: Simone Curia Treating Provider/Extender: Rowan Blase in Treatment: 3 Active Problems ICD-10 Encounter Code Description Active Date  MDM Diagnosis L02.818 Cutaneous abscess of other sites 01/27/2021 No Yes L98.492 Non-pressure chronic ulcer of skin of other sites with fat layer exposed 01/27/2021 No Yes I10 Essential (primary) hypertension 01/27/2021 No Yes E11.622 Type 2 diabetes mellitus with other skin ulcer 01/27/2021 No Yes Inactive  Janyth ContesFLINCHUM, Serine M. ([id number]) Visit Report for 02/23/2021 Chief Complaint Document Details Patient Name: Janyth ContesFLINCHUM, Emely M. Date of Service: 02/23/2021 10:00 AM Medical Record Number: [id number] Patient Account Number: [id number] Date of Birth/Sex: Jan 07, 1956 (64 y.o. F) Treating RN: Rogers BlockerSanchez, Kenia Primary Care Provider: Simone CuriaLEE, KEUNG Other Clinician: Lolita CramBurnette, Kyara Referring Provider: Simone CuriaLEE, KEUNG Treating Provider/Extender: Rowan BlaseStone, Undrea Archbold Weeks in Treatment: 3 Information Obtained from: Patient Chief Complaint Right breast cellulitis abscess Electronic Signature(s) Signed: 02/23/2021 10:21:59 AM By: Lenda KelpStone III, Kristin Lamagna PA-C Entered By: Lenda KelpStone III, Tequia Wolman on 02/23/2021 10:21:58 Janyth ContesFLINCHUM, Shante M. ([id number]) -------------------------------------------------------------------------------- HPI Details Patient Name: Janyth ContesFLINCHUM, Darianne M. Date of Service: 02/23/2021 10:00 AM Medical Record Number: [id number] Patient Account Number: [id number] Date of Birth/Sex: Jan 07, 1956 (64 y.o. F) Treating RN: Rogers BlockerSanchez, Kenia Primary Care Provider: Simone CuriaLEE, KEUNG Other Clinician: Lolita CramBurnette, Kyara Referring Provider: Simone CuriaLEE, KEUNG Treating Provider/Extender: Rowan BlaseStone, Justyce Yeater Weeks in Treatment: 3 History of Present Illness HPI Description: 01/27/2021 upon evaluation today patient appears to be doing somewhat poorly in regard to an abscess over the area just inferior to her right breast. Subsequently she tells me that this started about a month ago initially it was a raised/bumped area that then she put some drawing salve on it and it began to drain. Subsequently she did see her primary care provider and she was placed initially on a 10-day course of doxycycline which then was extended so that she would have this for at least 14 days maybe a little longer I am not sure when the first course was given to her. Nonetheless she has enough to make it through till Monday which is good news. Nonetheless unfortunately she tells me that this is  still giving her quite a bit of discomfort is also draining quite a bit she just put a little bit of antibiotic ointment on it and covering with a dressing if anything. With that being said I think that this probably represents something needs to be cleaned out probably is significantly deeper than what is obvious just on initial inspection. The patient does have a history of diabetes mellitus type 2, hypertension, and otherwise tells me she is never had anything quite like this happen before. 02/03/21 upon evaluation today patient actually is making excellent progress in regard to her wound. Fortunately there does not appear to be any signs of active infection which is great news and overall very pleased in that regard. I do feel like the patient needs to have some Santyl to help clean up the wound edges. I am really somewhat reluctant to perform a lot of debridement here as I am afraid that this can cause more damage to the area that is trying to heal already. Nonetheless I think we can use Santyl along the edges of the wound to try to help clean this up a little better. 02/10/21 upon evaluation today patient appears to be doing decently well in regard to her wound. She has been tolerating the dressing changes without complication. Fortunately there is no signs of active infection which is great news. She is using Santyl around the necrotic area and then Dakin's moistened gauze over top. 02/16/2021 upon evaluation today patient appears to be doing excellent in regard to her wound. She has been tolerating the dressing changes without complication. Fortunately there is no signs of active infection at this time which is great news. I do feel like that the Dakin's moistened gauze has done well as has the Santyl but I feel like it is time to move on to something

## 2021-03-08 DIAGNOSIS — F419 Anxiety disorder, unspecified: Secondary | ICD-10-CM | POA: Diagnosis not present

## 2021-03-08 DIAGNOSIS — M25511 Pain in right shoulder: Secondary | ICD-10-CM | POA: Diagnosis not present

## 2021-03-08 DIAGNOSIS — Z87891 Personal history of nicotine dependence: Secondary | ICD-10-CM | POA: Diagnosis not present

## 2021-03-08 DIAGNOSIS — K5909 Other constipation: Secondary | ICD-10-CM | POA: Diagnosis not present

## 2021-03-08 DIAGNOSIS — G629 Polyneuropathy, unspecified: Secondary | ICD-10-CM | POA: Diagnosis not present

## 2021-03-08 DIAGNOSIS — B359 Dermatophytosis, unspecified: Secondary | ICD-10-CM | POA: Diagnosis not present

## 2021-03-08 DIAGNOSIS — I10 Essential (primary) hypertension: Secondary | ICD-10-CM | POA: Diagnosis not present

## 2021-03-08 DIAGNOSIS — R748 Abnormal levels of other serum enzymes: Secondary | ICD-10-CM | POA: Diagnosis not present

## 2021-03-08 DIAGNOSIS — F3341 Major depressive disorder, recurrent, in partial remission: Secondary | ICD-10-CM | POA: Diagnosis not present

## 2021-03-10 ENCOUNTER — Other Ambulatory Visit: Payer: Self-pay

## 2021-03-10 ENCOUNTER — Encounter: Payer: Medicare HMO | Attending: Physician Assistant | Admitting: Physician Assistant

## 2021-03-10 DIAGNOSIS — L98492 Non-pressure chronic ulcer of skin of other sites with fat layer exposed: Secondary | ICD-10-CM | POA: Diagnosis not present

## 2021-03-10 DIAGNOSIS — E1151 Type 2 diabetes mellitus with diabetic peripheral angiopathy without gangrene: Secondary | ICD-10-CM | POA: Diagnosis not present

## 2021-03-10 DIAGNOSIS — E11622 Type 2 diabetes mellitus with other skin ulcer: Secondary | ICD-10-CM | POA: Diagnosis not present

## 2021-03-10 DIAGNOSIS — I10 Essential (primary) hypertension: Secondary | ICD-10-CM | POA: Insufficient documentation

## 2021-03-10 NOTE — Progress Notes (Addendum)
different at this point based on what I am seeing. 02/23/21 upon evaluation today patient is making excellent progress in regard to her wound. I do not see  any evidence of infection and infected opening think she did not require the Santyl any longer. This is great news. No fevers, chills, nausea, vomiting, or diarrhea. 03/10/2021 upon evaluation today patient appears to be doing excellent in regard to her wound on her chest. She has been tolerating the dressing changes without complication this is almost completely healed. I am extremely pleased with where things stand today. There is no evidence whatsoever of infection. The patient is extremely happy. Electronic Signature(s) Signed: 03/10/2021 6:05:38 PM By: Lenda Kelp PA-C Entered By: Lenda Kelp on 03/10/2021 18:05:38 Angel Reynolds ([id number]) -------------------------------------------------------------------------------- Physical Exam Details Patient Name: Angel Reynolds Date of Service: 03/10/2021 10:00 AM Medical Record Number: [id number] Patient Account Number: [id number] Date of Birth/Sex: 09-13-1956 (64 y.o. F) Treating RN: Yevonne Pax Primary Care Provider: Simone Curia Other Clinician: Lolita Cram Referring Provider: Simone Curia Treating Provider/Extender: Rowan Blase in Treatment: 6 Constitutional Well-nourished and well-hydrated in no acute distress. Respiratory normal breathing without difficulty. Psychiatric this patient is able to make decisions and demonstrates good insight into disease process. Alert and Oriented x 3. pleasant and cooperative. Notes Patient's wound bed showed signs of good granulation epithelization at this point. There does not appear to be any evidence of active infection which is great news and overall very pleased with where things stand. No fevers, chills, nausea, vomiting, or diarrhea. Electronic Signature(s) Signed: 03/10/2021 6:05:50 PM By: Lenda Kelp PA-C Entered By: Lenda Kelp on 03/10/2021 18:05:50 Angel Reynolds  ([id number]) -------------------------------------------------------------------------------- Physician Orders Details Patient Name: Angel Reynolds. Date of Service: 03/10/2021 10:00 AM Medical Record Number: [id number] Patient Account Number: [id number] Date of Birth/Sex: 03/21/1956 (64 y.o. F) Treating RN: Yevonne Pax Primary Care Provider: Simone Curia Other Clinician: Lolita Cram Referring Provider: Simone Curia Treating Provider/Extender: Rowan Blase in Treatment: 6 Verbal / Phone Orders: No Diagnosis Coding ICD-10 Coding Code Description L02.818 Cutaneous abscess of other sites L98.492 Non-pressure chronic ulcer of skin of other sites with fat layer exposed I10 Essential (primary) hypertension E11.622 Type 2 diabetes mellitus with other skin ulcer Follow-up Appointments o Return Appointment in 1 week. Wound Treatment Wound #1 - Breast Wound Laterality: Right Cleanser: Normal Saline (Generic) 3 x Per Week/30 Days Discharge Instructions: Wash your hands with soap and water. Remove old dressing, discard into plastic bag and place into trash. Cleanse the wound with Normal Saline prior to applying a clean dressing using gauze sponges, not tissues or cotton balls. Do not scrub or use excessive force. Pat dry using gauze sponges, not tissue or cotton balls. Primary Dressing: Hydrofera Blue Ready Transfer Foam, 2.5x2.5 (in/in) (Generic) 3 x Per Week/30 Days Discharge Instructions: Apply Hydrofera Blue Ready to wound bed as directed Secondary Dressing: Bordered Gauze Sterile-HBD 4x4 (in/in) (Generic) 3 x Per Week/30 Days Discharge Instructions: Cover wound with Bordered Guaze Sterile as directed Electronic Signature(s) Signed: 03/10/2021 6:22:42 PM By: Lenda Kelp PA-C Signed: 03/20/2021 8:33:59 AM By: Yevonne Pax RN Entered By: Yevonne Pax on 03/10/2021 10:16:31 Angel Reynolds  ([id number]) -------------------------------------------------------------------------------- Problem List Details Patient Name: Angel Reynolds. Date of Service: 03/10/2021 10:00 AM Medical Record Number: [id number] Patient Account Number: [id number] Date of Birth/Sex: 1956/06/30 (64 y.o. F) Treating RN: Yevonne Pax Primary Care Provider: Simone Curia Other Clinician: Lolita Cram Referring Provider: Simone Curia Treating Provider/Extender:  Angel Reynolds, Angel Reynolds ([id number]) Visit Report for 03/10/2021 Chief Complaint Document Details Patient Name: Angel Reynolds, Angel Reynolds. Date of Service: 03/10/2021 10:00 AM Medical Record Number: [id number] Patient Account Number: [id number] Date of Birth/Sex: 05-29-1956 (64 y.o. F) Treating RN: Yevonne Pax Primary Care Provider: Simone Curia Other Clinician: Lolita Cram Referring Provider: Simone Curia Treating Provider/Extender: Rowan Blase in Treatment: 6 Information Obtained from: Patient Chief Complaint Right breast cellulitis abscess Electronic Signature(s) Signed: 03/10/2021 10:14:05 AM By: Lenda Kelp PA-C Entered By: Lenda Kelp on 03/10/2021 10:14:05 Angel Reynolds ([id number]) -------------------------------------------------------------------------------- HPI Details Patient Name: Angel Reynolds. Date of Service: 03/10/2021 10:00 AM Medical Record Number: [id number] Patient Account Number: [id number] Date of Birth/Sex: October 20, 1956 (64 y.o. F) Treating RN: Yevonne Pax Primary Care Provider: Simone Curia Other Clinician: Lolita Cram Referring Provider: Simone Curia Treating Provider/Extender: Rowan Blase in Treatment: 6 History of Present Illness HPI Description: 01/27/2021 upon evaluation today patient appears to be doing somewhat poorly in regard to an abscess over the area just inferior to her right breast. Subsequently she tells me that this started about a month ago initially it was a raised/bumped area that then she put some drawing salve on it and it began to drain. Subsequently she did see her primary care provider and she was placed initially on a 10-day course of doxycycline which then was extended so that she would have this for at least 14 days maybe a little longer I am not sure when the first course was given to her. Nonetheless she has enough to make it through till Monday which is good news. Nonetheless unfortunately she tells me that this is still  giving her quite a bit of discomfort is also draining quite a bit she just put a little bit of antibiotic ointment on it and covering with a dressing if anything. With that being said I think that this probably represents something needs to be cleaned out probably is significantly deeper than what is obvious just on initial inspection. The patient does have a history of diabetes mellitus type 2, hypertension, and otherwise tells me she is never had anything quite like this happen before. 02/03/21 upon evaluation today patient actually is making excellent progress in regard to her wound. Fortunately there does not appear to be any signs of active infection which is great news and overall very pleased in that regard. I do feel like the patient needs to have some Santyl to help clean up the wound edges. I am really somewhat reluctant to perform a lot of debridement here as I am afraid that this can cause more damage to the area that is trying to heal already. Nonetheless I think we can use Santyl along the edges of the wound to try to help clean this up a little better. 02/10/21 upon evaluation today patient appears to be doing decently well in regard to her wound. She has been tolerating the dressing changes without complication. Fortunately there is no signs of active infection which is great news. She is using Santyl around the necrotic area and then Dakin's moistened gauze over top. 02/16/2021 upon evaluation today patient appears to be doing excellent in regard to her wound. She has been tolerating the dressing changes without complication. Fortunately there is no signs of active infection at this time which is great news. I do feel like that the Dakin's moistened gauze has done well as has the Santyl but I feel like it is time to move on to something  Angel Reynolds, Angel Reynolds ([id number]) Visit Report for 03/10/2021 Chief Complaint Document Details Patient Name: Angel Reynolds, Angel Reynolds. Date of Service: 03/10/2021 10:00 AM Medical Record Number: [id number] Patient Account Number: [id number] Date of Birth/Sex: 05-29-1956 (64 y.o. F) Treating RN: Yevonne Pax Primary Care Provider: Simone Curia Other Clinician: Lolita Cram Referring Provider: Simone Curia Treating Provider/Extender: Rowan Blase in Treatment: 6 Information Obtained from: Patient Chief Complaint Right breast cellulitis abscess Electronic Signature(s) Signed: 03/10/2021 10:14:05 AM By: Lenda Kelp PA-C Entered By: Lenda Kelp on 03/10/2021 10:14:05 Angel Reynolds ([id number]) -------------------------------------------------------------------------------- HPI Details Patient Name: Angel Reynolds. Date of Service: 03/10/2021 10:00 AM Medical Record Number: [id number] Patient Account Number: [id number] Date of Birth/Sex: October 20, 1956 (64 y.o. F) Treating RN: Yevonne Pax Primary Care Provider: Simone Curia Other Clinician: Lolita Cram Referring Provider: Simone Curia Treating Provider/Extender: Rowan Blase in Treatment: 6 History of Present Illness HPI Description: 01/27/2021 upon evaluation today patient appears to be doing somewhat poorly in regard to an abscess over the area just inferior to her right breast. Subsequently she tells me that this started about a month ago initially it was a raised/bumped area that then she put some drawing salve on it and it began to drain. Subsequently she did see her primary care provider and she was placed initially on a 10-day course of doxycycline which then was extended so that she would have this for at least 14 days maybe a little longer I am not sure when the first course was given to her. Nonetheless she has enough to make it through till Monday which is good news. Nonetheless unfortunately she tells me that this is still  giving her quite a bit of discomfort is also draining quite a bit she just put a little bit of antibiotic ointment on it and covering with a dressing if anything. With that being said I think that this probably represents something needs to be cleaned out probably is significantly deeper than what is obvious just on initial inspection. The patient does have a history of diabetes mellitus type 2, hypertension, and otherwise tells me she is never had anything quite like this happen before. 02/03/21 upon evaluation today patient actually is making excellent progress in regard to her wound. Fortunately there does not appear to be any signs of active infection which is great news and overall very pleased in that regard. I do feel like the patient needs to have some Santyl to help clean up the wound edges. I am really somewhat reluctant to perform a lot of debridement here as I am afraid that this can cause more damage to the area that is trying to heal already. Nonetheless I think we can use Santyl along the edges of the wound to try to help clean this up a little better. 02/10/21 upon evaluation today patient appears to be doing decently well in regard to her wound. She has been tolerating the dressing changes without complication. Fortunately there is no signs of active infection which is great news. She is using Santyl around the necrotic area and then Dakin's moistened gauze over top. 02/16/2021 upon evaluation today patient appears to be doing excellent in regard to her wound. She has been tolerating the dressing changes without complication. Fortunately there is no signs of active infection at this time which is great news. I do feel like that the Dakin's moistened gauze has done well as has the Santyl but I feel like it is time to move on to something  different at this point based on what I am seeing. 02/23/21 upon evaluation today patient is making excellent progress in regard to her wound. I do not see  any evidence of infection and infected opening think she did not require the Santyl any longer. This is great news. No fevers, chills, nausea, vomiting, or diarrhea. 03/10/2021 upon evaluation today patient appears to be doing excellent in regard to her wound on her chest. She has been tolerating the dressing changes without complication this is almost completely healed. I am extremely pleased with where things stand today. There is no evidence whatsoever of infection. The patient is extremely happy. Electronic Signature(s) Signed: 03/10/2021 6:05:38 PM By: Lenda Kelp PA-C Entered By: Lenda Kelp on 03/10/2021 18:05:38 Angel Reynolds ([id number]) -------------------------------------------------------------------------------- Physical Exam Details Patient Name: Angel Reynolds Date of Service: 03/10/2021 10:00 AM Medical Record Number: [id number] Patient Account Number: [id number] Date of Birth/Sex: 09-13-1956 (64 y.o. F) Treating RN: Yevonne Pax Primary Care Provider: Simone Curia Other Clinician: Lolita Cram Referring Provider: Simone Curia Treating Provider/Extender: Rowan Blase in Treatment: 6 Constitutional Well-nourished and well-hydrated in no acute distress. Respiratory normal breathing without difficulty. Psychiatric this patient is able to make decisions and demonstrates good insight into disease process. Alert and Oriented x 3. pleasant and cooperative. Notes Patient's wound bed showed signs of good granulation epithelization at this point. There does not appear to be any evidence of active infection which is great news and overall very pleased with where things stand. No fevers, chills, nausea, vomiting, or diarrhea. Electronic Signature(s) Signed: 03/10/2021 6:05:50 PM By: Lenda Kelp PA-C Entered By: Lenda Kelp on 03/10/2021 18:05:50 Angel Reynolds  ([id number]) -------------------------------------------------------------------------------- Physician Orders Details Patient Name: Angel Reynolds. Date of Service: 03/10/2021 10:00 AM Medical Record Number: [id number] Patient Account Number: [id number] Date of Birth/Sex: 03/21/1956 (64 y.o. F) Treating RN: Yevonne Pax Primary Care Provider: Simone Curia Other Clinician: Lolita Cram Referring Provider: Simone Curia Treating Provider/Extender: Rowan Blase in Treatment: 6 Verbal / Phone Orders: No Diagnosis Coding ICD-10 Coding Code Description L02.818 Cutaneous abscess of other sites L98.492 Non-pressure chronic ulcer of skin of other sites with fat layer exposed I10 Essential (primary) hypertension E11.622 Type 2 diabetes mellitus with other skin ulcer Follow-up Appointments o Return Appointment in 1 week. Wound Treatment Wound #1 - Breast Wound Laterality: Right Cleanser: Normal Saline (Generic) 3 x Per Week/30 Days Discharge Instructions: Wash your hands with soap and water. Remove old dressing, discard into plastic bag and place into trash. Cleanse the wound with Normal Saline prior to applying a clean dressing using gauze sponges, not tissues or cotton balls. Do not scrub or use excessive force. Pat dry using gauze sponges, not tissue or cotton balls. Primary Dressing: Hydrofera Blue Ready Transfer Foam, 2.5x2.5 (in/in) (Generic) 3 x Per Week/30 Days Discharge Instructions: Apply Hydrofera Blue Ready to wound bed as directed Secondary Dressing: Bordered Gauze Sterile-HBD 4x4 (in/in) (Generic) 3 x Per Week/30 Days Discharge Instructions: Cover wound with Bordered Guaze Sterile as directed Electronic Signature(s) Signed: 03/10/2021 6:22:42 PM By: Lenda Kelp PA-C Signed: 03/20/2021 8:33:59 AM By: Yevonne Pax RN Entered By: Yevonne Pax on 03/10/2021 10:16:31 Angel Reynolds  ([id number]) -------------------------------------------------------------------------------- Problem List Details Patient Name: Angel Reynolds. Date of Service: 03/10/2021 10:00 AM Medical Record Number: [id number] Patient Account Number: [id number] Date of Birth/Sex: 1956/06/30 (64 y.o. F) Treating RN: Yevonne Pax Primary Care Provider: Simone Curia Other Clinician: Lolita Cram Referring Provider: Simone Curia Treating Provider/Extender:  Angel Reynolds, Angel Reynolds ([id number]) Visit Report for 03/10/2021 Chief Complaint Document Details Patient Name: Angel Reynolds, Angel Reynolds. Date of Service: 03/10/2021 10:00 AM Medical Record Number: [id number] Patient Account Number: [id number] Date of Birth/Sex: 05-29-1956 (64 y.o. F) Treating RN: Yevonne Pax Primary Care Provider: Simone Curia Other Clinician: Lolita Cram Referring Provider: Simone Curia Treating Provider/Extender: Rowan Blase in Treatment: 6 Information Obtained from: Patient Chief Complaint Right breast cellulitis abscess Electronic Signature(s) Signed: 03/10/2021 10:14:05 AM By: Lenda Kelp PA-C Entered By: Lenda Kelp on 03/10/2021 10:14:05 Angel Reynolds ([id number]) -------------------------------------------------------------------------------- HPI Details Patient Name: Angel Reynolds. Date of Service: 03/10/2021 10:00 AM Medical Record Number: [id number] Patient Account Number: [id number] Date of Birth/Sex: October 20, 1956 (64 y.o. F) Treating RN: Yevonne Pax Primary Care Provider: Simone Curia Other Clinician: Lolita Cram Referring Provider: Simone Curia Treating Provider/Extender: Rowan Blase in Treatment: 6 History of Present Illness HPI Description: 01/27/2021 upon evaluation today patient appears to be doing somewhat poorly in regard to an abscess over the area just inferior to her right breast. Subsequently she tells me that this started about a month ago initially it was a raised/bumped area that then she put some drawing salve on it and it began to drain. Subsequently she did see her primary care provider and she was placed initially on a 10-day course of doxycycline which then was extended so that she would have this for at least 14 days maybe a little longer I am not sure when the first course was given to her. Nonetheless she has enough to make it through till Monday which is good news. Nonetheless unfortunately she tells me that this is still  giving her quite a bit of discomfort is also draining quite a bit she just put a little bit of antibiotic ointment on it and covering with a dressing if anything. With that being said I think that this probably represents something needs to be cleaned out probably is significantly deeper than what is obvious just on initial inspection. The patient does have a history of diabetes mellitus type 2, hypertension, and otherwise tells me she is never had anything quite like this happen before. 02/03/21 upon evaluation today patient actually is making excellent progress in regard to her wound. Fortunately there does not appear to be any signs of active infection which is great news and overall very pleased in that regard. I do feel like the patient needs to have some Santyl to help clean up the wound edges. I am really somewhat reluctant to perform a lot of debridement here as I am afraid that this can cause more damage to the area that is trying to heal already. Nonetheless I think we can use Santyl along the edges of the wound to try to help clean this up a little better. 02/10/21 upon evaluation today patient appears to be doing decently well in regard to her wound. She has been tolerating the dressing changes without complication. Fortunately there is no signs of active infection which is great news. She is using Santyl around the necrotic area and then Dakin's moistened gauze over top. 02/16/2021 upon evaluation today patient appears to be doing excellent in regard to her wound. She has been tolerating the dressing changes without complication. Fortunately there is no signs of active infection at this time which is great news. I do feel like that the Dakin's moistened gauze has done well as has the Santyl but I feel like it is time to move on to something

## 2021-03-17 ENCOUNTER — Other Ambulatory Visit: Payer: Self-pay

## 2021-03-17 ENCOUNTER — Encounter: Payer: Medicare HMO | Admitting: Physician Assistant

## 2021-03-17 DIAGNOSIS — E1151 Type 2 diabetes mellitus with diabetic peripheral angiopathy without gangrene: Secondary | ICD-10-CM | POA: Diagnosis not present

## 2021-03-17 DIAGNOSIS — L98492 Non-pressure chronic ulcer of skin of other sites with fat layer exposed: Secondary | ICD-10-CM | POA: Diagnosis not present

## 2021-03-17 DIAGNOSIS — L98498 Non-pressure chronic ulcer of skin of other sites with other specified severity: Secondary | ICD-10-CM | POA: Diagnosis not present

## 2021-03-17 DIAGNOSIS — I10 Essential (primary) hypertension: Secondary | ICD-10-CM | POA: Diagnosis not present

## 2021-03-17 DIAGNOSIS — E11622 Type 2 diabetes mellitus with other skin ulcer: Secondary | ICD-10-CM | POA: Diagnosis not present

## 2021-03-17 NOTE — Progress Notes (Addendum)
Angel Reynolds ([id number]) Visit Report for 03/17/2021 Chief Complaint Document Details Patient Name: Angel Reynolds, Angel Reynolds. Date of Service: 03/17/2021 10:30 AM Medical Record Number: [id number] Patient Account Number: [id number] Date of Birth/Sex: [birth year] (64 y.o. F) Treating RN: Rogers Blocker Primary Care Provider: Simone Curia Other Clinician: Referring Provider: Simone Curia Treating Provider/Extender: Rowan Blase in Treatment: 7 Information Obtained from: Patient Chief Complaint Right breast cellulitis abscess Electronic Signature(s) Signed: 03/17/2021 10:45:29 AM By: Lenda Kelp PA-C Entered By: Lenda Kelp on 03/17/2021 10:45:29 Angel Reynolds ([id number]) -------------------------------------------------------------------------------- HPI Details Patient Name: Angel Reynolds. Date of Service: 03/17/2021 10:30 AM Medical Record Number: [id number] Patient Account Number: [id number] Date of Birth/Sex: 17-Apr-1956 (64 y.o. F) Treating RN: Rogers Blocker Primary Care Provider: Simone Curia Other Clinician: Referring Provider: Simone Curia Treating Provider/Extender: Rowan Blase in Treatment: 7 History of Present Illness HPI Description: 01/27/2021 upon evaluation today patient appears to be doing somewhat poorly in regard to an abscess over the area just inferior to her right breast. Subsequently she tells me that this started about a month ago initially it was a raised/bumped area that then she put some drawing salve on it and it began to drain. Subsequently she did see her primary care provider and she was placed initially on a 10-day course of doxycycline which then was extended so that she would have this for at least 14 days maybe a little longer I am not sure when the first course was given to her. Nonetheless she has enough to make it through till Monday which is good news. Nonetheless unfortunately she tells me that this is still giving her quite a bit of  discomfort is also draining quite a bit she just put a little bit of antibiotic ointment on it and covering with a dressing if anything. With that being said I think that this probably represents something needs to be cleaned out probably is significantly deeper than what is obvious just on initial inspection. The patient does have a history of diabetes mellitus type 2, hypertension, and otherwise tells me she is never had anything quite like this happen before. 02/03/21 upon evaluation today patient actually is making excellent progress in regard to her wound. Fortunately there does not appear to be any signs of active infection which is great news and overall very pleased in that regard. I do feel like the patient needs to have some Santyl to help clean up the wound edges. I am really somewhat reluctant to perform a lot of debridement here as I am afraid that this can cause more damage to the area that is trying to heal already. Nonetheless I think we can use Santyl along the edges of the wound to try to help clean this up a little better. 02/10/21 upon evaluation today patient appears to be doing decently well in regard to her wound. She has been tolerating the dressing changes without complication. Fortunately there is no signs of active infection which is great news. She is using Santyl around the necrotic area and then Dakin's moistened gauze over top. 02/16/2021 upon evaluation today patient appears to be doing excellent in regard to her wound. She has been tolerating the dressing changes without complication. Fortunately there is no signs of active infection at this time which is great news. I do feel like that the Dakin's moistened gauze has done well as has the Santyl but I feel like it is time to move on to something different at this point  based on what I am seeing. 02/23/21 upon evaluation today patient is making excellent progress in regard to her wound. I do not see any evidence of infection  and infected opening think she did not require the Santyl any longer. This is great news. No fevers, chills, nausea, vomiting, or diarrhea. 03/10/2021 upon evaluation today patient appears to be doing excellent in regard to her wound on her chest. She has been tolerating the dressing changes without complication this is almost completely healed. I am extremely pleased with where things stand today. There is no evidence whatsoever of infection. The patient is extremely happy. 03/17/2021 upon evaluation today patient appears to be doing excellent in regard to her wound. Fortunately there does not appear to be any signs of infection in fact this is completely closed today and is excellent news. No fevers, chills, nausea, vomiting, or diarrhea. Electronic Signature(s) Signed: 03/17/2021 10:55:45 AM By: Lenda Kelp PA-C Entered By: Lenda Kelp on 03/17/2021 10:55:44 Angel Reynolds ([id number]) -------------------------------------------------------------------------------- Physical Exam Details Patient Name: Angel Reynolds Date of Service: 03/17/2021 10:30 AM Medical Record Number: [id number] Patient Account Number: [id number] Date of Birth/Sex: Jul 07, 1956 (64 y.o. F) Treating RN: Rogers Blocker Primary Care Provider: Simone Curia Other Clinician: Referring Provider: Simone Curia Treating Provider/Extender: Rowan Blase in Treatment: 7 Constitutional Well-nourished and well-hydrated in no acute distress. Respiratory normal breathing without difficulty. Psychiatric this patient is able to make decisions and demonstrates good insight into disease process. Alert and Oriented x 3. pleasant and cooperative. Notes Upon inspection patient's wound bed actually showed signs of good granulation epithelization at this point. There does not appear to be Any evidence of active infection which is great news and overall I am extremely pleased with where things stand today. I think that she is  completely healed and overall she is extremely excited. Electronic Signature(s) Signed: 03/17/2021 10:56:18 AM By: Lenda Kelp PA-C Entered By: Lenda Kelp on 03/17/2021 10:56:18 Angel Reynolds ([id number]) -------------------------------------------------------------------------------- Physician Orders Details Patient Name: Angel Reynolds. Date of Service: 03/17/2021 10:30 AM Medical Record Number: [id number] Patient Account Number: [id number] Date of Birth/Sex: January 29, 1956 (64 y.o. F) Treating RN: Rogers Blocker Primary Care Provider: Simone Curia Other Clinician: Referring Provider: Simone Curia Treating Provider/Extender: Rowan Blase in Treatment: 7 Verbal / Phone Orders: No Diagnosis Coding ICD-10 Coding Code Description L02.818 Cutaneous abscess of other sites L98.492 Non-pressure chronic ulcer of skin of other sites with fat layer exposed I10 Essential (primary) hypertension E11.622 Type 2 diabetes mellitus with other skin ulcer Discharge From [facility] o Discharge from Wound Care Center Treatment Complete - Continue wearing protective dressing for 1 month Electronic Signature(s) Signed: 03/17/2021 11:53:36 AM By: Phillis Haggis, Dondra Prader RN Signed: 03/20/2021 5:15:41 PM By: Lenda Kelp PA-C Entered By: Phillis Haggis, Dondra Prader on 03/17/2021 10:52:32 Angel Reynolds ([id number]) -------------------------------------------------------------------------------- Problem List Details Patient Name: Angel Reynolds. Date of Service: 03/17/2021 10:30 AM Medical Record Number: [id number] Patient Account Number: [id number] Date of Birth/Sex: Sep 30, 1956 (64 y.o. F) Treating RN: Rogers Blocker Primary Care Provider: Simone Curia Other Clinician: Referring Provider: Simone Curia Treating Provider/Extender: Rowan Blase in Treatment: 7 Active Problems ICD-10 Encounter Code Description Active Date MDM Diagnosis L02.818 Cutaneous abscess of other sites 01/27/2021 No  Yes L98.492 Non-pressure chronic ulcer of skin of other sites with fat layer exposed 01/27/2021 No Yes I10 Essential (primary) hypertension 01/27/2021 No Yes E11.622 Type 2 diabetes mellitus with other skin ulcer 01/27/2021 No Yes Inactive Problems Resolved Problems  Electronic Signature(s) Signed: 03/17/2021 10:45:23 AM By: Lenda Kelp PA-C Entered By: Lenda Kelp on 03/17/2021 10:45:22 Angel Reynolds ([id number]) -------------------------------------------------------------------------------- Progress Note Details Patient Name: Angel Reynolds. Date of Service: 03/17/2021 10:30 AM Medical Record Number: [id number] Patient Account Number: [id number] Date of Birth/Sex: 04-18-1956 (64 y.o. F) Treating RN: Rogers Blocker Primary Care Provider: Simone Curia Other Clinician: Referring Provider: Simone Curia Treating Provider/Extender: Rowan Blase in Treatment: 7 Subjective Chief Complaint Information obtained from Patient Right breast cellulitis abscess History of Present Illness (HPI) 01/27/2021 upon evaluation today patient appears to be doing somewhat poorly in regard to an abscess over the area just inferior to her right breast. Subsequently she tells me that this started about a month ago initially it was a raised/bumped area that then she put some drawing salve on it and it began to drain. Subsequently she did see her primary care provider and she was placed initially on a 10-day course of doxycycline which then was extended so that she would have this for at least 14 days maybe a little longer I am not sure when the first course was given to her. Nonetheless she has enough to make it through till Monday which is good news. Nonetheless unfortunately she tells me that this is still giving her quite a bit of discomfort is also draining quite a bit she just put a little bit of antibiotic ointment on it and covering with a dressing if anything. With that being said I think that this  probably represents something needs to be cleaned out probably is significantly deeper than what is obvious just on initial inspection. The patient does have a history of diabetes mellitus type 2, hypertension, and otherwise tells me she is never had anything quite like this happen before. 02/03/21 upon evaluation today patient actually is making excellent progress in regard to her wound. Fortunately there does not appear to be any signs of active infection which is great news and overall very pleased in that regard. I do feel like the patient needs to have some Santyl to help clean up the wound edges. I am really somewhat reluctant to perform a lot of debridement here as I am afraid that this can cause more damage to the area that is trying to heal already. Nonetheless I think we can use Santyl along the edges of the wound to try to help clean this up a little better. 02/10/21 upon evaluation today patient appears to be doing decently well in regard to her wound. She has been tolerating the dressing changes without complication. Fortunately there is no signs of active infection which is great news. She is using Santyl around the necrotic area and then Dakin's moistened gauze over top. 02/16/2021 upon evaluation today patient appears to be doing excellent in regard to her wound. She has been tolerating the dressing changes without complication. Fortunately there is no signs of active infection at this time which is great news. I do feel like that the Dakin's moistened gauze has done well as has the Santyl but I feel like it is time to move on to something different at this point based on what I am seeing. 02/23/21 upon evaluation today patient is making excellent progress in regard to her wound. I do not see any evidence of infection and infected opening think she did not require the Santyl any longer. This is great news. No fevers, chills, nausea, vomiting, or diarrhea. 03/10/2021 upon evaluation today  patient appears to be  doing excellent in regard to her wound on her chest. She has been tolerating the dressing changes without complication this is almost completely healed. I am extremely pleased with where things stand today. There is no evidence whatsoever of infection. The patient is extremely happy. 03/17/2021 upon evaluation today patient appears to be doing excellent in regard to her wound. Fortunately there does not appear to be any signs of infection in fact this is completely closed today and is excellent news. No fevers, chills, nausea, vomiting, or diarrhea. Objective Constitutional Well-nourished and well-hydrated in no acute distress. Vitals Time Taken: 10:36 AM, Height: 62 in, Weight: 177 lbs, BMI: 32.4, Temperature: 97.8 F, Pulse: 91 bpm, Respiratory Rate: 18 breaths/min, Blood Pressure: 146/83 mmHg. Respiratory normal breathing without difficulty. Psychiatric this patient is able to make decisions and demonstrates good insight into disease process. Alert and Oriented x 3. pleasant and cooperative. Angel Reynolds ([id number]) General Notes: Upon inspection patient's wound bed actually showed signs of good granulation epithelization at this point. There does not appear to be Any evidence of active infection which is great news and overall I am extremely pleased with where things stand today. I think that she is completely healed and overall she is extremely excited. Integumentary (Hair, Skin) Wound #1 status is Healed - Epithelialized. Original cause of wound was Gradually Appeared. The date acquired was: 12/30/2020. The wound has been in treatment 7 weeks. The wound is located on the Right Breast. The wound measures 0cm length x 0cm width x 0cm depth; 0cm^2 area and 0cm^3 volume. There is no tunneling or undermining noted. There is a none present amount of drainage noted. There is no granulation within the wound bed. There is no necrotic tissue within the wound  bed. Assessment Active Problems ICD-10 Cutaneous abscess of other sites Non-pressure chronic ulcer of skin of other sites with fat layer exposed Essential (primary) hypertension Type 2 diabetes mellitus with other skin ulcer Plan Discharge From [facility] Services: Discharge from Wound Care Center Treatment Complete - Continue wearing protective dressing for 1 month 1. Would recommend that we go ahead and discontinue wound care services at this point as the patient appears to be doing extremely well. With that being said I would recommend a protective dressing over the area for the next month in order to ensure this does not attempt to reopen. 2. After that time she can use powder in the area to prevent any moisture from causing breakdown. We will see the patient back for follow-up visit as needed. Electronic Signature(s) Signed: 03/17/2021 10:56:48 AM By: Lenda Kelp PA-C Entered By: Lenda Kelp on 03/17/2021 10:56:47 Angel Reynolds ([id number]) -------------------------------------------------------------------------------- SuperBill Details Patient Name: Angel Reynolds. Date of Service: 03/17/2021 Medical Record Number: [id number] Patient Account Number: [id number] Date of Birth/Sex: 1956/01/06 (64 y.o. F) Treating RN: Rogers Blocker Primary Care Provider: Simone Curia Other Clinician: Referring Provider: Simone Curia Treating Provider/Extender: Rowan Blase in Treatment: 7 Diagnosis Coding ICD-10 Codes Code Description L02.818 Cutaneous abscess of other sites L98.492 Non-pressure chronic ulcer of skin of other sites with fat layer exposed I10 Essential (primary) hypertension E11.622 Type 2 diabetes mellitus with other skin ulcer Facility Procedures CPT4 Code: 13244010 Description: 27253 - WOUND CARE VISIT-LEV 2 EST PT Modifier: Quantity: 1 Physician Procedures CPT4 Code: [id number] Description: 99213 - WC PHYS LEVEL 3 - EST PT Modifier: Quantity: 1 CPT4  Code: Description: ICD-10 Diagnosis Description L02.818 Cutaneous abscess of other sites L98.492 Non-pressure chronic ulcer of skin of other  sites with fat layer expos I10 Essential (primary) hypertension E11.622 Type 2 diabetes mellitus with other skin ulcer Modifier: ed Quantity: Electronic Signature(s) Signed: 03/17/2021 10:57:01 AM By: Lenda Kelp PA-C Entered By: Lenda Kelp on 03/17/2021 10:57:01

## 2021-03-17 NOTE — Progress Notes (Signed)
Angel Reynolds ([id number]) Visit Report for 03/17/2021 Arrival Information Details Patient Name: Angel Reynolds. Date of Service: 03/17/2021 10:30 AM Medical Record Number: [id number] Patient Account Number: [id number] Date of Birth/Sex: 1956-01-19 (64 y.o. F) Treating RN: Rogers Blocker Primary Care Lorance Pickeral: Simone Curia Other Clinician: Referring Berneice Zettlemoyer: Simone Curia Treating Zyon Rosser/Extender: Rowan Blase in Treatment: 7 Visit Information History Since Last Visit Added or deleted any medications: No Patient Arrived: Ambulatory Had a fall or experienced change in No Arrival Time: 10:35 activities of daily living that may affect Accompanied By: husband risk of falls: Transfer Assistance: None Hospitalized since last visit: No Patient Identification Verified: Yes Pain Present Now: No Secondary Verification Process Completed: Yes Patient Has Alerts: Yes Patient Alerts: Type II Diabetic Electronic Signature(s) Signed: 03/17/2021 11:20:32 AM By: Lolita Cram Entered By: Lolita Cram on 03/17/2021 10:36:07 Angel Reynolds ([id number]) -------------------------------------------------------------------------------- Clinic Level of Care Assessment Details Patient Name: Angel Reynolds. Date of Service: 03/17/2021 10:30 AM Medical Record Number: [id number] Patient Account Number: [id number] Date of Birth/Sex: [birth year] (64 y.o. F) Treating RN: Rogers Blocker Primary Care Taji Barretto: Simone Curia Other Clinician: Referring Kyesha Balla: Simone Curia Treating Kalyb Pemble/Extender: Rowan Blase in Treatment: 7 Clinic Level of Care Assessment Items TOOL 4 Quantity Score X - Use when only an EandM is performed on FOLLOW-UP visit 1 0 ASSESSMENTS - Nursing Assessment / Reassessment X - Reassessment of Co-morbidities (includes updates in patient status) 1 10 X- 1 5 Reassessment of Adherence to Treatment Plan ASSESSMENTS - Wound and Skin Assessment / Reassessment []  - Simple  Wound Assessment / Reassessment - one wound 0 []  - 0 Complex Wound Assessment / Reassessment - multiple wounds []  - 0 Dermatologic / Skin Assessment (not related to wound area) ASSESSMENTS - Focused Assessment []  - Circumferential Edema Measurements - multi extremities 0 []  - 0 Nutritional Assessment / Counseling / Intervention []  - 0 Lower Extremity Assessment (monofilament, tuning fork, pulses) []  - 0 Peripheral Arterial Disease Assessment (using hand held doppler) ASSESSMENTS - Ostomy and/or Continence Assessment and Care []  - Incontinence Assessment and Management 0 []  - 0 Ostomy Care Assessment and Management (repouching, etc.) PROCESS - Coordination of Care X - Simple Patient / Family Education for ongoing care 1 15 []  - 0 Complex (extensive) Patient / Family Education for ongoing care []  - 0 Staff obtains Chiropractor, Records, Test Results / Process Orders []  - 0 Staff telephones HHA, Nursing Homes / Clarify orders / etc []  - 0 Routine Transfer to another Facility (non-emergent condition) []  - 0 Routine Hospital Admission (non-emergent condition) []  - 0 New Admissions / Manufacturing engineer / Ordering NPWT, Apligraf, etc. []  - 0 Emergency Hospital Admission (emergent condition) X- 1 10 Simple Discharge Coordination []  - 0 Complex (extensive) Discharge Coordination PROCESS - Special Needs []  - Pediatric / Minor Patient Management 0 []  - 0 Isolation Patient Management []  - 0 Hearing / Language / Visual special needs []  - 0 Assessment of Community assistance (transportation, D/C planning, etc.) []  - 0 Additional assistance / Altered mentation []  - 0 Support Surface(s) Assessment (bed, cushion, seat, etc.) INTERVENTIONS - Wound Cleansing / Measurement TASCHA, BATTE. ([id number]) []  - 0 Simple Wound Cleansing - one wound []  - 0 Complex Wound Cleansing - multiple wounds []  - 0 Wound Imaging (photographs - any number of wounds) []  - 0 Wound Tracing  (instead of photographs) []  - 0 Simple Wound Measurement - one wound []  - 0 Complex Wound Measurement - multiple wounds INTERVENTIONS - Wound Dressings  X - Small Wound Dressing one or multiple wounds 1 10 []  - 0 Medium Wound Dressing one or multiple wounds []  - 0 Large Wound Dressing one or multiple wounds []  - 0 Application of Medications - topical []  - 0 Application of Medications - injection INTERVENTIONS - Miscellaneous []  - External ear exam 0 []  - 0 Specimen Collection (cultures, biopsies, blood, body fluids, etc.) []  - 0 Specimen(s) / Culture(s) sent or taken to Lab for analysis []  - 0 Patient Transfer (multiple staff / Nurse, adult / Similar devices) []  - 0 Simple Staple / Suture removal (25 or less) []  - 0 Complex Staple / Suture removal (26 or more) []  - 0 Hypo / Hyperglycemic Management (close monitor of Blood Glucose) []  - 0 Ankle / Brachial Index (ABI) - do not check if billed separately X- 1 5 Vital Signs Has the patient been seen at the hospital within the last three years: Yes Total Score: 55 Level Of Care: New/Established - Level 2 Electronic Signature(s) Signed: 03/17/2021 11:53:36 AM By: Phillis Haggis, Dondra Prader RN Entered By: Phillis Haggis, Kenia on 03/17/2021 10:55:06 Angel Reynolds ([id number]) -------------------------------------------------------------------------------- Encounter Discharge Information Details Patient Name: Angel Reynolds. Date of Service: 03/17/2021 10:30 AM Medical Record Number: [id number] Patient Account Number: [id number] Date of Birth/Sex: [birth year] (64 y.o. F) Treating RN: Rogers Blocker Primary Care Delmy Holdren: Simone Curia Other Clinician: Referring Prinston Kynard: Simone Curia Treating Taja Pentland/Extender: Rowan Blase in Treatment: 7 Encounter Discharge Information Items Discharge Condition: Stable Ambulatory Status: Ambulatory Discharge Destination: Home Transportation: Private Auto Accompanied By: husband Schedule  Follow-up Appointment: No Clinical Summary of Care: Electronic Signature(s) Signed: 03/17/2021 11:53:36 AM By: Phillis Haggis, Dondra Prader RN Entered By: Phillis Haggis, Dondra Prader on 03/17/2021 10:55:48 Angel Reynolds ([id number]) -------------------------------------------------------------------------------- Lower Extremity Assessment Details Patient Name: Angel Reynolds. Date of Service: 03/17/2021 10:30 AM Medical Record Number: [id number] Patient Account Number: [id number] Date of Birth/Sex: 01-Jan-1956 (64 y.o. F) Treating RN: Rogers Blocker Primary Care Myson Levi: Simone Curia Other Clinician: Referring Illa Enlow: Simone Curia Treating Kavontae Pritchard/Extender: Rowan Blase in Treatment: 7 Electronic Signature(s) Signed: 03/17/2021 11:20:32 AM By: Lolita Cram Signed: 03/17/2021 11:53:36 AM By: Phillis Haggis, Dondra Prader RN Entered By: Lolita Cram on 03/17/2021 10:42:27 Angel Reynolds ([id number]) -------------------------------------------------------------------------------- Multi Wound Chart Details Patient Name: Angel Reynolds. Date of Service: 03/17/2021 10:30 AM Medical Record Number: [id number] Patient Account Number: [id number] Date of Birth/Sex: 01/22/1956 (64 y.o. F) Treating RN: Rogers Blocker Primary Care Orpha Dain: Simone Curia Other Clinician: Referring Jaxsyn Azam: Simone Curia Treating Nathanyel Defenbaugh/Extender: Rowan Blase in Treatment: 7 Vital Signs Height(in): 62 Pulse(bpm): 91 Weight(lbs): 177 Blood Pressure(mmHg): 146/83 Body Mass Index(BMI): 32 Temperature(F): 97.8 Respiratory Rate(breaths/min): 18 Photos: [N/A:N/A] Wound Location: Right Breast N/A N/A Wounding Event: Gradually Appeared N/A N/A Primary Etiology: Abscess N/A N/A Comorbid History: Hypertension, Type II Diabetes, N/A N/A Osteoarthritis Date Acquired: 12/30/2020 N/A N/A Weeks of Treatment: 7 N/A N/A Wound Status: Healed - Epithelialized N/A N/A Measurements L x W x D (cm) 0x0x0 N/A N/A Area (cm) :  0 N/A N/A Volume (cm) : 0 N/A N/A % Reduction in Area: 100.00% N/A N/A % Reduction in Volume: 100.00% N/A N/A Classification: Full Thickness Without Exposed N/A N/A Support Structures Exudate Amount: None Present N/A N/A Granulation Amount: None Present (0%) N/A N/A Necrotic Amount: None Present (0%) N/A N/A Exposed Structures: Fascia: No N/A N/A Fat Layer (Subcutaneous Tissue): No Tendon: No Muscle: No Joint: No Bone: No Epithelialization: Large (67-100%) N/A N/A Treatment Notes Electronic Signature(s) Signed: 03/17/2021 11:53:36 AM  By: Phillis Haggis, Dondra Prader RN Entered By: Phillis Haggis, Dondra Prader on 03/17/2021 10:52:01 Angel Reynolds ([id number]) -------------------------------------------------------------------------------- Multi-Disciplinary Care Plan Details Patient Name: Angel Reynolds. Date of Service: 03/17/2021 10:30 AM Medical Record Number: [id number] Patient Account Number: [id number] Date of Birth/Sex: 08/02/1956 (64 y.o. F) Treating RN: Rogers Blocker Primary Care Rosealynn Mateus: Simone Curia Other Clinician: Referring Joslin Doell: Simone Curia Treating Henok Heacock/Extender: Rowan Blase in Treatment: 7 Active Inactive Electronic Signature(s) Signed: 03/17/2021 11:53:36 AM By: Phillis Haggis, Dondra Prader RN Entered By: Phillis Haggis, Dondra Prader on 03/17/2021 10:51:54 Angel Reynolds ([id number]) -------------------------------------------------------------------------------- Pain Assessment Details Patient Name: Angel Reynolds. Date of Service: 03/17/2021 10:30 AM Medical Record Number: [id number] Patient Account Number: [id number] Date of Birth/Sex: 1956-05-04 (64 y.o. F) Treating RN: Rogers Blocker Primary Care Tauri Ethington: Simone Curia Other Clinician: Referring Kaniya Trueheart: Simone Curia Treating Nethan Caudillo/Extender: Rowan Blase in Treatment: 7 Active Problems Location of Pain Severity and Description of Pain Patient Has Paino No Site Locations Rate the pain. Current  Pain Level: 0 Pain Management and Medication Current Pain Management: Electronic Signature(s) Signed: 03/17/2021 11:20:32 AM By: Lolita Cram Signed: 03/17/2021 11:53:36 AM By: Phillis Haggis, Dondra Prader RN Entered By: Lolita Cram on 03/17/2021 10:38:45 Angel Reynolds ([id number]) -------------------------------------------------------------------------------- Patient/Caregiver Education Details Patient Name: Angel Reynolds. Date of Service: 03/17/2021 10:30 AM Medical Record Number: [id number] Patient Account Number: [id number] Date of Birth/Gender: Mar 26, 1956 (64 y.o. F) Treating RN: Rogers Blocker Primary Care Physician: Simone Curia Other Clinician: Referring Physician: Simone Curia Treating Physician/Extender: Rowan Blase in Treatment: 7 Education Assessment Education Provided To: Patient Education Topics Provided Notes discharge education Electronic Signature(s) Signed: 03/17/2021 11:53:36 AM By: Phillis Haggis, Dondra Prader RN Entered By: Phillis Haggis, Dondra Prader on 03/17/2021 10:55:23 Angel Reynolds ([id number]) -------------------------------------------------------------------------------- Wound Assessment Details Patient Name: Angel Reynolds. Date of Service: 03/17/2021 10:30 AM Medical Record Number: [id number] Patient Account Number: [id number] Date of Birth/Sex: 11/18/1955 (64 y.o. F) Treating RN: Rogers Blocker Primary Care Ashliegh Parekh: Simone Curia Other Clinician: Referring Dezhane Staten: Simone Curia Treating Kadence Mikkelson/Extender: Rowan Blase in Treatment: 7 Wound Status Wound Number: 1 Primary Etiology: Abscess Wound Location: Right Breast Wound Status: Healed - Epithelialized Wounding Event: Gradually Appeared Comorbid History: Hypertension, Type II Diabetes, Osteoarthritis Date Acquired: 12/30/2020 Weeks Of Treatment: 7 Clustered Wound: No Photos Wound Measurements Length: (cm) 0 Width: (cm) 0 Depth: (cm) 0 Area: (cm) 0 Volume: (cm) 0 % Reduction in  Area: 100% % Reduction in Volume: 100% Epithelialization: Large (67-100%) Tunneling: No Undermining: No Wound Description Classification: Full Thickness Without Exposed Support Structures Exudate Amount: None Present Foul Odor After Cleansing: No Slough/Fibrino No Wound Bed Granulation Amount: None Present (0%) Exposed Structure Necrotic Amount: None Present (0%) Fascia Exposed: No Fat Layer (Subcutaneous Tissue) Exposed: No Tendon Exposed: No Muscle Exposed: No Joint Exposed: No Bone Exposed: No Electronic Signature(s) Signed: 03/17/2021 11:53:36 AM By: Phillis Haggis, Dondra Prader RN Entered By: Phillis Haggis, Kenia on 03/17/2021 10:51:41 Angel Reynolds ([id number]) -------------------------------------------------------------------------------- Vitals Details Patient Name: Angel Reynolds. Date of Service: 03/17/2021 10:30 AM Medical Record Number: [id number] Patient Account Number: [id number] Date of Birth/Sex: 09-May-1956 (64 y.o. F) Treating RN: Rogers Blocker Primary Care Kynlea Blackston: Simone Curia Other Clinician: Referring Jayley Hustead: Simone Curia Treating Quinto Tippy/Extender: Rowan Blase in Treatment: 7 Vital Signs Time Taken: 10:36 Temperature (F): 97.8 Height (in): 62 Pulse (bpm): 91 Weight (lbs): 177 Respiratory Rate (breaths/min): 18 Body Mass Index (BMI): 32.4 Blood Pressure (mmHg): 146/83 Reference Range: 80 - 120 mg / dl Electronic Signature(s) Signed: 03/17/2021 11:20:32 AM By: Rosezetta Schlatter,  Jola Schmidt Entered By: Lolita Cram on 03/17/2021 10:38:38

## 2021-03-20 NOTE — Progress Notes (Signed)
wounds INTERVENTIONS - Wound Dressings X - Small Wound Dressing one or multiple wounds 1 10 '[]'  - 0 Medium Wound Dressing one or multiple wounds '[]'  - 0 Large Wound Dressing one or multiple wounds X- 1 5 Application of Medications - topical '[]'  - 0 Application of Medications - injection INTERVENTIONS - Miscellaneous '[]'  - External ear exam 0 '[]'  - 0 Specimen Collection (cultures, biopsies, blood, body fluids, etc.) '[]'  - 0 Specimen(s) / Culture(s) sent or taken to Lab for analysis '[]'  - 0 Patient Transfer (multiple staff / Civil Service fast streamer / Similar devices) '[]'  - 0 Simple Staple / Suture removal (25 or less) '[]'  - 0 Complex Staple / Suture removal (26 or more) '[]'  - 0 Hypo / Hyperglycemic Management (close monitor of Blood Glucose) '[]'  - 0 Ankle / Brachial Index (ABI) - do not check if billed separately X- 1 5 Vital Signs Has the patient been seen at the hospital within the last three years: Yes Total Score: 80 Level Of Care: New/Established - Level 3 Electronic Signature(s) Signed: 03/20/2021 8:33:59 AM By: Carlene Coria RN Entered By: Carlene Coria on 03/10/2021 10:17:15 Angel Reynolds ([id number]) -------------------------------------------------------------------------------- Encounter Discharge Information Details Patient Name: Angel Reynolds. Date of Service: 03/10/2021 10:00 AM Medical Record Number: [id number] Patient Account Number: [id number] Date of Birth/Sex: November 24, 1955 (64 y.o. F) Treating RN: Donnamarie Poag Primary Care Santhosh Gulino: Cher Nakai Other Clinician: Jeanine Luz Referring Deneise Getty: Cher Nakai Treating Kanasia Gayman/Extender: Skipper Cliche in Treatment: 6 Encounter Discharge Information Items Discharge Condition: Stable Ambulatory Status: Ambulatory Discharge Destination: Home Transportation: Private Auto Accompanied By:  husband Schedule Follow-up Appointment: Yes Clinical Summary of Care: Electronic Signature(s) Signed: 03/10/2021 4:26:14 PM By: Donnamarie Poag Entered By: Donnamarie Poag on 03/10/2021 10:23:13 Angel Reynolds ([id number]) -------------------------------------------------------------------------------- Lower Extremity Assessment Details Patient Name: Angel Reynolds. Date of Service: 03/10/2021 10:00 AM Medical Record Number: [id number] Patient Account Number: [id number] Date of Birth/Sex: February 14, 1956 (64 y.o. F) Treating RN: Carlene Coria Primary Care Ariza Evans: Cher Nakai Other Clinician: Jeanine Luz Referring Marylynn Rigdon: Cher Nakai Treating Alanta Scobey/Extender: Skipper Cliche in Treatment: 6 Electronic Signature(s) Signed: 03/10/2021 3:59:56 PM By: Jeanine Luz Signed: 03/20/2021 8:33:59 AM By: Carlene Coria RN Entered By: Jeanine Luz on 03/10/2021 10:10:10 Angel Reynolds ([id number]) -------------------------------------------------------------------------------- Multi Wound Chart Details Patient Name: Angel Reynolds. Date of Service: 03/10/2021 10:00 AM Medical Record Number: [id number] Patient Account Number: [id number] Date of Birth/Sex: [birth year] (64 y.o. F) Treating RN: Carlene Coria Primary Care Yishai Rehfeld: Cher Nakai Other Clinician: Jeanine Luz Referring Cherylann Hobday: Cher Nakai Treating Jakyria Bleau/Extender: Skipper Cliche in Treatment: 6 Vital Signs Height(in): 62 Pulse(bpm): 80 Weight(lbs): 177 Blood Pressure(mmHg): 151/74 Body Mass Index(BMI): 32 Temperature(F): 98.2 Respiratory Rate(breaths/min): 18 Photos: [N/A:N/A] Wound Location: Right Breast N/A N/A Wounding Event: Gradually Appeared N/A N/A Primary Etiology: Abscess N/A N/A Comorbid History: Hypertension, Type II Diabetes, N/A N/A Osteoarthritis Date Acquired: 12/30/2020 N/A N/A Weeks of Treatment: 6 N/A N/A Wound Status: Open N/A N/A Measurements L x W x D (cm) 0.6x0.3x0.1 N/A N/A Area (cm) : 0.141  N/A N/A Volume (cm) : 0.014 N/A N/A % Reduction in Area: 96.00% N/A N/A % Reduction in Volume: 99.30% N/A N/A Classification: Full Thickness Without Exposed N/A N/A Support Structures Exudate Amount: Small N/A N/A Exudate Type: Sanguinous N/A N/A Exudate Color: red N/A N/A Granulation Amount: Large (67-100%) N/A N/A Granulation Quality: Red, Pink N/A N/A Necrotic Amount: None Present (0%) N/A N/A Exposed Structures: Fat Layer (Subcutaneous Tissue): N/A N/A Yes Fascia: No Tendon: No Muscle: No  Angel Reynolds, Angel Reynolds ([id number]) Visit Report for 03/10/2021 Arrival Information Details Patient Name: Angel Reynolds, Angel Reynolds. Date of Service: 03/10/2021 10:00 AM Medical Record Number: [id number] Patient Account Number: [id number] Date of Birth/Sex: 1956/08/03 (64 y.o. F) Treating RN: Carlene Coria Primary Care Zeth Buday: Cher Nakai Other Clinician: Jeanine Luz Referring Avanni Turnbaugh: Cher Nakai Treating Raeanne Deschler/Extender: Skipper Cliche in Treatment: 6 Visit Information History Since Last Visit Added or deleted any medications: No Patient Arrived: Ambulatory Had a fall or experienced change in No Arrival Time: 10:01 activities of daily living that may affect Accompanied By: husband risk of falls: Transfer Assistance: None Hospitalized since last visit: No Patient Identification Verified: Yes Pain Present Now: No Secondary Verification Process Completed: Yes Patient Has Alerts: Yes Patient Alerts: Type II Diabetic Electronic Signature(s) Signed: 03/10/2021 3:59:56 PM By: Jeanine Luz Entered By: Jeanine Luz on 03/10/2021 10:02:28 Angel Reynolds ([id number]) -------------------------------------------------------------------------------- Clinic Level of Care Assessment Details Patient Name: Angel Reynolds. Date of Service: 03/10/2021 10:00 AM Medical Record Number: [id number] Patient Account Number: [id number] Date of Birth/Sex: [birth year] (64 y.o. F) Treating RN: Carlene Coria Primary Care Lennyn Bellanca: Cher Nakai Other Clinician: Jeanine Luz Referring Kaegan Hettich: Cher Nakai Treating Jireh Vinas/Extender: Skipper Cliche in Treatment: 6 Clinic Level of Care Assessment Items TOOL 4 Quantity Score X - Use when only an EandM is performed on FOLLOW-UP visit 1 0 ASSESSMENTS - Nursing Assessment / Reassessment X - Reassessment of Co-morbidities (includes updates in patient status) 1 10 X- 1 5 Reassessment of Adherence to Treatment Plan ASSESSMENTS - Wound and Skin Assessment /  Reassessment X - Simple Wound Assessment / Reassessment - one wound 1 5 '[]'  - 0 Complex Wound Assessment / Reassessment - multiple wounds '[]'  - 0 Dermatologic / Skin Assessment (not related to wound area) ASSESSMENTS - Focused Assessment '[]'  - Circumferential Edema Measurements - multi extremities 0 '[]'  - 0 Nutritional Assessment / Counseling / Intervention '[]'  - 0 Lower Extremity Assessment (monofilament, tuning fork, pulses) '[]'  - 0 Peripheral Arterial Disease Assessment (using hand held doppler) ASSESSMENTS - Ostomy and/or Continence Assessment and Care '[]'  - Incontinence Assessment and Management 0 '[]'  - 0 Ostomy Care Assessment and Management (repouching, etc.) PROCESS - Coordination of Care X - Simple Patient / Family Education for ongoing care 1 15 '[]'  - 0 Complex (extensive) Patient / Family Education for ongoing care '[]'  - 0 Staff obtains Programmer, systems, Records, Test Results / Process Orders '[]'  - 0 Staff telephones HHA, Nursing Homes / Clarify orders / etc '[]'  - 0 Routine Transfer to another Facility (non-emergent condition) '[]'  - 0 Routine Hospital Admission (non-emergent condition) '[]'  - 0 New Admissions / Biomedical engineer / Ordering NPWT, Apligraf, etc. '[]'  - 0 Emergency Hospital Admission (emergent condition) X- 1 10 Simple Discharge Coordination '[]'  - 0 Complex (extensive) Discharge Coordination PROCESS - Special Needs '[]'  - Pediatric / Minor Patient Management 0 '[]'  - 0 Isolation Patient Management '[]'  - 0 Hearing / Language / Visual special needs '[]'  - 0 Assessment of Community assistance (transportation, D/C planning, etc.) '[]'  - 0 Additional assistance / Altered mentation '[]'  - 0 Support Surface(s) Assessment (bed, cushion, seat, etc.) INTERVENTIONS - Wound Cleansing / Measurement TIFFANNI, SCARFO. ([id number]) X- 1 5 Simple Wound Cleansing - one wound '[]'  - 0 Complex Wound Cleansing - multiple wounds X- 1 5 Wound Imaging (photographs - any number of wounds) '[]'   - 0 Wound Tracing (instead of photographs) X- 1 5 Simple Wound Measurement - one wound '[]'  - 0 Complex Wound Measurement - multiple  wounds INTERVENTIONS - Wound Dressings X - Small Wound Dressing one or multiple wounds 1 10 '[]'  - 0 Medium Wound Dressing one or multiple wounds '[]'  - 0 Large Wound Dressing one or multiple wounds X- 1 5 Application of Medications - topical '[]'  - 0 Application of Medications - injection INTERVENTIONS - Miscellaneous '[]'  - External ear exam 0 '[]'  - 0 Specimen Collection (cultures, biopsies, blood, body fluids, etc.) '[]'  - 0 Specimen(s) / Culture(s) sent or taken to Lab for analysis '[]'  - 0 Patient Transfer (multiple staff / Civil Service fast streamer / Similar devices) '[]'  - 0 Simple Staple / Suture removal (25 or less) '[]'  - 0 Complex Staple / Suture removal (26 or more) '[]'  - 0 Hypo / Hyperglycemic Management (close monitor of Blood Glucose) '[]'  - 0 Ankle / Brachial Index (ABI) - do not check if billed separately X- 1 5 Vital Signs Has the patient been seen at the hospital within the last three years: Yes Total Score: 80 Level Of Care: New/Established - Level 3 Electronic Signature(s) Signed: 03/20/2021 8:33:59 AM By: Carlene Coria RN Entered By: Carlene Coria on 03/10/2021 10:17:15 Angel Reynolds ([id number]) -------------------------------------------------------------------------------- Encounter Discharge Information Details Patient Name: Angel Reynolds. Date of Service: 03/10/2021 10:00 AM Medical Record Number: [id number] Patient Account Number: [id number] Date of Birth/Sex: November 24, 1955 (64 y.o. F) Treating RN: Donnamarie Poag Primary Care Santhosh Gulino: Cher Nakai Other Clinician: Jeanine Luz Referring Deneise Getty: Cher Nakai Treating Kanasia Gayman/Extender: Skipper Cliche in Treatment: 6 Encounter Discharge Information Items Discharge Condition: Stable Ambulatory Status: Ambulatory Discharge Destination: Home Transportation: Private Auto Accompanied By:  husband Schedule Follow-up Appointment: Yes Clinical Summary of Care: Electronic Signature(s) Signed: 03/10/2021 4:26:14 PM By: Donnamarie Poag Entered By: Donnamarie Poag on 03/10/2021 10:23:13 Angel Reynolds ([id number]) -------------------------------------------------------------------------------- Lower Extremity Assessment Details Patient Name: Angel Reynolds. Date of Service: 03/10/2021 10:00 AM Medical Record Number: [id number] Patient Account Number: [id number] Date of Birth/Sex: February 14, 1956 (64 y.o. F) Treating RN: Carlene Coria Primary Care Ariza Evans: Cher Nakai Other Clinician: Jeanine Luz Referring Marylynn Rigdon: Cher Nakai Treating Alanta Scobey/Extender: Skipper Cliche in Treatment: 6 Electronic Signature(s) Signed: 03/10/2021 3:59:56 PM By: Jeanine Luz Signed: 03/20/2021 8:33:59 AM By: Carlene Coria RN Entered By: Jeanine Luz on 03/10/2021 10:10:10 Angel Reynolds ([id number]) -------------------------------------------------------------------------------- Multi Wound Chart Details Patient Name: Angel Reynolds. Date of Service: 03/10/2021 10:00 AM Medical Record Number: [id number] Patient Account Number: [id number] Date of Birth/Sex: [birth year] (64 y.o. F) Treating RN: Carlene Coria Primary Care Yishai Rehfeld: Cher Nakai Other Clinician: Jeanine Luz Referring Cherylann Hobday: Cher Nakai Treating Jakyria Bleau/Extender: Skipper Cliche in Treatment: 6 Vital Signs Height(in): 62 Pulse(bpm): 80 Weight(lbs): 177 Blood Pressure(mmHg): 151/74 Body Mass Index(BMI): 32 Temperature(F): 98.2 Respiratory Rate(breaths/min): 18 Photos: [N/A:N/A] Wound Location: Right Breast N/A N/A Wounding Event: Gradually Appeared N/A N/A Primary Etiology: Abscess N/A N/A Comorbid History: Hypertension, Type II Diabetes, N/A N/A Osteoarthritis Date Acquired: 12/30/2020 N/A N/A Weeks of Treatment: 6 N/A N/A Wound Status: Open N/A N/A Measurements L x W x D (cm) 0.6x0.3x0.1 N/A N/A Area (cm) : 0.141  N/A N/A Volume (cm) : 0.014 N/A N/A % Reduction in Area: 96.00% N/A N/A % Reduction in Volume: 99.30% N/A N/A Classification: Full Thickness Without Exposed N/A N/A Support Structures Exudate Amount: Small N/A N/A Exudate Type: Sanguinous N/A N/A Exudate Color: red N/A N/A Granulation Amount: Large (67-100%) N/A N/A Granulation Quality: Red, Pink N/A N/A Necrotic Amount: None Present (0%) N/A N/A Exposed Structures: Fat Layer (Subcutaneous Tissue): N/A N/A Yes Fascia: No Tendon: No Muscle: No  wounds INTERVENTIONS - Wound Dressings X - Small Wound Dressing one or multiple wounds 1 10 '[]'  - 0 Medium Wound Dressing one or multiple wounds '[]'  - 0 Large Wound Dressing one or multiple wounds X- 1 5 Application of Medications - topical '[]'  - 0 Application of Medications - injection INTERVENTIONS - Miscellaneous '[]'  - External ear exam 0 '[]'  - 0 Specimen Collection (cultures, biopsies, blood, body fluids, etc.) '[]'  - 0 Specimen(s) / Culture(s) sent or taken to Lab for analysis '[]'  - 0 Patient Transfer (multiple staff / Civil Service fast streamer / Similar devices) '[]'  - 0 Simple Staple / Suture removal (25 or less) '[]'  - 0 Complex Staple / Suture removal (26 or more) '[]'  - 0 Hypo / Hyperglycemic Management (close monitor of Blood Glucose) '[]'  - 0 Ankle / Brachial Index (ABI) - do not check if billed separately X- 1 5 Vital Signs Has the patient been seen at the hospital within the last three years: Yes Total Score: 80 Level Of Care: New/Established - Level 3 Electronic Signature(s) Signed: 03/20/2021 8:33:59 AM By: Carlene Coria RN Entered By: Carlene Coria on 03/10/2021 10:17:15 Angel Reynolds ([id number]) -------------------------------------------------------------------------------- Encounter Discharge Information Details Patient Name: Angel Reynolds. Date of Service: 03/10/2021 10:00 AM Medical Record Number: [id number] Patient Account Number: [id number] Date of Birth/Sex: November 24, 1955 (64 y.o. F) Treating RN: Donnamarie Poag Primary Care Santhosh Gulino: Cher Nakai Other Clinician: Jeanine Luz Referring Deneise Getty: Cher Nakai Treating Kanasia Gayman/Extender: Skipper Cliche in Treatment: 6 Encounter Discharge Information Items Discharge Condition: Stable Ambulatory Status: Ambulatory Discharge Destination: Home Transportation: Private Auto Accompanied By:  husband Schedule Follow-up Appointment: Yes Clinical Summary of Care: Electronic Signature(s) Signed: 03/10/2021 4:26:14 PM By: Donnamarie Poag Entered By: Donnamarie Poag on 03/10/2021 10:23:13 Angel Reynolds ([id number]) -------------------------------------------------------------------------------- Lower Extremity Assessment Details Patient Name: Angel Reynolds. Date of Service: 03/10/2021 10:00 AM Medical Record Number: [id number] Patient Account Number: [id number] Date of Birth/Sex: February 14, 1956 (64 y.o. F) Treating RN: Carlene Coria Primary Care Ariza Evans: Cher Nakai Other Clinician: Jeanine Luz Referring Marylynn Rigdon: Cher Nakai Treating Alanta Scobey/Extender: Skipper Cliche in Treatment: 6 Electronic Signature(s) Signed: 03/10/2021 3:59:56 PM By: Jeanine Luz Signed: 03/20/2021 8:33:59 AM By: Carlene Coria RN Entered By: Jeanine Luz on 03/10/2021 10:10:10 Angel Reynolds ([id number]) -------------------------------------------------------------------------------- Multi Wound Chart Details Patient Name: Angel Reynolds. Date of Service: 03/10/2021 10:00 AM Medical Record Number: [id number] Patient Account Number: [id number] Date of Birth/Sex: [birth year] (64 y.o. F) Treating RN: Carlene Coria Primary Care Yishai Rehfeld: Cher Nakai Other Clinician: Jeanine Luz Referring Cherylann Hobday: Cher Nakai Treating Jakyria Bleau/Extender: Skipper Cliche in Treatment: 6 Vital Signs Height(in): 62 Pulse(bpm): 80 Weight(lbs): 177 Blood Pressure(mmHg): 151/74 Body Mass Index(BMI): 32 Temperature(F): 98.2 Respiratory Rate(breaths/min): 18 Photos: [N/A:N/A] Wound Location: Right Breast N/A N/A Wounding Event: Gradually Appeared N/A N/A Primary Etiology: Abscess N/A N/A Comorbid History: Hypertension, Type II Diabetes, N/A N/A Osteoarthritis Date Acquired: 12/30/2020 N/A N/A Weeks of Treatment: 6 N/A N/A Wound Status: Open N/A N/A Measurements L x W x D (cm) 0.6x0.3x0.1 N/A N/A Area (cm) : 0.141  N/A N/A Volume (cm) : 0.014 N/A N/A % Reduction in Area: 96.00% N/A N/A % Reduction in Volume: 99.30% N/A N/A Classification: Full Thickness Without Exposed N/A N/A Support Structures Exudate Amount: Small N/A N/A Exudate Type: Sanguinous N/A N/A Exudate Color: red N/A N/A Granulation Amount: Large (67-100%) N/A N/A Granulation Quality: Red, Pink N/A N/A Necrotic Amount: None Present (0%) N/A N/A Exposed Structures: Fat Layer (Subcutaneous Tissue): N/A N/A Yes Fascia: No Tendon: No Muscle: No

## 2021-03-22 DIAGNOSIS — K5909 Other constipation: Secondary | ICD-10-CM | POA: Diagnosis not present

## 2021-03-22 DIAGNOSIS — G629 Polyneuropathy, unspecified: Secondary | ICD-10-CM | POA: Diagnosis not present

## 2021-03-22 DIAGNOSIS — I10 Essential (primary) hypertension: Secondary | ICD-10-CM | POA: Diagnosis not present

## 2021-03-22 DIAGNOSIS — F419 Anxiety disorder, unspecified: Secondary | ICD-10-CM | POA: Diagnosis not present

## 2021-03-22 DIAGNOSIS — M25511 Pain in right shoulder: Secondary | ICD-10-CM | POA: Diagnosis not present

## 2021-03-22 DIAGNOSIS — Z87891 Personal history of nicotine dependence: Secondary | ICD-10-CM | POA: Diagnosis not present

## 2021-03-22 DIAGNOSIS — R748 Abnormal levels of other serum enzymes: Secondary | ICD-10-CM | POA: Diagnosis not present

## 2021-03-22 DIAGNOSIS — B359 Dermatophytosis, unspecified: Secondary | ICD-10-CM | POA: Diagnosis not present

## 2021-03-22 DIAGNOSIS — F3341 Major depressive disorder, recurrent, in partial remission: Secondary | ICD-10-CM | POA: Diagnosis not present

## 2021-03-24 ENCOUNTER — Encounter: Payer: Medicare HMO | Admitting: Physician Assistant

## 2021-03-31 ENCOUNTER — Encounter: Payer: Medicare HMO | Admitting: Internal Medicine

## 2021-04-19 DIAGNOSIS — I10 Essential (primary) hypertension: Secondary | ICD-10-CM | POA: Diagnosis not present

## 2021-04-19 DIAGNOSIS — E1142 Type 2 diabetes mellitus with diabetic polyneuropathy: Secondary | ICD-10-CM | POA: Diagnosis not present

## 2021-04-19 DIAGNOSIS — E1169 Type 2 diabetes mellitus with other specified complication: Secondary | ICD-10-CM | POA: Diagnosis not present

## 2021-04-19 DIAGNOSIS — F419 Anxiety disorder, unspecified: Secondary | ICD-10-CM | POA: Diagnosis not present

## 2021-04-19 DIAGNOSIS — R748 Abnormal levels of other serum enzymes: Secondary | ICD-10-CM | POA: Diagnosis not present

## 2021-04-19 DIAGNOSIS — E7800 Pure hypercholesterolemia, unspecified: Secondary | ICD-10-CM | POA: Diagnosis not present

## 2021-04-19 DIAGNOSIS — F3341 Major depressive disorder, recurrent, in partial remission: Secondary | ICD-10-CM | POA: Diagnosis not present

## 2021-04-19 DIAGNOSIS — E669 Obesity, unspecified: Secondary | ICD-10-CM | POA: Diagnosis not present

## 2021-04-19 DIAGNOSIS — B359 Dermatophytosis, unspecified: Secondary | ICD-10-CM | POA: Diagnosis not present

## 2021-04-19 DIAGNOSIS — K5909 Other constipation: Secondary | ICD-10-CM | POA: Diagnosis not present

## 2021-04-19 DIAGNOSIS — G629 Polyneuropathy, unspecified: Secondary | ICD-10-CM | POA: Diagnosis not present

## 2021-04-19 DIAGNOSIS — Z87891 Personal history of nicotine dependence: Secondary | ICD-10-CM | POA: Diagnosis not present

## 2021-05-17 DIAGNOSIS — F3341 Major depressive disorder, recurrent, in partial remission: Secondary | ICD-10-CM | POA: Diagnosis not present

## 2021-05-17 DIAGNOSIS — G629 Polyneuropathy, unspecified: Secondary | ICD-10-CM | POA: Diagnosis not present

## 2021-05-17 DIAGNOSIS — M545 Low back pain, unspecified: Secondary | ICD-10-CM | POA: Diagnosis not present

## 2021-05-17 DIAGNOSIS — K5909 Other constipation: Secondary | ICD-10-CM | POA: Diagnosis not present

## 2021-05-17 DIAGNOSIS — R748 Abnormal levels of other serum enzymes: Secondary | ICD-10-CM | POA: Diagnosis not present

## 2021-05-17 DIAGNOSIS — I10 Essential (primary) hypertension: Secondary | ICD-10-CM | POA: Diagnosis not present

## 2021-05-17 DIAGNOSIS — B359 Dermatophytosis, unspecified: Secondary | ICD-10-CM | POA: Diagnosis not present

## 2021-05-17 DIAGNOSIS — E7800 Pure hypercholesterolemia, unspecified: Secondary | ICD-10-CM | POA: Diagnosis not present

## 2021-05-17 DIAGNOSIS — Z87891 Personal history of nicotine dependence: Secondary | ICD-10-CM | POA: Diagnosis not present

## 2021-06-14 DIAGNOSIS — M545 Low back pain, unspecified: Secondary | ICD-10-CM | POA: Diagnosis not present

## 2021-06-14 DIAGNOSIS — K5909 Other constipation: Secondary | ICD-10-CM | POA: Diagnosis not present

## 2021-06-14 DIAGNOSIS — E7800 Pure hypercholesterolemia, unspecified: Secondary | ICD-10-CM | POA: Diagnosis not present

## 2021-06-14 DIAGNOSIS — G629 Polyneuropathy, unspecified: Secondary | ICD-10-CM | POA: Diagnosis not present

## 2021-06-14 DIAGNOSIS — B359 Dermatophytosis, unspecified: Secondary | ICD-10-CM | POA: Diagnosis not present

## 2021-06-14 DIAGNOSIS — Z87891 Personal history of nicotine dependence: Secondary | ICD-10-CM | POA: Diagnosis not present

## 2021-06-14 DIAGNOSIS — F3341 Major depressive disorder, recurrent, in partial remission: Secondary | ICD-10-CM | POA: Diagnosis not present

## 2021-06-14 DIAGNOSIS — I10 Essential (primary) hypertension: Secondary | ICD-10-CM | POA: Diagnosis not present

## 2021-06-14 DIAGNOSIS — R748 Abnormal levels of other serum enzymes: Secondary | ICD-10-CM | POA: Diagnosis not present

## 2021-06-22 DIAGNOSIS — Z1231 Encounter for screening mammogram for malignant neoplasm of breast: Secondary | ICD-10-CM | POA: Diagnosis not present

## 2021-06-28 DIAGNOSIS — M25552 Pain in left hip: Secondary | ICD-10-CM | POA: Diagnosis not present

## 2021-06-28 DIAGNOSIS — E1169 Type 2 diabetes mellitus with other specified complication: Secondary | ICD-10-CM | POA: Diagnosis not present

## 2021-06-28 DIAGNOSIS — F419 Anxiety disorder, unspecified: Secondary | ICD-10-CM | POA: Diagnosis not present

## 2021-06-28 DIAGNOSIS — R748 Abnormal levels of other serum enzymes: Secondary | ICD-10-CM | POA: Diagnosis not present

## 2021-06-28 DIAGNOSIS — F3341 Major depressive disorder, recurrent, in partial remission: Secondary | ICD-10-CM | POA: Diagnosis not present

## 2021-06-28 DIAGNOSIS — I10 Essential (primary) hypertension: Secondary | ICD-10-CM | POA: Diagnosis not present

## 2021-06-28 DIAGNOSIS — K5909 Other constipation: Secondary | ICD-10-CM | POA: Diagnosis not present

## 2021-06-28 DIAGNOSIS — E669 Obesity, unspecified: Secondary | ICD-10-CM | POA: Diagnosis not present

## 2021-06-28 DIAGNOSIS — G629 Polyneuropathy, unspecified: Secondary | ICD-10-CM | POA: Diagnosis not present

## 2021-07-12 DIAGNOSIS — R748 Abnormal levels of other serum enzymes: Secondary | ICD-10-CM | POA: Diagnosis not present

## 2021-07-12 DIAGNOSIS — G629 Polyneuropathy, unspecified: Secondary | ICD-10-CM | POA: Diagnosis not present

## 2021-07-12 DIAGNOSIS — I10 Essential (primary) hypertension: Secondary | ICD-10-CM | POA: Diagnosis not present

## 2021-07-12 DIAGNOSIS — F3341 Major depressive disorder, recurrent, in partial remission: Secondary | ICD-10-CM | POA: Diagnosis not present

## 2021-07-12 DIAGNOSIS — E669 Obesity, unspecified: Secondary | ICD-10-CM | POA: Diagnosis not present

## 2021-07-12 DIAGNOSIS — E1169 Type 2 diabetes mellitus with other specified complication: Secondary | ICD-10-CM | POA: Diagnosis not present

## 2021-07-12 DIAGNOSIS — K5909 Other constipation: Secondary | ICD-10-CM | POA: Diagnosis not present

## 2021-07-12 DIAGNOSIS — M25552 Pain in left hip: Secondary | ICD-10-CM | POA: Diagnosis not present

## 2021-07-12 DIAGNOSIS — M545 Low back pain, unspecified: Secondary | ICD-10-CM | POA: Diagnosis not present

## 2021-07-26 DIAGNOSIS — E1169 Type 2 diabetes mellitus with other specified complication: Secondary | ICD-10-CM | POA: Diagnosis not present

## 2021-07-26 DIAGNOSIS — E7800 Pure hypercholesterolemia, unspecified: Secondary | ICD-10-CM | POA: Diagnosis not present

## 2021-07-26 DIAGNOSIS — F419 Anxiety disorder, unspecified: Secondary | ICD-10-CM | POA: Diagnosis not present

## 2021-07-26 DIAGNOSIS — G629 Polyneuropathy, unspecified: Secondary | ICD-10-CM | POA: Diagnosis not present

## 2021-07-26 DIAGNOSIS — E669 Obesity, unspecified: Secondary | ICD-10-CM | POA: Diagnosis not present

## 2021-07-26 DIAGNOSIS — R748 Abnormal levels of other serum enzymes: Secondary | ICD-10-CM | POA: Diagnosis not present

## 2021-07-26 DIAGNOSIS — M25551 Pain in right hip: Secondary | ICD-10-CM | POA: Diagnosis not present

## 2021-07-26 DIAGNOSIS — K5909 Other constipation: Secondary | ICD-10-CM | POA: Diagnosis not present

## 2021-07-26 DIAGNOSIS — F3341 Major depressive disorder, recurrent, in partial remission: Secondary | ICD-10-CM | POA: Diagnosis not present

## 2021-07-26 DIAGNOSIS — I10 Essential (primary) hypertension: Secondary | ICD-10-CM | POA: Diagnosis not present

## 2021-08-10 DIAGNOSIS — Z23 Encounter for immunization: Secondary | ICD-10-CM | POA: Diagnosis not present

## 2021-08-10 DIAGNOSIS — R748 Abnormal levels of other serum enzymes: Secondary | ICD-10-CM | POA: Diagnosis not present

## 2021-08-10 DIAGNOSIS — I10 Essential (primary) hypertension: Secondary | ICD-10-CM | POA: Diagnosis not present

## 2021-08-10 DIAGNOSIS — E7800 Pure hypercholesterolemia, unspecified: Secondary | ICD-10-CM | POA: Diagnosis not present

## 2021-08-10 DIAGNOSIS — F3341 Major depressive disorder, recurrent, in partial remission: Secondary | ICD-10-CM | POA: Diagnosis not present

## 2021-08-10 DIAGNOSIS — M545 Low back pain, unspecified: Secondary | ICD-10-CM | POA: Diagnosis not present

## 2021-08-10 DIAGNOSIS — M25551 Pain in right hip: Secondary | ICD-10-CM | POA: Diagnosis not present

## 2021-08-10 DIAGNOSIS — K5909 Other constipation: Secondary | ICD-10-CM | POA: Diagnosis not present

## 2021-08-10 DIAGNOSIS — G629 Polyneuropathy, unspecified: Secondary | ICD-10-CM | POA: Diagnosis not present

## 2021-08-15 DIAGNOSIS — Z9181 History of falling: Secondary | ICD-10-CM | POA: Diagnosis not present

## 2021-08-15 DIAGNOSIS — E669 Obesity, unspecified: Secondary | ICD-10-CM | POA: Diagnosis not present

## 2021-08-15 DIAGNOSIS — Z6831 Body mass index (BMI) 31.0-31.9, adult: Secondary | ICD-10-CM | POA: Diagnosis not present

## 2021-08-15 DIAGNOSIS — Z0000 Encounter for general adult medical examination without abnormal findings: Secondary | ICD-10-CM | POA: Diagnosis not present

## 2021-08-15 DIAGNOSIS — E785 Hyperlipidemia, unspecified: Secondary | ICD-10-CM | POA: Diagnosis not present

## 2021-08-15 DIAGNOSIS — Z1331 Encounter for screening for depression: Secondary | ICD-10-CM | POA: Diagnosis not present

## 2021-08-31 DIAGNOSIS — E7800 Pure hypercholesterolemia, unspecified: Secondary | ICD-10-CM | POA: Diagnosis not present

## 2021-08-31 DIAGNOSIS — F3341 Major depressive disorder, recurrent, in partial remission: Secondary | ICD-10-CM | POA: Diagnosis not present

## 2021-08-31 DIAGNOSIS — I10 Essential (primary) hypertension: Secondary | ICD-10-CM | POA: Diagnosis not present

## 2021-08-31 DIAGNOSIS — M545 Low back pain, unspecified: Secondary | ICD-10-CM | POA: Diagnosis not present

## 2021-08-31 DIAGNOSIS — M25551 Pain in right hip: Secondary | ICD-10-CM | POA: Diagnosis not present

## 2021-08-31 DIAGNOSIS — G629 Polyneuropathy, unspecified: Secondary | ICD-10-CM | POA: Diagnosis not present

## 2021-08-31 DIAGNOSIS — K5909 Other constipation: Secondary | ICD-10-CM | POA: Diagnosis not present

## 2021-08-31 DIAGNOSIS — R748 Abnormal levels of other serum enzymes: Secondary | ICD-10-CM | POA: Diagnosis not present

## 2021-08-31 DIAGNOSIS — F419 Anxiety disorder, unspecified: Secondary | ICD-10-CM | POA: Diagnosis not present

## 2021-09-06 DIAGNOSIS — R748 Abnormal levels of other serum enzymes: Secondary | ICD-10-CM | POA: Diagnosis not present

## 2021-09-06 DIAGNOSIS — M545 Low back pain, unspecified: Secondary | ICD-10-CM | POA: Diagnosis not present

## 2021-09-06 DIAGNOSIS — E7800 Pure hypercholesterolemia, unspecified: Secondary | ICD-10-CM | POA: Diagnosis not present

## 2021-09-06 DIAGNOSIS — K5909 Other constipation: Secondary | ICD-10-CM | POA: Diagnosis not present

## 2021-09-06 DIAGNOSIS — I10 Essential (primary) hypertension: Secondary | ICD-10-CM | POA: Diagnosis not present

## 2021-09-06 DIAGNOSIS — F3341 Major depressive disorder, recurrent, in partial remission: Secondary | ICD-10-CM | POA: Diagnosis not present

## 2021-09-06 DIAGNOSIS — F419 Anxiety disorder, unspecified: Secondary | ICD-10-CM | POA: Diagnosis not present

## 2021-09-06 DIAGNOSIS — G629 Polyneuropathy, unspecified: Secondary | ICD-10-CM | POA: Diagnosis not present

## 2021-09-06 DIAGNOSIS — F17200 Nicotine dependence, unspecified, uncomplicated: Secondary | ICD-10-CM | POA: Diagnosis not present

## 2021-10-04 DIAGNOSIS — I10 Essential (primary) hypertension: Secondary | ICD-10-CM | POA: Diagnosis not present

## 2021-10-04 DIAGNOSIS — M545 Low back pain, unspecified: Secondary | ICD-10-CM | POA: Diagnosis not present

## 2021-10-04 DIAGNOSIS — F419 Anxiety disorder, unspecified: Secondary | ICD-10-CM | POA: Diagnosis not present

## 2021-10-04 DIAGNOSIS — K5909 Other constipation: Secondary | ICD-10-CM | POA: Diagnosis not present

## 2021-10-04 DIAGNOSIS — F17200 Nicotine dependence, unspecified, uncomplicated: Secondary | ICD-10-CM | POA: Diagnosis not present

## 2021-10-04 DIAGNOSIS — R748 Abnormal levels of other serum enzymes: Secondary | ICD-10-CM | POA: Diagnosis not present

## 2021-10-04 DIAGNOSIS — G629 Polyneuropathy, unspecified: Secondary | ICD-10-CM | POA: Diagnosis not present

## 2021-10-04 DIAGNOSIS — F3341 Major depressive disorder, recurrent, in partial remission: Secondary | ICD-10-CM | POA: Diagnosis not present

## 2021-10-04 DIAGNOSIS — E7800 Pure hypercholesterolemia, unspecified: Secondary | ICD-10-CM | POA: Diagnosis not present

## 2021-11-03 DIAGNOSIS — I10 Essential (primary) hypertension: Secondary | ICD-10-CM | POA: Diagnosis not present

## 2021-11-03 DIAGNOSIS — E669 Obesity, unspecified: Secondary | ICD-10-CM | POA: Diagnosis not present

## 2021-11-03 DIAGNOSIS — E7800 Pure hypercholesterolemia, unspecified: Secondary | ICD-10-CM | POA: Diagnosis not present

## 2021-11-03 DIAGNOSIS — G629 Polyneuropathy, unspecified: Secondary | ICD-10-CM | POA: Diagnosis not present

## 2021-11-03 DIAGNOSIS — M545 Low back pain, unspecified: Secondary | ICD-10-CM | POA: Diagnosis not present

## 2021-11-03 DIAGNOSIS — R748 Abnormal levels of other serum enzymes: Secondary | ICD-10-CM | POA: Diagnosis not present

## 2021-11-03 DIAGNOSIS — F3341 Major depressive disorder, recurrent, in partial remission: Secondary | ICD-10-CM | POA: Diagnosis not present

## 2021-11-03 DIAGNOSIS — E1169 Type 2 diabetes mellitus with other specified complication: Secondary | ICD-10-CM | POA: Diagnosis not present

## 2021-11-03 DIAGNOSIS — K5909 Other constipation: Secondary | ICD-10-CM | POA: Diagnosis not present

## 2021-12-06 DIAGNOSIS — M545 Low back pain, unspecified: Secondary | ICD-10-CM | POA: Diagnosis not present

## 2021-12-06 DIAGNOSIS — F3341 Major depressive disorder, recurrent, in partial remission: Secondary | ICD-10-CM | POA: Diagnosis not present

## 2021-12-06 DIAGNOSIS — R748 Abnormal levels of other serum enzymes: Secondary | ICD-10-CM | POA: Diagnosis not present

## 2021-12-06 DIAGNOSIS — E1169 Type 2 diabetes mellitus with other specified complication: Secondary | ICD-10-CM | POA: Diagnosis not present

## 2021-12-06 DIAGNOSIS — E7800 Pure hypercholesterolemia, unspecified: Secondary | ICD-10-CM | POA: Diagnosis not present

## 2021-12-06 DIAGNOSIS — G629 Polyneuropathy, unspecified: Secondary | ICD-10-CM | POA: Diagnosis not present

## 2021-12-06 DIAGNOSIS — K5909 Other constipation: Secondary | ICD-10-CM | POA: Diagnosis not present

## 2021-12-06 DIAGNOSIS — E669 Obesity, unspecified: Secondary | ICD-10-CM | POA: Diagnosis not present

## 2021-12-06 DIAGNOSIS — I10 Essential (primary) hypertension: Secondary | ICD-10-CM | POA: Diagnosis not present

## 2021-12-09 IMAGING — DX DG KNEE COMPLETE 4+V*L*
4 series · 4 of 4 positions shown · non-contrast
Comparison: None.

CLINICAL DATA: Pain

EXAM:
LEFT KNEE - COMPLETE 4+ VIEW

[knee ap]
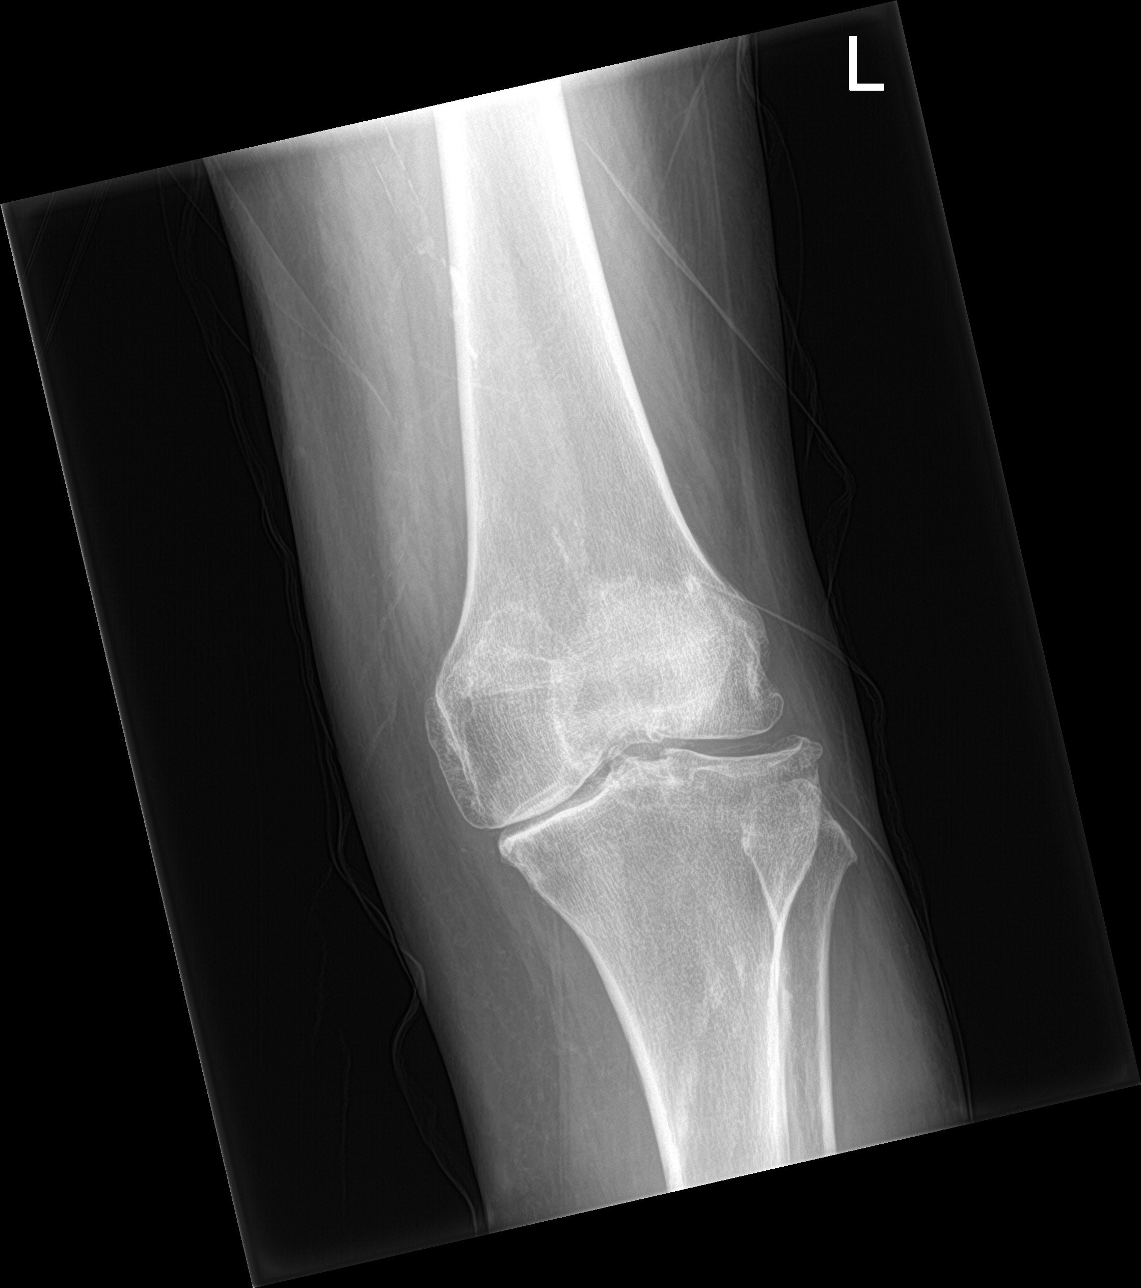

[knee lat]
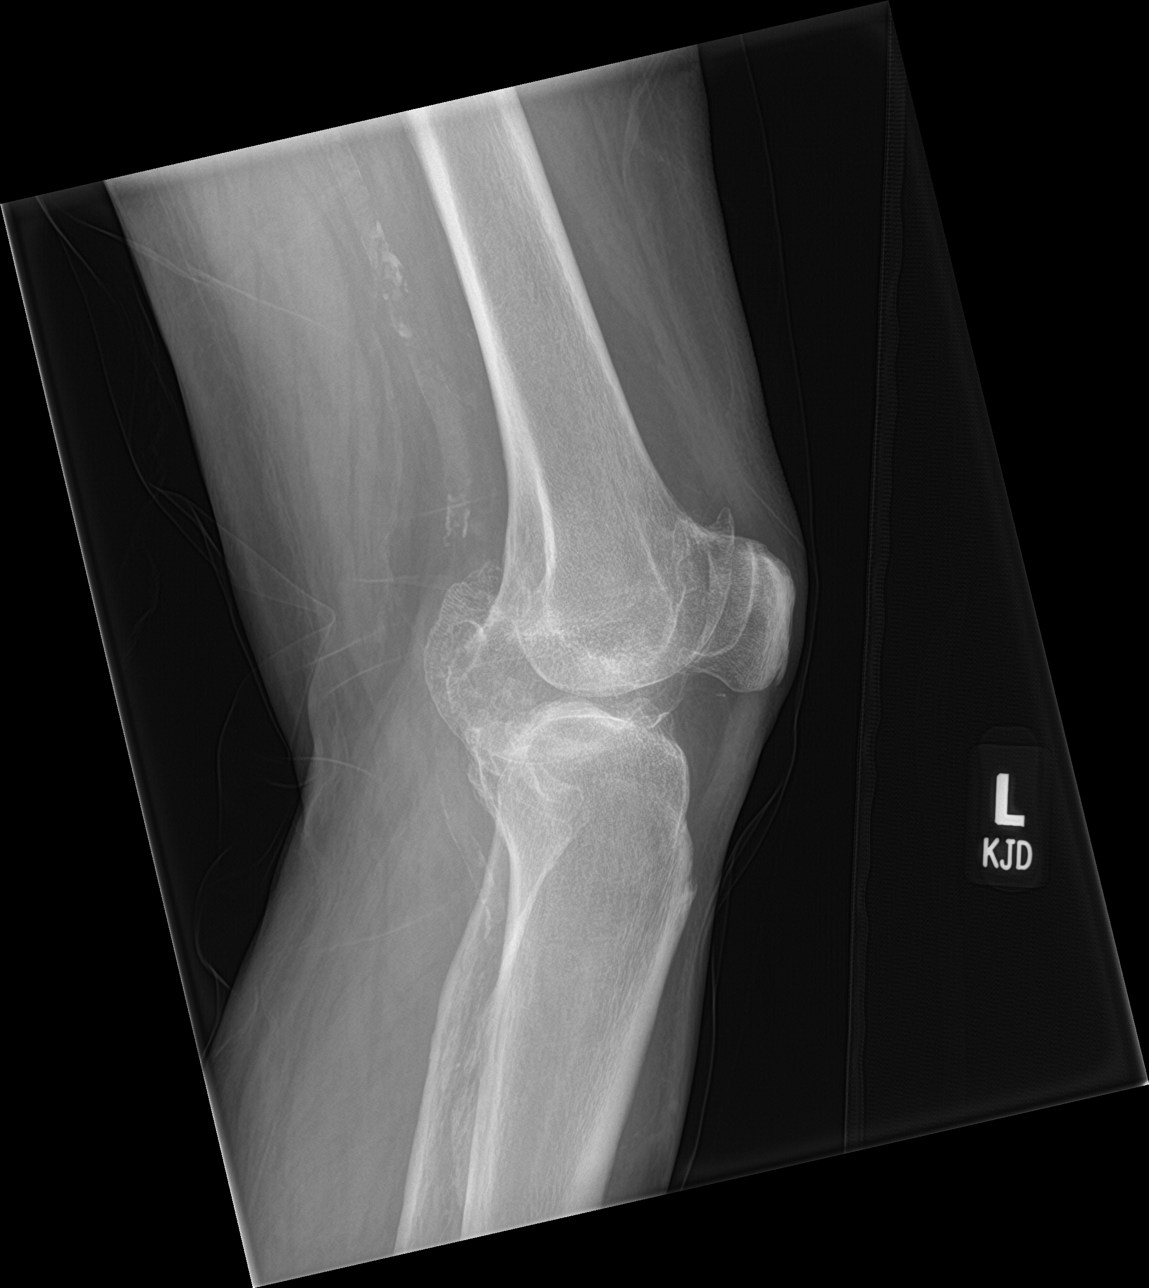

[knee obl (1 of 2)]
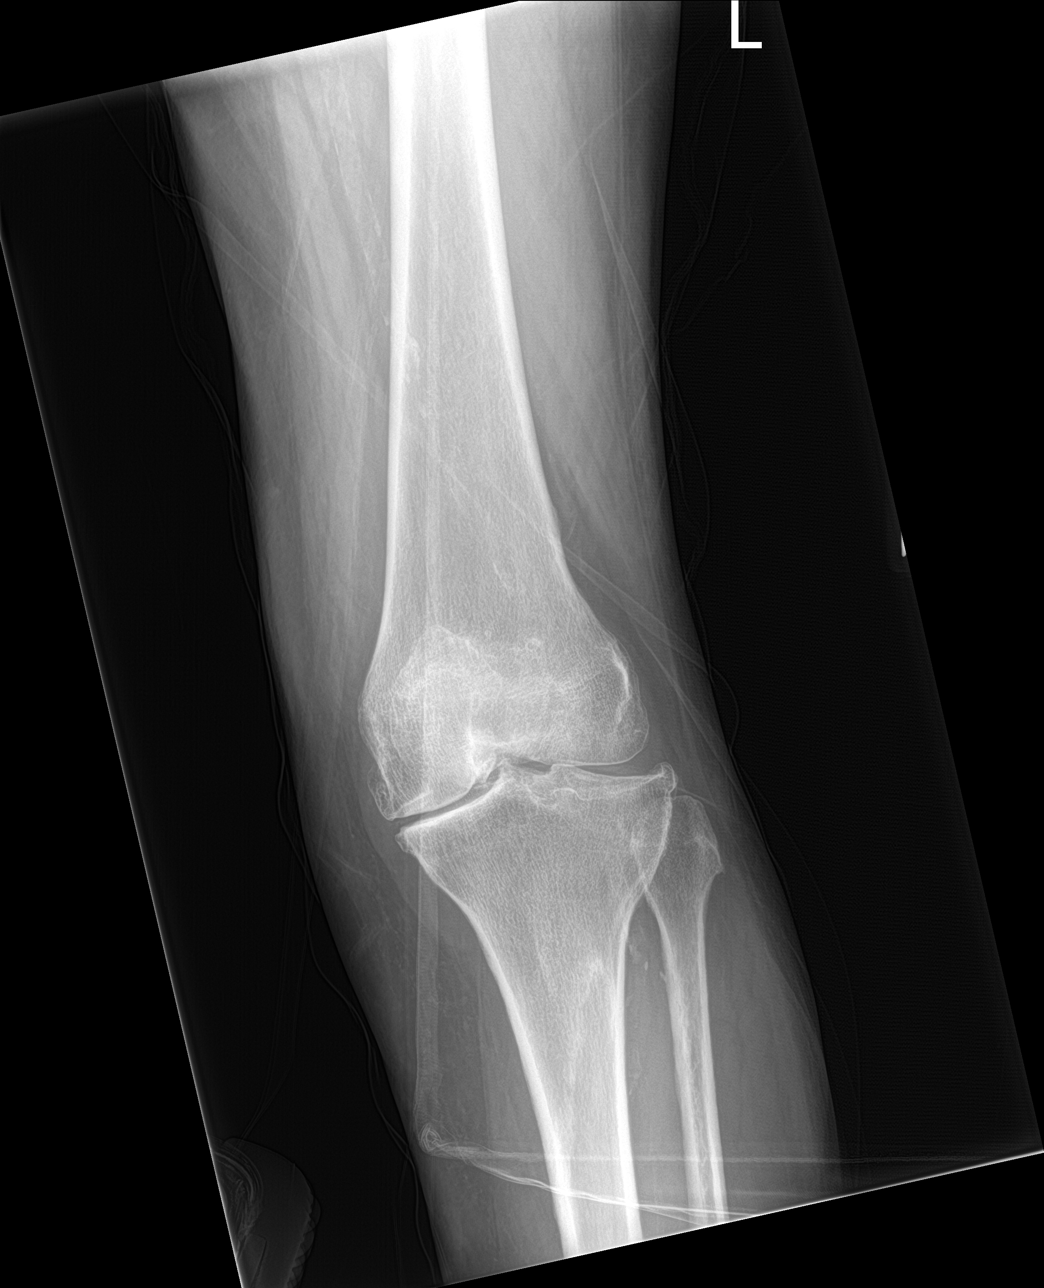

[knee obl (2 of 2)]
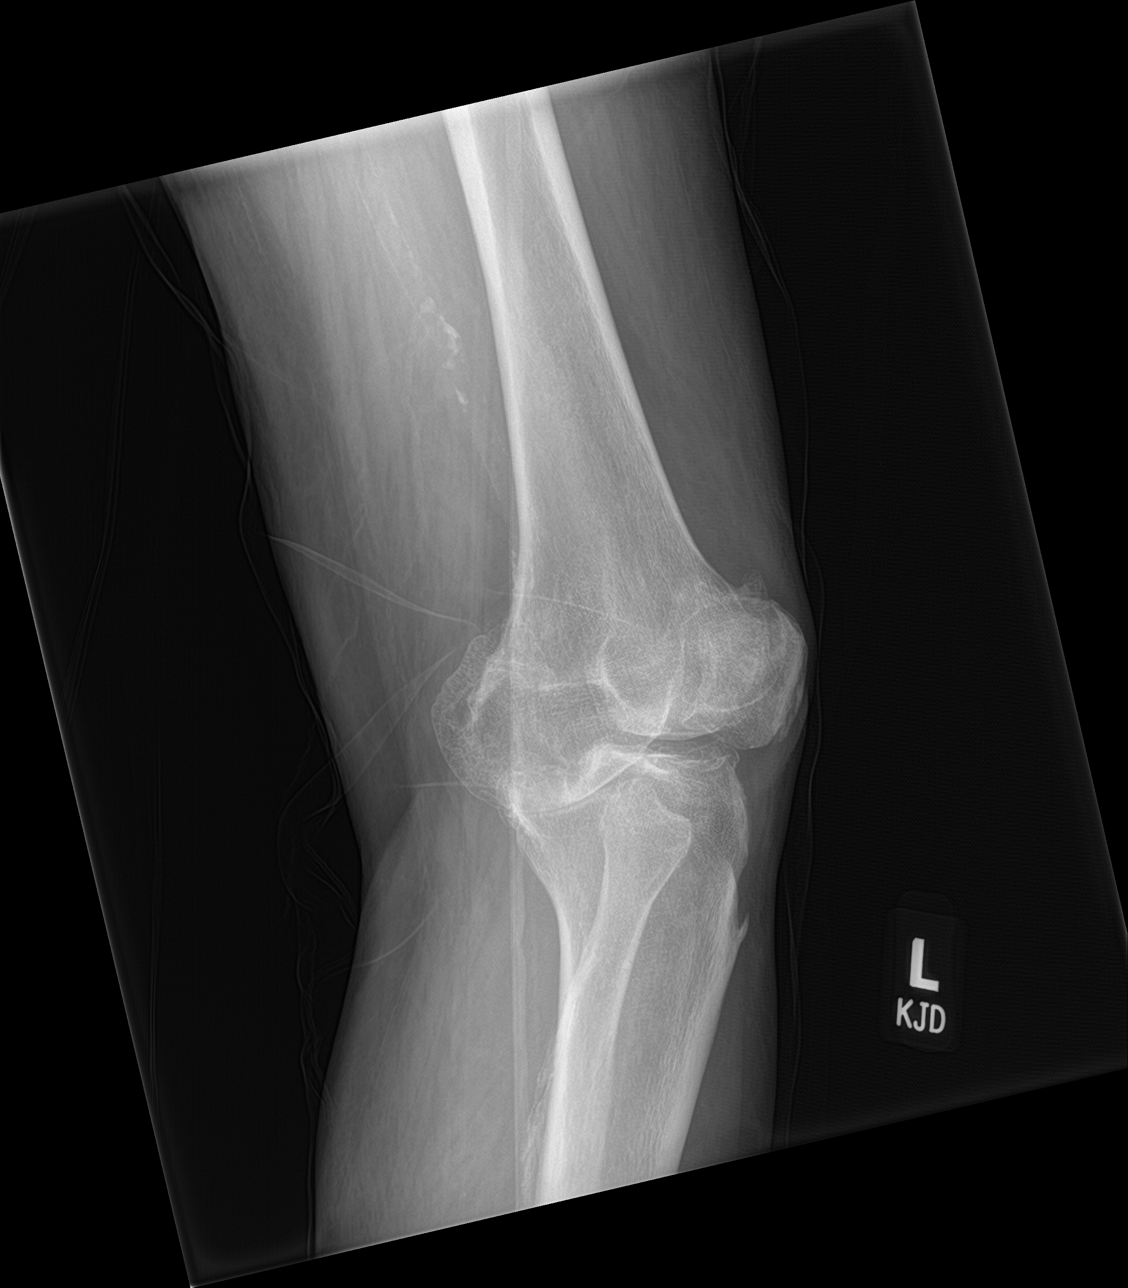

[4 of 4 positions shown; findings below may reference images not displayed]

FINDINGS: No evidence of fracture, dislocation, or joint effusion.
Tricompartmental osteoarthritis is seen advanced within the medial
compartment joint space loss and marginal osteophyte formation.
Scattered vascular calcifications are noted. Soft tissues are
unremarkable.
IMPRESSION: No acute osseous abnormality

Tricompartmental osteoarthritis, advanced within the medial
compartment.

## 2022-01-03 DIAGNOSIS — E1169 Type 2 diabetes mellitus with other specified complication: Secondary | ICD-10-CM | POA: Diagnosis not present

## 2022-01-03 DIAGNOSIS — R079 Chest pain, unspecified: Secondary | ICD-10-CM | POA: Diagnosis not present

## 2022-01-03 DIAGNOSIS — Z139 Encounter for screening, unspecified: Secondary | ICD-10-CM | POA: Diagnosis not present

## 2022-01-03 DIAGNOSIS — F3341 Major depressive disorder, recurrent, in partial remission: Secondary | ICD-10-CM | POA: Diagnosis not present

## 2022-01-03 DIAGNOSIS — E7800 Pure hypercholesterolemia, unspecified: Secondary | ICD-10-CM | POA: Diagnosis not present

## 2022-01-03 DIAGNOSIS — I10 Essential (primary) hypertension: Secondary | ICD-10-CM | POA: Diagnosis not present

## 2022-01-03 DIAGNOSIS — E669 Obesity, unspecified: Secondary | ICD-10-CM | POA: Diagnosis not present

## 2022-01-03 DIAGNOSIS — K5909 Other constipation: Secondary | ICD-10-CM | POA: Diagnosis not present

## 2022-01-03 DIAGNOSIS — M545 Low back pain, unspecified: Secondary | ICD-10-CM | POA: Diagnosis not present

## 2022-01-08 ENCOUNTER — Encounter: Payer: Self-pay | Admitting: *Deleted

## 2022-01-08 ENCOUNTER — Encounter: Payer: Self-pay | Admitting: Cardiology

## 2022-01-08 DIAGNOSIS — G629 Polyneuropathy, unspecified: Secondary | ICD-10-CM | POA: Insufficient documentation

## 2022-01-08 DIAGNOSIS — F3341 Major depressive disorder, recurrent, in partial remission: Secondary | ICD-10-CM

## 2022-01-08 DIAGNOSIS — E669 Obesity, unspecified: Secondary | ICD-10-CM | POA: Insufficient documentation

## 2022-01-08 DIAGNOSIS — E1169 Type 2 diabetes mellitus with other specified complication: Secondary | ICD-10-CM

## 2022-01-08 DIAGNOSIS — I10 Essential (primary) hypertension: Secondary | ICD-10-CM | POA: Insufficient documentation

## 2022-01-08 DIAGNOSIS — E785 Hyperlipidemia, unspecified: Secondary | ICD-10-CM | POA: Insufficient documentation

## 2022-01-31 DIAGNOSIS — F3341 Major depressive disorder, recurrent, in partial remission: Secondary | ICD-10-CM | POA: Diagnosis not present

## 2022-01-31 DIAGNOSIS — E1169 Type 2 diabetes mellitus with other specified complication: Secondary | ICD-10-CM | POA: Diagnosis not present

## 2022-01-31 DIAGNOSIS — K5909 Other constipation: Secondary | ICD-10-CM | POA: Diagnosis not present

## 2022-01-31 DIAGNOSIS — R079 Chest pain, unspecified: Secondary | ICD-10-CM | POA: Diagnosis not present

## 2022-01-31 DIAGNOSIS — M545 Low back pain, unspecified: Secondary | ICD-10-CM | POA: Diagnosis not present

## 2022-01-31 DIAGNOSIS — Z139 Encounter for screening, unspecified: Secondary | ICD-10-CM | POA: Diagnosis not present

## 2022-01-31 DIAGNOSIS — I10 Essential (primary) hypertension: Secondary | ICD-10-CM | POA: Diagnosis not present

## 2022-01-31 DIAGNOSIS — E7800 Pure hypercholesterolemia, unspecified: Secondary | ICD-10-CM | POA: Diagnosis not present

## 2022-01-31 DIAGNOSIS — E669 Obesity, unspecified: Secondary | ICD-10-CM | POA: Diagnosis not present

## 2022-02-07 ENCOUNTER — Encounter: Payer: Self-pay | Admitting: Cardiology

## 2022-02-07 ENCOUNTER — Ambulatory Visit: Payer: Medicare HMO | Admitting: Cardiology

## 2022-02-07 VITALS — BP 106/50 | HR 93 | Ht 62.0 in | Wt 186.0 lb

## 2022-02-07 DIAGNOSIS — I10 Essential (primary) hypertension: Secondary | ICD-10-CM

## 2022-02-07 DIAGNOSIS — R079 Chest pain, unspecified: Secondary | ICD-10-CM

## 2022-02-07 DIAGNOSIS — I209 Angina pectoris, unspecified: Secondary | ICD-10-CM

## 2022-02-07 DIAGNOSIS — E1169 Type 2 diabetes mellitus with other specified complication: Secondary | ICD-10-CM

## 2022-02-07 DIAGNOSIS — E782 Mixed hyperlipidemia: Secondary | ICD-10-CM | POA: Diagnosis not present

## 2022-02-07 DIAGNOSIS — E669 Obesity, unspecified: Secondary | ICD-10-CM

## 2022-02-07 MED ORDER — NITROGLYCERIN 0.4 MG SL SUBL
0.4000 mg | SUBLINGUAL_TABLET | SUBLINGUAL | 2 refills | Status: AC | PRN
Start: 2022-02-07 — End: ?

## 2022-02-07 MED ORDER — METOPROLOL TARTRATE 50 MG PO TABS: 50.0000 mg | ORAL_TABLET | Freq: Once | ORAL | 0 refills | Status: DC

## 2022-02-07 MED ORDER — METOPROLOL TARTRATE 25 MG PO TABS: 25.0000 mg | ORAL_TABLET | Freq: Two times a day (BID) | ORAL | 3 refills | Status: DC

## 2022-02-07 NOTE — Progress Notes (Signed)
? ?Cardiology Consultation:   ? ?Date:  02/07/2022  ? ?ID:  LAVEAH KAWAGUCHI, DOB Dec 31, 1955, MRN [id number] ? ?PCP:  Cher Nakai, MD  ?Cardiologist:  Jenne Campus, MD  ? ?Referring MD: Cher Nakai, MD  ? ?Chief Complaint  ?Patient presents with  ? Chest Pain  ?  Since 10/29/2021  ? ? ?History of Present Illness:   ? ?Cartier Menza Felicetti is a 66 y.o. female who is being seen today for the evaluation of chest pain at the request of Cher Nakai, MD. with past medical history significant for diabetes mellitus, dyslipidemia, essential hypertension.  Since December she started experiencing chest pain.  Chest pain typically typically happen when she is trying to do something.  However it also can happen at rest.  She could be watching TV and developed this sometimes pain can be quite severe up to 7 in scale up up to 10, she does not have shortness of breath associated with that she does not have sweating associated with this taking deep breath coughing does not make any difference pressing the chest wall does not make any difference typical duration of the pain is for few minutes she described that for last month or so does episode became a little less frequent.  There was a time that she was unable to do much because of episode of chest pain. ?Used to smoke but does not smoke anymore she vapes. ?Her diabetes is fairly controlled.  Her hemoglobin A1c was 7.2 ?She does have family history of coronary artery disease but not premature. ? ? ?Past Medical History:  ?Diagnosis Date  ? Anxiety   ? Benign essential hypertension   ? Chronic constipation   ? Diabetes mellitus   ? AODM  ? Elevated liver enzymes   ? Hypercholesteremia   ? Hyperlipidemia   ? Lower back pain   ? Obesity   ? Osteoarthritis of left knee 01/06/2021  ? Peripheral neuropathy   ? Recurrent major depressive disorder in partial remission ([place])   ? Type 2 diabetes mellitus with obesity ([place])   ? ? ?Past Surgical History:  ?Procedure Laterality Date  ? ABDOMINAL  HYSTERECTOMY    ? ? ?Current Medications: ?Current Meds  ?Medication Sig  ? aspirin 81 MG tablet Take 81 mg by mouth daily.  ? atorvastatin (LIPITOR) 80 MG tablet Take 80 mg by mouth daily.  ? Cholecalciferol 25 MCG (1000 UT) capsule Take 1,000 Units by mouth daily.  ? clotrimazole-betamethasone (LOTRISONE) cream Apply 1 application. topically 2 (two) times daily.  ? escitalopram (LEXAPRO) 20 MG tablet Take 20 mg by mouth daily.  ? famotidine (PEPCID) 20 MG tablet Take 20 mg by mouth daily as needed for heartburn or indigestion.  ? glimepiride (AMARYL) 4 MG tablet Take 4 mg by mouth 2 (two) times daily.  ? HYDROcodone-acetaminophen (NORCO/VICODIN) 5-325 MG tablet Take 1 tablet by mouth every 8 (eight) hours as needed for pain.  ? lisinopril (ZESTRIL) 20 MG tablet Take 20 mg by mouth daily.  ? metFORMIN (GLUCOPHAGE) 850 MG tablet Take 850 mg by mouth 3 (three) times daily.  ? traMADol (ULTRAM) 50 MG tablet Take 50 mg by mouth 3 (three) times daily as needed for pain.  ?  ? ?Allergies:   Mobic [meloxicam]  ? ?Social History  ? ?Socioeconomic History  ? Marital status: Married  ?  Spouse name: Not on file  ? Number of children: Not on file  ? Years of education: Not on file  ? Highest  education level: Not on file  ?Occupational History  ? Not on file  ?Tobacco Use  ? Smoking status: Former  ?  Types: E-cigarettes, Cigarettes  ? Smokeless tobacco: Never  ?Vaping Use  ? Vaping Use: Every day  ? Substances: Nicotine  ?Substance and Sexual Activity  ? Alcohol use: Not Currently  ? Drug use: Not Currently  ? Sexual activity: Not on file  ?Other Topics Concern  ? Not on file  ?Social History Narrative  ? Not on file  ? ?Social Determinants of Health  ? ?Financial Resource Strain: Not on file  ?Food Insecurity: Not on file  ?Transportation Needs: Not on file  ?Physical Activity: Not on file  ?Stress: Not on file  ?Social Connections: Not on file  ?  ? ?Family History: ?The patient's family history includes Diabetes in her  father and maternal uncle. ?ROS:   ?Please see the history of present illness.    ?All 14 point review of systems negative except as described per history of present illness. ? ?EKGs/Labs/Other Studies Reviewed:   ? ?The following studies were reviewed today: ? ? ?EKG:  EKG is  ordered today.  The ekg ordered today demonstrates EKG showed normal sinus rhythm, incomplete right bundle branch block, left anterior hemiblock, voltage criteria for LVH ? ?Recent Labs: ?No results found for requested labs within last 8760 hours.  ?Recent Lipid Panel ?No results found for: CHOL, TRIG, HDL, CHOLHDL, VLDL, LDLCALC, LDLDIRECT ? ?Physical Exam:   ? ?VS:  BP (!) 106/50 (BP Location: Right Arm, Patient Position: Sitting)   Pulse 93   Ht 5\' 2"  (1.575 m)   Wt 186 lb (84.4 kg)   SpO2 94%   BMI 34.02 kg/m?    ? ?Wt Readings from Last 3 Encounters:  ?02/07/22 186 lb (84.4 kg)  ?01/03/22 186 lb (84.4 kg)  ?01/17/21 179 lb (81.2 kg)  ?  ? ?GEN:  Well nourished, well developed in no acute distress ?HEENT: Normal ?NECK: No JVD; No carotid bruits ?LYMPHATICS: No lymphadenopathy ?CARDIAC: RRR, no murmurs, no rubs, no gallops ?RESPIRATORY:  Clear to auscultation without rales, wheezing or rhonchi  ?ABDOMEN: Soft, non-tender, non-distended ?MUSCULOSKELETAL:  No edema; No deformity  ?SKIN: Warm and dry ?NEUROLOGIC:  Alert and oriented x 3 ?PSYCHIATRIC:  Normal affect  ? ?ASSESSMENT:   ? ?1. Benign essential hypertension   ?2. Angina pectoris ([place])   ?3. Type 2 diabetes mellitus with obesity ([place])   ?4. Mixed hyperlipidemia   ? ?PLAN:   ? ?In order of problems listed above: ? ?Chest pain suspicion for angina pectoris.  We debated about how to approach this problem we will talk about options including medical therapy versus stress testing versus coronary CT angio versus cardiac catheterization.  She is a very coronary CT angio.  I did explain procedure to her including all risk benefits as well as alternatives, will proceed.  In the meantime  she is taking already antiplatelet therapy in form of aspirin which I will continue.  I will give her nitroglycerin as needed, I will also put her on metoprolol 25 twice daily. ?Benign essential hypertension blood pressure seems to be reasonably controlled her blood pressure actually is on the lower side 106/50.  We will continue present management. ?Dyslipidemia, she is already on statin he is taking Lipitor 80 which is high intense statin last fasting lipid profile from December of last year with LDL of 79 HDL 45.  That is a decent cholesterol profile we will continue  for now however if she truly got coronary artery disease which I strongly suspect she does we will have to maximize this therapy target LDL will be less than 75 or less than 55. ?Since she does have right bundle branch block left anterior hemiblock I also asked her to have an echocardiogram to assess left ventricle ejection fraction. ? ? ?Medication Adjustments/Labs and Tests Ordered: ?Current medicines are reviewed at length with the patient today.  Concerns regarding medicines are outlined above.  ?No orders of the defined types were placed in this encounter. ? ?No orders of the defined types were placed in this encounter. ? ? ?Signed, ?Park Liter, MD, [facility]. ?02/07/2022 3:36 PM    ?[place] ? ?

## 2022-02-07 NOTE — Patient Instructions (Addendum)
Medication Instructions:  ?Your physician has recommended you make the following change in your medication:  ? ?START: Metoprolol tartrate 25 mg twice daily ?START: Nitroglycerin 0.4 mg under the tongue every 5 minutes as needed for chest pain  ? ?*If you need a refill on your cardiac medications before your next appointment, please call your pharmacy* ? ? ?Lab Work: ? ?BMP 1 week before CT ? ?If you have labs (blood work) drawn today and your tests are completely normal, you will receive your results only by: ?MyChart Message (if you have MyChart) OR ?A paper copy in the mail ?If you have any lab test that is abnormal or we need to change your treatment, we will call you to review the results. ? ? ?Testing/Procedures: ? ? ?Your cardiac CT will be scheduled at one of the below locations:  ? ?[facility] ?[address] ?[place], [place] [address] ?[phone] ? ?OR ? ?[facility] ?[address] ?[address] ?[place], [place] [address] ?[phone] ? ?If scheduled at [facility], please arrive at the [facility] and Children's Entrance (Entrance C2) of [facility] 30 minutes prior to test start time. ?You can use the FREE valet parking offered at entrance C (encouraged to control the heart rate for the test)  ?Proceed to the [facility] Radiology Department (first floor) to check-in and test prep. ? ?All radiology patients and guests should use entrance C2 at [facility], accessed from [facility], even though the hospital's physical address listed is [address]. ? ? ? ?If scheduled at [facility], please arrive 15 mins early for check-in and test prep. ? ?Please follow these instructions carefully (unless otherwise directed): ? ?On the Night Before the Test: ?Be sure to Drink plenty of water. ?Do not consume any caffeinated/decaffeinated beverages or chocolate 12 hours prior to your test. ?Do not  take any antihistamines 12 hours prior to your test. ? ?On the Day of the Test: ?Drink plenty of water until 1 hour prior to the test. ?Do not eat any food 4 hours prior to the test. ?You may take your regular medications prior to the test.  ?Take metoprolol (Lopressor) two hours prior to test. ?FEMALES- please wear underwire-free bra if available, avoid dresses & tight clothing ? ?     ?After the Test: ?Drink plenty of water. ?After receiving IV contrast, you may experience a mild flushed feeling. This is normal. ?On occasion, you may experience a mild rash up to 24 hours after the test. This is not dangerous. If this occurs, you can take Benadryl 25 mg and increase your fluid intake. ?If you experience trouble breathing, this can be serious. If it is severe call 911 IMMEDIATELY. If it is mild, please call our office. ?If you take any of these medications: Glipizide/Metformin, Avandament, Glucavance, please do not take 48 hours after completing test unless otherwise instructed. ? ?We will call to schedule your test 2-4 weeks out understanding that some insurance companies will need an authorization prior to the service being performed.  ? ?For non-scheduling related questions, please contact the cardiac imaging nurse navigator should you have any questions/concerns: ?Rockwell Alexandria, Cardiac Imaging Nurse Navigator ?Larey Brick, Cardiac Imaging Nurse Navigator ?[facility] ?Direct Office Dial: [phone]  ? ?For scheduling needs, including cancellations and rescheduling, please call [place], [phone]. ? ?Your physician has requested that you have an echocardiogram. Echocardiography is a painless test that  uses sound waves to create images of your heart. It provides your doctor with information about the size and shape of your heart and how well your heart?s chambers and valves are working. This procedure takes approximately one hour. There are no restrictions for this  procedure. ? ? ? ? ?Follow-Up: ?At [facility], you and your health needs are our priority.  As part of our continuing mission to provide you with exceptional heart care, we have created designated Provider Care Teams.  These Care Teams include your primary Cardiologist (physician) and Advanced Practice Providers (APPs -  Physician Assistants and Nurse Practitioners) who all work together to provide you with the care you need, when you need it. ? ?We recommend signing up for the patient portal called "MyChart".  Sign up information is provided on this After Visit Summary.  MyChart is used to connect with patients for Virtual Visits (Telemedicine).  Patients are able to view lab/test results, encounter notes, upcoming appointments, etc.  Non-urgent messages can be sent to your provider as well.   ?To learn more about what you can do with MyChart, go to [email or web address].   ? ?Your next appointment:   ?3 month(s) ? ?The format for your next appointment:   ?In Person ? ?Provider:   ?Gypsy Balsam, MD  ? ? ?Other Instructions ?None ? ?

## 2022-02-13 ENCOUNTER — Ambulatory Visit (INDEPENDENT_AMBULATORY_CARE_PROVIDER_SITE_OTHER): Payer: Medicare HMO

## 2022-02-13 DIAGNOSIS — E669 Obesity, unspecified: Secondary | ICD-10-CM | POA: Diagnosis not present

## 2022-02-13 DIAGNOSIS — E1169 Type 2 diabetes mellitus with other specified complication: Secondary | ICD-10-CM

## 2022-02-13 DIAGNOSIS — E782 Mixed hyperlipidemia: Secondary | ICD-10-CM

## 2022-02-13 DIAGNOSIS — R079 Chest pain, unspecified: Secondary | ICD-10-CM

## 2022-02-13 DIAGNOSIS — I209 Angina pectoris, unspecified: Secondary | ICD-10-CM

## 2022-02-13 DIAGNOSIS — I10 Essential (primary) hypertension: Secondary | ICD-10-CM | POA: Diagnosis not present

## 2022-02-13 LAB — ECHOCARDIOGRAM COMPLETE
Area-P 1/2: 3.4 cm2
Calc EF: 52.3 %
S' Lateral: 4 cm
Single Plane A2C EF: 51.3 %
Single Plane A4C EF: 51.3 %

## 2022-02-14 LAB — BASIC METABOLIC PANEL
BUN/Creatinine Ratio: 16 (ref 12–28)
BUN: 15 mg/dL (ref 8–27)
CO2: 26 mmol/L (ref 20–29)
Calcium: 9.5 mg/dL (ref 8.7–10.3)
Chloride: 100 mmol/L (ref 96–106)
Creatinine, Ser: 0.91 mg/dL (ref 0.57–1.00)
Glucose: 243 mg/dL — ABNORMAL HIGH (ref 70–99)
Potassium: 5.6 mmol/L — ABNORMAL HIGH (ref 3.5–5.2)
Sodium: 137 mmol/L (ref 134–144)
eGFR: 69 mL/min/{1.73_m2} (ref >59)

## 2022-02-19 ENCOUNTER — Telehealth: Payer: Self-pay

## 2022-02-19 MED ORDER — LISINOPRIL 10 MG PO TABS
10.0000 mg | ORAL_TABLET | Freq: Every day | ORAL | 0 refills | Status: DC
Start: 2022-02-19 — End: 2022-05-15

## 2022-02-19 NOTE — Telephone Encounter (Signed)
-----   Message from Georgeanna Lea, MD sent at 02/15/2022 10:04 AM EDT ----- ?Potassium is elevated, make sure she does not take any potassium, she need to reduce her lisinopril from 20 mg to only 10 mg daily, ?

## 2022-02-21 ENCOUNTER — Telehealth (HOSPITAL_COMMUNITY): Payer: Self-pay | Admitting: *Deleted

## 2022-02-21 NOTE — Telephone Encounter (Signed)
Reaching out to patient to offer assistance regarding upcoming cardiac imaging study; pt verbalizes understanding of appt date/time, parking situation and where to check in, pre-test NPO status and medications ordered, and verified current allergies; name and call back number provided for further questions should they arise ? ?Gordy Clement RN Navigator Cardiac Imaging ?[facility] ?[phone] office ?[phone] cell ? ?Patient to take 75mg  metoprolol tartrate two hours prior to her cardiac CT scan.  She aware to arrive at 10:30am for her scan. ?

## 2022-02-21 NOTE — Telephone Encounter (Signed)
Attempted to call patient regarding upcoming cardiac CT appointment. °Left message on voicemail with name and callback number ° °Trinette Vera RN Navigator Cardiac Imaging °[facility] °[phone] Office °[phone] Cell ° °

## 2022-02-22 ENCOUNTER — Ambulatory Visit (HOSPITAL_COMMUNITY)
Admission: RE | Admit: 2022-02-22 | Discharge: 2022-02-22 | Disposition: A | Discharge status: Home / Self Care | Payer: Medicare HMO | Source: Ambulatory Visit | Attending: Cardiovascular Disease | Admitting: Cardiovascular Disease

## 2022-02-22 ENCOUNTER — Encounter (HOSPITAL_COMMUNITY): Payer: Self-pay

## 2022-02-22 ENCOUNTER — Ambulatory Visit (HOSPITAL_COMMUNITY)
Admission: RE | Admit: 2022-02-22 | Discharge: 2022-02-22 | Disposition: A | Discharge status: Home / Self Care | Payer: Medicare HMO | Source: Ambulatory Visit | Attending: Cardiology | Admitting: Cardiology

## 2022-02-22 ENCOUNTER — Other Ambulatory Visit: Payer: Self-pay | Admitting: Cardiovascular Disease

## 2022-02-22 DIAGNOSIS — R072 Precordial pain: Secondary | ICD-10-CM

## 2022-02-22 DIAGNOSIS — R079 Chest pain, unspecified: Secondary | ICD-10-CM

## 2022-02-22 DIAGNOSIS — R931 Abnormal findings on diagnostic imaging of heart and coronary circulation: Secondary | ICD-10-CM | POA: Diagnosis not present

## 2022-02-22 DIAGNOSIS — I2510 Atherosclerotic heart disease of native coronary artery without angina pectoris: Secondary | ICD-10-CM | POA: Diagnosis not present

## 2022-02-22 MED ORDER — NITROGLYCERIN 0.4 MG SL SUBL
SUBLINGUAL_TABLET | SUBLINGUAL | Status: AC
  Filled 2022-02-22: qty 2

## 2022-02-22 MED ORDER — IOHEXOL 350 MG/ML SOLN
100.0000 mL | Freq: Once | INTRAVENOUS | Status: AC | PRN
  Administered 2022-02-22: 100 mL via INTRAVENOUS

## 2022-02-22 MED ORDER — NITROGLYCERIN 0.4 MG SL SUBL
0.8000 mg | SUBLINGUAL_TABLET | Freq: Once | SUBLINGUAL | Status: AC
Start: 2022-02-22 — End: 2022-02-22
  Administered 2022-02-22: 0.8 mg via SUBLINGUAL

## 2022-02-22 NOTE — Progress Notes (Signed)
CT FFR submitted.  ? ?[place] T. Audie Box, MD, [facility] ?[place]  ?[address] ?[place], [place] [address] ?[phone]  ?1:28 PM ? ?

## 2022-02-27 ENCOUNTER — Telehealth: Payer: Self-pay

## 2022-02-27 NOTE — Telephone Encounter (Signed)
-----   Message from Park Liter, MD sent at 02/23/2022  9:05 AM EDT ----- ?Coronary CT angiogram abnormal.  Patient need to have a follow-up appointment to talk about this ?

## 2022-02-27 NOTE — Telephone Encounter (Signed)
Unable to reach the patient, letter mailed with results ?

## 2022-02-28 DIAGNOSIS — F3341 Major depressive disorder, recurrent, in partial remission: Secondary | ICD-10-CM | POA: Diagnosis not present

## 2022-02-28 DIAGNOSIS — G629 Polyneuropathy, unspecified: Secondary | ICD-10-CM | POA: Diagnosis not present

## 2022-02-28 DIAGNOSIS — E1169 Type 2 diabetes mellitus with other specified complication: Secondary | ICD-10-CM | POA: Diagnosis not present

## 2022-02-28 DIAGNOSIS — E7800 Pure hypercholesterolemia, unspecified: Secondary | ICD-10-CM | POA: Diagnosis not present

## 2022-02-28 DIAGNOSIS — E669 Obesity, unspecified: Secondary | ICD-10-CM | POA: Diagnosis not present

## 2022-02-28 DIAGNOSIS — M545 Low back pain, unspecified: Secondary | ICD-10-CM | POA: Diagnosis not present

## 2022-02-28 DIAGNOSIS — K5909 Other constipation: Secondary | ICD-10-CM | POA: Diagnosis not present

## 2022-02-28 DIAGNOSIS — R079 Chest pain, unspecified: Secondary | ICD-10-CM | POA: Diagnosis not present

## 2022-02-28 DIAGNOSIS — I10 Essential (primary) hypertension: Secondary | ICD-10-CM | POA: Diagnosis not present

## 2022-03-07 ENCOUNTER — Telehealth: Payer: Self-pay | Admitting: Cardiology

## 2022-03-07 NOTE — Telephone Encounter (Signed)
Left vm for pt to callback 

## 2022-03-07 NOTE — Telephone Encounter (Signed)
Patient called to discuss letter regarding CT results ?

## 2022-03-08 NOTE — Telephone Encounter (Signed)
Patient notified of results, appt set up on Wedneday 5/10 ?

## 2022-03-14 ENCOUNTER — Encounter: Payer: Self-pay | Admitting: Cardiology

## 2022-03-14 ENCOUNTER — Ambulatory Visit: Payer: Medicare HMO | Admitting: Cardiology

## 2022-03-14 VITALS — BP 134/52 | HR 71 | Ht 62.0 in | Wt 187.8 lb

## 2022-03-14 DIAGNOSIS — I2510 Atherosclerotic heart disease of native coronary artery without angina pectoris: Secondary | ICD-10-CM | POA: Insufficient documentation

## 2022-03-14 DIAGNOSIS — E1169 Type 2 diabetes mellitus with other specified complication: Secondary | ICD-10-CM

## 2022-03-14 DIAGNOSIS — E782 Mixed hyperlipidemia: Secondary | ICD-10-CM | POA: Diagnosis not present

## 2022-03-14 DIAGNOSIS — I25118 Atherosclerotic heart disease of native coronary artery with other forms of angina pectoris: Secondary | ICD-10-CM | POA: Diagnosis not present

## 2022-03-14 DIAGNOSIS — E669 Obesity, unspecified: Secondary | ICD-10-CM | POA: Diagnosis not present

## 2022-03-14 DIAGNOSIS — I10 Essential (primary) hypertension: Secondary | ICD-10-CM | POA: Diagnosis not present

## 2022-03-14 MED ORDER — ISOSORBIDE MONONITRATE ER 30 MG PO TB24
30.0000 mg | ORAL_TABLET | Freq: Every day | ORAL | 3 refills | Status: DC
Start: 2022-03-14 — End: 2022-10-19

## 2022-03-14 NOTE — Progress Notes (Signed)
?Cardiology Office Note:   ? ?Date:  03/14/2022  ? ?ID:  Angel Reynolds, DOB 1956-06-22, MRN [id number] ? ?PCP:  Simone Curia, MD  ?Cardiologist:  Gypsy Balsam, MD   ? ?Referring MD: Simone Curia, MD  ? ?Chief Complaint  ?Patient presents with  ? Follow-up  ? ? ?History of Present Illness:   ? ?Angel Reynolds is a 66 y.o. female who was referred to [place] because of chest pain.  She does have multiple risk factors for coronary artery disease which include diabetes, dyslipidemia, essential hypertension, she did have coronary CT angio performed.  That showed completely occluded right coronary artery, significant obtuse marginal 1 branch disease, also borderline mid LAD disease.  She is here to talk about options for this situation.  Still described to have some chest pain likely does happen quite rarely.  And less pronounced than previously but still present. ? ?Past Medical History:  ?Diagnosis Date  ? Anxiety   ? Benign essential hypertension   ? Chronic constipation   ? Diabetes mellitus   ? AODM  ? Elevated liver enzymes   ? Hypercholesteremia   ? Hyperlipidemia   ? Lower back pain   ? Obesity   ? Osteoarthritis of left knee 01/06/2021  ? Peripheral neuropathy   ? Recurrent major depressive disorder in partial remission (H[facility])   ? Type 2 diabetes mellitus with obesity (H[facility])   ? ? ?Past Surgical History:  ?Procedure Laterality Date  ? ABDOMINAL HYSTERECTOMY    ? ? ?Current Medications: ?Current Meds  ?Medication Sig  ? aspirin 81 MG tablet Take 81 mg by mouth daily.  ? atorvastatin (LIPITOR) 80 MG tablet Take 80 mg by mouth daily.  ? Cholecalciferol 25 MCG (1000 UT) capsule Take 1,000 Units by mouth daily.  ? clotrimazole-betamethasone (LOTRISONE) cream Apply 1 application. topically 2 (two) times daily.  ? escitalopram (LEXAPRO) 20 MG tablet Take 20 mg by mouth daily.  ? famotidine (PEPCID) 20 MG tablet Take 20 mg by mouth daily as needed for heartburn or indigestion.  ? glimepiride (AMARYL) 4 MG tablet Take 4 mg by  mouth 2 (two) times daily.  ? HYDROcodone-acetaminophen (NORCO/VICODIN) 5-325 MG tablet Take 1 tablet by mouth every 8 (eight) hours as needed for pain.  ? lisinopril (ZESTRIL) 10 MG tablet Take 1 tablet (10 mg total) by mouth daily.  ? metFORMIN (GLUCOPHAGE) 850 MG tablet Take 850 mg by mouth 3 (three) times daily.  ? metoprolol tartrate (LOPRESSOR) 25 MG tablet Take 1 tablet (25 mg total) by mouth 2 (two) times daily.  ? metoprolol tartrate (LOPRESSOR) 50 MG tablet Take 1 tablet (50 mg total) by mouth once for 1 dose. Please take 2 hours before CT  ? nitroGLYCERIN (NITROSTAT) 0.4 MG SL tablet Place 1 tablet (0.4 mg total) under the tongue every 5 (five) minutes as needed for chest pain.  ? traMADol (ULTRAM) 50 MG tablet Take 50 mg by mouth 3 (three) times daily as needed for pain.  ?  ? ?Allergies:   Mobic [meloxicam]  ? ?Social History  ? ?Socioeconomic History  ? Marital status: Married  ?  Spouse name: Not on file  ? Number of children: Not on file  ? Years of education: Not on file  ? Highest education level: Not on file  ?Occupational History  ? Not on file  ?Tobacco Use  ? Smoking status: Former  ?  Types: E-cigarettes, Cigarettes  ? Smokeless tobacco: Never  ?Vaping Use  ? Vaping Use:  Every day  ? Substances: Nicotine  ?Substance and Sexual Activity  ? Alcohol use: Not Currently  ? Drug use: Not Currently  ? Sexual activity: Not on file  ?Other Topics Concern  ? Not on file  ?Social History Narrative  ? Not on file  ? ?Social Determinants of Health  ? ?Financial Resource Strain: Not on file  ?Food Insecurity: Not on file  ?Transportation Needs: Not on file  ?Physical Activity: Not on file  ?Stress: Not on file  ?Social Connections: Not on file  ?  ? ?Family History: ?The patient's family history includes Diabetes in her father and maternal uncle. ?ROS:   ?Please see the history of present illness.    ?All 14 point review of systems negative except as described per history of present  illness ? ?EKGs/Labs/Other Studies Reviewed:   ? ?Coronary CT angio done on 02 March 2022 showed: ?IMPRESSION: ?1. Coronary calcium score of 5105. This was 99th percentile for ?age-, sex, and race-matched controls. ?  ?2. Normal coronary origin with right dominance. ?  ?3. Severe calcified plaque (70-99%) in the mid LAD. ?  ?4. Proximal RCA appears occluded (100%). ?  ?5. Moderate calcified plaque (50-69%) in the distal LCX. ?  ?6. Severe stenosis in OM1 (70-99%). ? ? ?Fractional flow reserve performed showed: ?FINDINGS: ?1. Left Main: 1.0; low likelihood of hemodynamic significance. ?  ?2. Prox LAD: 0.96; low likelihood of hemodynamic significance. ?3. Mid LAD: 0.80; low likelihood of hemodynamic significance. ?4. Distal LAD: 0.73; low likelihood of hemodynamic significance. ?Tapering artifact. Delta FFR 0.07. ?5. LCX: 0.95; low likelihood of hemodynamic significance. ?6. OM1: 0.67; high likelihood of hemodynamic significance. ?7. RCA: Occluded. ?  ?IMPRESSION: ?  ?1.  Mid LAD negative by CT FFR. ?  ?2.  Distal LCX negative by CT FFR. ?  ?3.  OM1 positive by CT FFR. ?  ?4.  RCA not modeled as likely occluded. ?  ?[name], MD ? ?Recent Labs: ?02/13/2022: BUN 15; Creatinine, Ser 0.91; Potassium 5.6; Sodium 137  ?Recent Lipid Panel ?No results found for: CHOL, TRIG, HDL, CHOLHDL, VLDL, LDLCALC, LDLDIRECT ? ?Physical Exam:   ? ?VS:  BP (!) 134/52 (BP Location: Right Arm, Patient Position: Sitting)   Pulse 71   Ht 5' 2" (1.575 m)   Wt 187 lb 12.8 oz (85.2 kg)   SpO2 96%   BMI 34.35 kg/m?    ? ?Wt Readings from Last 3 Encounters:  ?03/14/22 187 lb 12.8 oz (85.2 kg)  ?02/07/22 186 lb (84.4 kg)  ?01/03/22 186 lb (84.4 kg)  ?  ? ?GEN:  Well nourished, well developed in no acute distress ?HEENT: Normal ?NECK: No JVD; No carotid bruits ?LYMPHATICS: No lymphadenopathy ?CARDIAC: RRR, no murmurs, no rubs, no gallops ?RESPIRATORY:  Clear to auscultation without rales, wheezing or rhonchi  ?ABDOMEN: Soft, non-tender,  non-distended ?MUSCULOSKELETAL:  No edema; No deformity  ?SKIN: Warm and dry ?LOWER EXTREMITIES: no swelling ?NEUROLOGIC:  Alert and oriented x 3 ?PSYCHIATRIC:  Normal affect  ? ?ASSESSMENT:   ? ?1. Benign essential hypertension   ?2. Coronary artery disease of native artery of native heart with stable angina pectoris (HCC)   ?3. Type 2 diabetes mellitus with obesity (HCC)   ?4. Mixed hyperlipidemia   ? ?PLAN:   ? ?In order of problems listed above: ? ?Coronary artery disease coronary CT angio analyzed with the patient with her husband.  We did talk about options for this situation option being medical therapy versus cardiac catheterization.    Because of completely occluded right coronary artery as well as significant obtuse marginal branch disease and borderline mid LAD lesion I think she deserves to have a cardiac catheterization done to see if she would be candidate for intervention.  I think we can intervene obtuse marginal branch, procedure was explained to her including all risk benefits as well as alternatives.  In the meantime we will continue his aspirin will continue high intensity statin, I will put her on long-acting nitroglycerin she already does have nitroglycerin as needed. ?Benign essential hypertension blood pressure slightly on the higher side should be better with addition of nitroglycerin. ?Type 2 diabetes that being followed by internal medicine team I did review K PN from April 26 her hemoglobin A1c was 8.4, obviously clearly elevated need to be better controlled.  In the future we will place referral to endocrinology. ?Mixed dyslipidemia she is on Lipitor 80 which I will continue this is increased after previous visit her last LDL was 85 HDL 39.  We will recheck fasting lipid profile later she need to get LDL less than 70. ? ? ?Medication Adjustments/Labs and Tests Ordered: ?Current medicines are reviewed at length with the patient today.  Concerns regarding medicines are outlined above.  ?No  orders of the defined types were placed in this encounter. ? ?Medication changes: No orders of the defined types were placed in this encounter. ? ? ?Signed, ?Georgeanna Lea, MD, [place] ?03/14/2022 10:53 AM    ?[facility]

## 2022-03-14 NOTE — Patient Instructions (Signed)
Medication Instructions:  ?Your physician has recommended you make the following change in your medication:  ? ?START: Imdur 30 mg daily ? ?*If you need a refill on your cardiac medications before your next appointment, please call your pharmacy* ? ? ?Lab Work: ?Your physician recommends that you return for lab work in:  ? ?Labs today: BMP, CBC ? ?If you have labs (blood work) drawn today and your tests are completely normal, you will receive your results only by: ?MyChart Message (if you have MyChart) OR ?A paper copy in the mail ?If you have any lab test that is abnormal or we need to change your treatment, we will call you to review the results. ? ? ?Testing/Procedures: ? ?[facility] ?[facility] [place] ?[address] ?[place] [address] ?Dept: [phone] ?Loc: [phone] ? ?Angel Reynolds  03/14/2022 ? ?You are scheduled for a Cardiac Catheterization on Monday, May 15 with Dr. Verdis Prime. ? ?1. Please arrive at the Main Entrance A at [facility]: [address] [place], [place] [address] at 7:00 AM (This time is two hours before your procedure to ensure your preparation). Free valet parking service is available.  ? ?Special note: Every effort is made to have your procedure done on time. Please understand that emergencies sometimes delay scheduled procedures. ? ?2. Diet: Do not eat solid foods after midnight.  You may have clear liquids until 5 AM upon the day of the procedure. ? ?3. Labs: You will need to have blood drawn on Wednesday, May 10 at [facility]: [address] [place] . You do not need to be fasting. ? ?4. Medication instructions in preparation for your procedure: ? ? Contrast Allergy: No ? ? ?Do not take Diabetes Med Glucophage (Metformin) on the day of the procedure and HOLD 48 HOURS AFTER THE PROCEDURE. ? ?On the morning of your procedure, take Aspirin and any morning medicines NOT listed above.  You may use sips of  water. ? ?5. Plan to go home the same day, you will only stay overnight if medically necessary. ?6. You MUST have a responsible adult to drive you home. ?7. An adult MUST be with you the first 24 hours after you arrive home. ?8. Bring a current list of your medications, and the last time and date medication taken. ?9. Bring ID and current insurance cards. ?10.Please wear clothes that are easy to get on and off and wear slip-on shoes. ? ?Thank you for allowing [place] to care for you! ?  -- [facility] ? ? ? ?Follow-Up: ?At [facility], you and your health needs are our priority.  As part of our continuing mission to provide you with exceptional heart care, we have created designated Provider Care Teams.  These Care Teams include your primary Cardiologist (physician) and Advanced Practice Providers (APPs -  Physician Assistants and Nurse Practitioners) who all work together to provide you with the care you need, when you need it. ? ?We recommend signing up for the patient portal called "MyChart".  Sign up information is provided on this After Visit Summary.  MyChart is used to connect with patients for Virtual Visits (Telemedicine).  Patients are able to view lab/test results, encounter notes, upcoming appointments, etc.  Non-urgent messages can be sent to your provider as well.   ?To learn more about what you can do with MyChart, go to [email or web address].   ? ?Your next appointment:   ?2  month(s) ? ?The format for your next appointment:   ?In Person ? ?Provider:   ?Gypsy Balsam, MD  ? ? ?Other Instructions ?None ? ?Important Information About Sugar ? ? ? ? ? ? ?

## 2022-03-14 NOTE — H&P (View-Only) (Signed)
?Cardiology Office Note:   ? ?Date:  03/14/2022  ? ?ID:  Angel Reynolds, DOB 1956-06-22, MRN [id number] ? ?PCP:  Simone Curia, MD  ?Cardiologist:  Gypsy Balsam, MD   ? ?Referring MD: Simone Curia, MD  ? ?Chief Complaint  ?Patient presents with  ? Follow-up  ? ? ?History of Present Illness:   ? ?Angel Reynolds is a 66 y.o. female who was referred to [place] because of chest pain.  She does have multiple risk factors for coronary artery disease which include diabetes, dyslipidemia, essential hypertension, she did have coronary CT angio performed.  That showed completely occluded right coronary artery, significant obtuse marginal 1 branch disease, also borderline mid LAD disease.  She is here to talk about options for this situation.  Still described to have some chest pain likely does happen quite rarely.  And less pronounced than previously but still present. ? ?Past Medical History:  ?Diagnosis Date  ? Anxiety   ? Benign essential hypertension   ? Chronic constipation   ? Diabetes mellitus   ? AODM  ? Elevated liver enzymes   ? Hypercholesteremia   ? Hyperlipidemia   ? Lower back pain   ? Obesity   ? Osteoarthritis of left knee 01/06/2021  ? Peripheral neuropathy   ? Recurrent major depressive disorder in partial remission (HCC)   ? Type 2 diabetes mellitus with obesity (HCC)   ? ? ?Past Surgical History:  ?Procedure Laterality Date  ? ABDOMINAL HYSTERECTOMY    ? ? ?Current Medications: ?Current Meds  ?Medication Sig  ? aspirin 81 MG tablet Take 81 mg by mouth daily.  ? atorvastatin (LIPITOR) 80 MG tablet Take 80 mg by mouth daily.  ? Cholecalciferol 25 MCG (1000 UT) capsule Take 1,000 Units by mouth daily.  ? clotrimazole-betamethasone (LOTRISONE) cream Apply 1 application. topically 2 (two) times daily.  ? escitalopram (LEXAPRO) 20 MG tablet Take 20 mg by mouth daily.  ? famotidine (PEPCID) 20 MG tablet Take 20 mg by mouth daily as needed for heartburn or indigestion.  ? glimepiride (AMARYL) 4 MG tablet Take 4 mg by  mouth 2 (two) times daily.  ? HYDROcodone-acetaminophen (NORCO/VICODIN) 5-325 MG tablet Take 1 tablet by mouth every 8 (eight) hours as needed for pain.  ? lisinopril (ZESTRIL) 10 MG tablet Take 1 tablet (10 mg total) by mouth daily.  ? metFORMIN (GLUCOPHAGE) 850 MG tablet Take 850 mg by mouth 3 (three) times daily.  ? metoprolol tartrate (LOPRESSOR) 25 MG tablet Take 1 tablet (25 mg total) by mouth 2 (two) times daily.  ? metoprolol tartrate (LOPRESSOR) 50 MG tablet Take 1 tablet (50 mg total) by mouth once for 1 dose. Please take 2 hours before CT  ? nitroGLYCERIN (NITROSTAT) 0.4 MG SL tablet Place 1 tablet (0.4 mg total) under the tongue every 5 (five) minutes as needed for chest pain.  ? traMADol (ULTRAM) 50 MG tablet Take 50 mg by mouth 3 (three) times daily as needed for pain.  ?  ? ?Allergies:   Mobic [meloxicam]  ? ?Social History  ? ?Socioeconomic History  ? Marital status: Married  ?  Spouse name: Not on file  ? Number of children: Not on file  ? Years of education: Not on file  ? Highest education level: Not on file  ?Occupational History  ? Not on file  ?Tobacco Use  ? Smoking status: Former  ?  Types: E-cigarettes, Cigarettes  ? Smokeless tobacco: Never  ?Vaping Use  ? Vaping Use:  Every day  ? Substances: Nicotine  ?Substance and Sexual Activity  ? Alcohol use: Not Currently  ? Drug use: Not Currently  ? Sexual activity: Not on file  ?Other Topics Concern  ? Not on file  ?Social History Narrative  ? Not on file  ? ?Social Determinants of Health  ? ?Financial Resource Strain: Not on file  ?Food Insecurity: Not on file  ?Transportation Needs: Not on file  ?Physical Activity: Not on file  ?Stress: Not on file  ?Social Connections: Not on file  ?  ? ?Family History: ?The patient's family history includes Diabetes in her father and maternal uncle. ?ROS:   ?Please see the history of present illness.    ?All 14 point review of systems negative except as described per history of present  illness ? ?EKGs/Labs/Other Studies Reviewed:   ? ?Coronary CT angio done on 02 March 2022 showed: ?IMPRESSION: ?1. Coronary calcium score of 5105. This was 99th percentile for ?age-, sex, and race-matched controls. ?  ?2. Normal coronary origin with right dominance. ?  ?3. Severe calcified plaque (70-99%) in the mid LAD. ?  ?4. Proximal RCA appears occluded (100%). ?  ?5. Moderate calcified plaque (50-69%) in the distal LCX. ?  ?6. Severe stenosis in OM1 (70-99%). ? ? ?Fractional flow reserve performed showed: ?FINDINGS: ?1. Left Main: 1.0; low likelihood of hemodynamic significance. ?  ?2. Prox LAD: 0.96; low likelihood of hemodynamic significance. ?3. Mid LAD: 0.80; low likelihood of hemodynamic significance. ?4. Distal LAD: 0.73; low likelihood of hemodynamic significance. ?Tapering artifact. Delta FFR 0.07. ?5. LCX: 0.95; low likelihood of hemodynamic significance. ?6. OM1: 0.67; high likelihood of hemodynamic significance. ?7. RCA: Occluded. ?  ?IMPRESSION: ?  ?1.  Mid LAD negative by CT FFR. ?  ?2.  Distal LCX negative by CT FFR. ?  ?3.  OM1 positive by CT FFR. ?  ?4.  RCA not modeled as likely occluded. ?  ?Lennie OdorWesley O'Neal, MD ? ?Recent Labs: ?02/13/2022: BUN 15; Creatinine, Ser 0.91; Potassium 5.6; Sodium 137  ?Recent Lipid Panel ?No results found for: CHOL, TRIG, HDL, CHOLHDL, VLDL, LDLCALC, LDLDIRECT ? ?Physical Exam:   ? ?VS:  BP (!) 134/52 (BP Location: Right Arm, Patient Position: Sitting)   Pulse 71   Ht 5\' 2"  (1.575 m)   Wt 187 lb 12.8 oz (85.2 kg)   SpO2 96%   BMI 34.35 kg/m?    ? ?Wt Readings from Last 3 Encounters:  ?03/14/22 187 lb 12.8 oz (85.2 kg)  ?02/07/22 186 lb (84.4 kg)  ?01/03/22 186 lb (84.4 kg)  ?  ? ?GEN:  Well nourished, well developed in no acute distress ?HEENT: Normal ?NECK: No JVD; No carotid bruits ?LYMPHATICS: No lymphadenopathy ?CARDIAC: RRR, no murmurs, no rubs, no gallops ?RESPIRATORY:  Clear to auscultation without rales, wheezing or rhonchi  ?ABDOMEN: Soft, non-tender,  non-distended ?MUSCULOSKELETAL:  No edema; No deformity  ?SKIN: Warm and dry ?LOWER EXTREMITIES: no swelling ?NEUROLOGIC:  Alert and oriented x 3 ?PSYCHIATRIC:  Normal affect  ? ?ASSESSMENT:   ? ?1. Benign essential hypertension   ?2. Coronary artery disease of native artery of native heart with stable angina pectoris (HCC)   ?3. Type 2 diabetes mellitus with obesity (HCC)   ?4. Mixed hyperlipidemia   ? ?PLAN:   ? ?In order of problems listed above: ? ?Coronary artery disease coronary CT angio analyzed with the patient with her husband.  We did talk about options for this situation option being medical therapy versus cardiac catheterization.  Because of completely occluded right coronary artery as well as significant obtuse marginal branch disease and borderline mid LAD lesion I think she deserves to have a cardiac catheterization done to see if she would be candidate for intervention.  I think we can intervene obtuse marginal branch, procedure was explained to her including all risk benefits as well as alternatives.  In the meantime we will continue his aspirin will continue high intensity statin, I will put her on long-acting nitroglycerin she already does have nitroglycerin as needed. ?Benign essential hypertension blood pressure slightly on the higher side should be better with addition of nitroglycerin. ?Type 2 diabetes that being followed by internal medicine team I did review K PN from April 26 her hemoglobin A1c was 8.4, obviously clearly elevated need to be better controlled.  In the future we will place referral to endocrinology. ?Mixed dyslipidemia she is on Lipitor 80 which I will continue this is increased after previous visit her last LDL was 85 HDL 39.  We will recheck fasting lipid profile later she need to get LDL less than 70. ? ? ?Medication Adjustments/Labs and Tests Ordered: ?Current medicines are reviewed at length with the patient today.  Concerns regarding medicines are outlined above.  ?No  orders of the defined types were placed in this encounter. ? ?Medication changes: No orders of the defined types were placed in this encounter. ? ? ?Signed, ?Georgeanna Lea, MD, [place] ?03/14/2022 10:53 AM    ?[facility]

## 2022-03-15 ENCOUNTER — Telehealth: Payer: Self-pay | Admitting: *Deleted

## 2022-03-15 LAB — BASIC METABOLIC PANEL
BUN/Creatinine Ratio: 13 (ref 12–28)
BUN: 12 mg/dL (ref 8–27)
CO2: 22 mmol/L (ref 20–29)
Calcium: 9.3 mg/dL (ref 8.7–10.3)
Chloride: 103 mmol/L (ref 96–106)
Creatinine, Ser: 0.94 mg/dL (ref 0.57–1.00)
Glucose: 164 mg/dL — ABNORMAL HIGH (ref 70–99)
Potassium: 5.1 mmol/L (ref 3.5–5.2)
Sodium: 142 mmol/L (ref 134–144)
eGFR: 67 mL/min/{1.73_m2} (ref >59)

## 2022-03-15 LAB — CBC
Hematocrit: 34.5 % (ref 34.0–46.6)
Hemoglobin: 10.9 g/dL — ABNORMAL LOW (ref 11.1–15.9)
MCH: 26.7 pg (ref 26.6–33.0)
MCHC: 31.6 g/dL (ref 31.5–35.7)
MCV: 85 fL (ref 79–97)
Platelets: 389 10*3/uL (ref 150–450)
RBC: 4.08 x10E6/uL (ref 3.77–5.28)
RDW: 13.9 % (ref 11.7–15.4)
WBC: 6.1 10*3/uL (ref 3.4–10.8)

## 2022-03-15 NOTE — Telephone Encounter (Signed)
Cardiac Catheterization scheduled at [facility] for: Monday Mar 19, 2022 9 AM ?Arrival time and place: [facility] Main Entrance A at: 7 AM ? ? ?Nothing to eat after midnight prior to procedure, clear liquids until 5 AM day of procedure. ? ?Medication instructions: ?-Hold: ? Metformin-day of procedure and 48 hours post procedure ?-Except hold medications usual morning medications can be taken with sips of water including aspirin 81 mg. ? ?Confirmed patient has responsible adult to drive home post procedure and be with patient first 24 hours after arriving home. ? ?Patient reports no new symptoms concerning for COVID-19/no exposure to COVID-19 in the past 10 days. ? ?Reviewed procedure instructions with patient.  ?

## 2022-03-18 NOTE — H&P (Signed)
Multiple risk factors. ?CTFFR with total occlusion RCA, borderline LAD, and very distal OM disease. Likely med therapy unless CTO possible. ?

## 2022-03-19 ENCOUNTER — Inpatient Hospital Stay (HOSPITAL_COMMUNITY)
Admission: RE | Admit: 2022-03-19 | Discharge: 2022-03-21 | DRG: 948 | Disposition: A | Discharge status: Home / Self Care | Payer: Medicare HMO | Attending: Family Medicine | Admitting: Family Medicine

## 2022-03-19 ENCOUNTER — Encounter (HOSPITAL_COMMUNITY)
Admission: RE | Disposition: A | Discharge status: Home / Self Care | Payer: Self-pay | Source: Home / Self Care | Attending: Internal Medicine

## 2022-03-19 ENCOUNTER — Inpatient Hospital Stay (HOSPITAL_COMMUNITY): Payer: Medicare HMO

## 2022-03-19 ENCOUNTER — Other Ambulatory Visit: Payer: Self-pay

## 2022-03-19 ENCOUNTER — Ambulatory Visit (HOSPITAL_COMMUNITY): Payer: Medicare HMO

## 2022-03-19 DIAGNOSIS — Z7984 Long term (current) use of oral hypoglycemic drugs: Secondary | ICD-10-CM

## 2022-03-19 DIAGNOSIS — R4182 Altered mental status, unspecified: Secondary | ICD-10-CM

## 2022-03-19 DIAGNOSIS — T424X5A Adverse effect of benzodiazepines, initial encounter: Secondary | ICD-10-CM | POA: Diagnosis present

## 2022-03-19 DIAGNOSIS — E669 Obesity, unspecified: Secondary | ICD-10-CM | POA: Diagnosis not present

## 2022-03-19 DIAGNOSIS — E1142 Type 2 diabetes mellitus with diabetic polyneuropathy: Secondary | ICD-10-CM | POA: Diagnosis not present

## 2022-03-19 DIAGNOSIS — Z833 Family history of diabetes mellitus: Secondary | ICD-10-CM

## 2022-03-19 DIAGNOSIS — I209 Angina pectoris, unspecified: Secondary | ICD-10-CM | POA: Diagnosis not present

## 2022-03-19 DIAGNOSIS — E785 Hyperlipidemia, unspecified: Secondary | ICD-10-CM | POA: Diagnosis present

## 2022-03-19 DIAGNOSIS — G894 Chronic pain syndrome: Secondary | ICD-10-CM

## 2022-03-19 DIAGNOSIS — Z7982 Long term (current) use of aspirin: Secondary | ICD-10-CM | POA: Diagnosis not present

## 2022-03-19 DIAGNOSIS — Z87891 Personal history of nicotine dependence: Secondary | ICD-10-CM

## 2022-03-19 DIAGNOSIS — F339 Major depressive disorder, recurrent, unspecified: Secondary | ICD-10-CM | POA: Diagnosis present

## 2022-03-19 DIAGNOSIS — I2510 Atherosclerotic heart disease of native coronary artery without angina pectoris: Secondary | ICD-10-CM | POA: Diagnosis not present

## 2022-03-19 DIAGNOSIS — I2582 Chronic total occlusion of coronary artery: Secondary | ICD-10-CM | POA: Diagnosis present

## 2022-03-19 DIAGNOSIS — I25118 Atherosclerotic heart disease of native coronary artery with other forms of angina pectoris: Secondary | ICD-10-CM | POA: Diagnosis not present

## 2022-03-19 DIAGNOSIS — E119 Type 2 diabetes mellitus without complications: Secondary | ICD-10-CM | POA: Diagnosis not present

## 2022-03-19 DIAGNOSIS — R410 Disorientation, unspecified: Principal | ICD-10-CM | POA: Diagnosis present

## 2022-03-19 DIAGNOSIS — E1169 Type 2 diabetes mellitus with other specified complication: Secondary | ICD-10-CM | POA: Diagnosis present

## 2022-03-19 DIAGNOSIS — Z79899 Other long term (current) drug therapy: Secondary | ICD-10-CM | POA: Diagnosis not present

## 2022-03-19 DIAGNOSIS — K5909 Other constipation: Secondary | ICD-10-CM | POA: Diagnosis present

## 2022-03-19 DIAGNOSIS — Z6833 Body mass index (BMI) 33.0-33.9, adult: Secondary | ICD-10-CM | POA: Diagnosis not present

## 2022-03-19 DIAGNOSIS — T402X5A Adverse effect of other opioids, initial encounter: Secondary | ICD-10-CM | POA: Diagnosis present

## 2022-03-19 DIAGNOSIS — I672 Cerebral atherosclerosis: Secondary | ICD-10-CM | POA: Diagnosis not present

## 2022-03-19 DIAGNOSIS — I25119 Atherosclerotic heart disease of native coronary artery with unspecified angina pectoris: Secondary | ICD-10-CM

## 2022-03-19 DIAGNOSIS — I10 Essential (primary) hypertension: Secondary | ICD-10-CM | POA: Diagnosis not present

## 2022-03-19 DIAGNOSIS — R451 Restlessness and agitation: Secondary | ICD-10-CM | POA: Diagnosis not present

## 2022-03-19 DIAGNOSIS — Z9889 Other specified postprocedural states: Secondary | ICD-10-CM | POA: Diagnosis not present

## 2022-03-19 DIAGNOSIS — F32A Depression, unspecified: Secondary | ICD-10-CM

## 2022-03-19 DIAGNOSIS — E782 Mixed hyperlipidemia: Secondary | ICD-10-CM | POA: Diagnosis present

## 2022-03-19 DIAGNOSIS — I639 Cerebral infarction, unspecified: Secondary | ICD-10-CM | POA: Diagnosis not present

## 2022-03-19 DIAGNOSIS — L89152 Pressure ulcer of sacral region, stage 2: Secondary | ICD-10-CM | POA: Diagnosis present

## 2022-03-19 DIAGNOSIS — F1123 Opioid dependence with withdrawal: Secondary | ICD-10-CM | POA: Diagnosis not present

## 2022-03-19 DIAGNOSIS — L8990 Pressure ulcer of unspecified site, unspecified stage: Secondary | ICD-10-CM | POA: Insufficient documentation

## 2022-03-19 DIAGNOSIS — F05 Delirium due to known physiological condition: Secondary | ICD-10-CM | POA: Diagnosis not present

## 2022-03-19 DIAGNOSIS — T8189XA Other complications of procedures, not elsewhere classified, initial encounter: Secondary | ICD-10-CM | POA: Diagnosis not present

## 2022-03-19 DIAGNOSIS — I700 Atherosclerosis of aorta: Secondary | ICD-10-CM | POA: Diagnosis not present

## 2022-03-19 DIAGNOSIS — R29818 Other symptoms and signs involving the nervous system: Secondary | ICD-10-CM | POA: Diagnosis not present

## 2022-03-19 HISTORY — PX: LEFT HEART CATH AND CORONARY ANGIOGRAPHY: CATH118249

## 2022-03-19 HISTORY — PX: INTRAVASCULAR PRESSURE WIRE/FFR STUDY: CATH118243

## 2022-03-19 LAB — CBC WITH DIFFERENTIAL/PLATELET
Abs Immature Granulocytes: 0.04 10*3/uL (ref 0.00–0.07)
Basophils Absolute: 0 10*3/uL (ref 0.0–0.1)
Basophils Relative: 0 %
Eosinophils Absolute: 0 10*3/uL (ref 0.0–0.5)
Eosinophils Relative: 0 %
HCT: 34.6 % — ABNORMAL LOW (ref 36.0–46.0)
Hemoglobin: 10.9 g/dL — ABNORMAL LOW (ref 12.0–15.0)
Immature Granulocytes: 0 %
Lymphocytes Relative: 14 %
Lymphs Abs: 1.3 10*3/uL (ref 0.7–4.0)
MCH: 26.9 pg (ref 26.0–34.0)
MCHC: 31.5 g/dL (ref 30.0–36.0)
MCV: 85.4 fL (ref 80.0–100.0)
Monocytes Absolute: 0.6 10*3/uL (ref 0.1–1.0)
Monocytes Relative: 6 %
Neutro Abs: 7.1 10*3/uL (ref 1.7–7.7)
Neutrophils Relative %: 80 %
Platelets: 315 10*3/uL (ref 150–400)
RBC: 4.05 MIL/uL (ref 3.87–5.11)
RDW: 14.5 % (ref 11.5–15.5)
WBC: 9.1 10*3/uL (ref 4.0–10.5)
nRBC: 0 % (ref 0.0–0.2)

## 2022-03-19 LAB — POCT ACTIVATED CLOTTING TIME: Activated Clotting Time: 221 seconds

## 2022-03-19 LAB — COMPREHENSIVE METABOLIC PANEL
ALT: 25 U/L (ref 0–44)
AST: 24 U/L (ref 15–41)
Albumin: 4 g/dL (ref 3.5–5.0)
Alkaline Phosphatase: 44 U/L (ref 38–126)
Anion gap: 12 (ref 5–15)
BUN: 13 mg/dL (ref 8–23)
CO2: 23 mmol/L (ref 22–32)
Calcium: 9.1 mg/dL (ref 8.9–10.3)
Chloride: 103 mmol/L (ref 98–111)
Creatinine, Ser: 1.02 mg/dL — ABNORMAL HIGH (ref 0.44–1.00)
GFR, Estimated: >60 mL/min (ref >60)
Glucose, Bld: 191 mg/dL — ABNORMAL HIGH (ref 70–99)
Potassium: 5 mmol/L (ref 3.5–5.1)
Sodium: 138 mmol/L (ref 135–145)
Total Bilirubin: 0.8 mg/dL (ref 0.3–1.2)
Total Protein: 7.2 g/dL (ref 6.5–8.1)

## 2022-03-19 LAB — HIV ANTIBODY (ROUTINE TESTING W REFLEX): HIV Screen 4th Generation wRfx: NONREACTIVE

## 2022-03-19 LAB — MRSA NEXT GEN BY PCR, NASAL: MRSA by PCR Next Gen: NOT DETECTED

## 2022-03-19 LAB — GLUCOSE, CAPILLARY
Glucose-Capillary: 136 mg/dL — ABNORMAL HIGH (ref 70–99)
Glucose-Capillary: 158 mg/dL — ABNORMAL HIGH (ref 70–99)
Glucose-Capillary: 195 mg/dL — ABNORMAL HIGH (ref 70–99)
Glucose-Capillary: 172 mg/dL — ABNORMAL HIGH (ref 70–99)

## 2022-03-19 LAB — APTT: aPTT: 136 seconds — ABNORMAL HIGH (ref 24–36)

## 2022-03-19 LAB — HEMOGLOBIN A1C
Hgb A1c MFr Bld: 8.2 % — ABNORMAL HIGH (ref 4.8–5.6)
Mean Plasma Glucose: 188.64 mg/dL

## 2022-03-19 SURGERY — LEFT HEART CATH AND CORONARY ANGIOGRAPHY
Anesthesia: LOCAL

## 2022-03-19 MED ORDER — SODIUM CHLORIDE 0.9 % IV SOLN: INTRAVENOUS | Status: AC

## 2022-03-19 MED ORDER — HYDROMORPHONE HCL 1 MG/ML IJ SOLN
2.0000 mg | Freq: Once | INTRAMUSCULAR | Status: DC
  Filled 2022-03-19: qty 2

## 2022-03-19 MED ORDER — VERAPAMIL HCL 2.5 MG/ML IV SOLN
INTRAVENOUS | Status: AC
Start: 2022-03-19 — End: ?
  Filled 2022-03-19: qty 2

## 2022-03-19 MED ORDER — IOHEXOL 350 MG/ML SOLN
INTRAVENOUS | Status: DC | PRN
  Administered 2022-03-19: 110 mL

## 2022-03-19 MED ORDER — FENTANYL CITRATE (PF) 100 MCG/2ML IJ SOLN
INTRAMUSCULAR | Status: DC | PRN
  Administered 2022-03-19: 25 ug via INTRAVENOUS

## 2022-03-19 MED ORDER — ONDANSETRON HCL 4 MG/2ML IJ SOLN: 4.0000 mg | Freq: Four times a day (QID) | INTRAMUSCULAR | Status: DC | PRN

## 2022-03-19 MED ORDER — CHLORHEXIDINE GLUCONATE CLOTH 2 % EX PADS
6.0000 | MEDICATED_PAD | Freq: Every day | CUTANEOUS | Status: DC
  Administered 2022-03-19 – 2022-03-20 (×2): 6 via TOPICAL

## 2022-03-19 MED ORDER — HEPARIN SODIUM (PORCINE) 1000 UNIT/ML IJ SOLN
INTRAMUSCULAR | Status: AC
  Filled 2022-03-19: qty 10

## 2022-03-19 MED ORDER — HALOPERIDOL LACTATE 5 MG/ML IJ SOLN
INTRAMUSCULAR | Status: AC
  Administered 2022-03-19: 5 mg
  Filled 2022-03-19: qty 1

## 2022-03-19 MED ORDER — SODIUM CHLORIDE 0.9% FLUSH: 3.0000 mL | INTRAVENOUS | Status: DC | PRN

## 2022-03-19 MED ORDER — SODIUM CHLORIDE 0.9 % IV SOLN
250.0000 mL | INTRAVENOUS | Status: DC | PRN
Start: 2022-03-19 — End: 2022-03-21

## 2022-03-19 MED ORDER — LABETALOL HCL 5 MG/ML IV SOLN: 10.0000 mg | INTRAVENOUS | Status: AC | PRN

## 2022-03-19 MED ORDER — HALOPERIDOL LACTATE 5 MG/ML IJ SOLN: 5.0000 mg | Freq: Four times a day (QID) | INTRAMUSCULAR | Status: DC | PRN

## 2022-03-19 MED ORDER — SODIUM CHLORIDE 0.9 % WEIGHT BASED INFUSION: 3.0000 mL/kg/h | INTRAVENOUS | Status: DC

## 2022-03-19 MED ORDER — HYDROMORPHONE HCL 1 MG/ML IJ SOLN
1.0000 mg | Freq: Once | INTRAMUSCULAR | Status: AC
  Administered 2022-03-19: 1 mg via INTRAVENOUS
  Filled 2022-03-19: qty 1

## 2022-03-19 MED ORDER — MIDAZOLAM HCL 2 MG/2ML IJ SOLN
INTRAMUSCULAR | Status: AC
  Filled 2022-03-19: qty 2

## 2022-03-19 MED ORDER — VERAPAMIL HCL 2.5 MG/ML IV SOLN
INTRAVENOUS | Status: DC | PRN
  Administered 2022-03-19: 10 mL via INTRA_ARTERIAL

## 2022-03-19 MED ORDER — FENTANYL CITRATE (PF) 100 MCG/2ML IJ SOLN
12.5000 ug | Freq: Once | INTRAMUSCULAR | Status: AC
  Administered 2022-03-19: 12.5 ug via INTRAVENOUS

## 2022-03-19 MED ORDER — SODIUM CHLORIDE 0.9% FLUSH
3.0000 mL | Freq: Two times a day (BID) | INTRAVENOUS | Status: DC
  Administered 2022-03-20 (×3): 3 mL via INTRAVENOUS

## 2022-03-19 MED ORDER — METOPROLOL TARTRATE 25 MG PO TABS
25.0000 mg | ORAL_TABLET | Freq: Two times a day (BID) | ORAL | Status: DC
  Administered 2022-03-20 – 2022-03-21 (×3): 25 mg via ORAL
  Filled 2022-03-19 (×3): qty 1

## 2022-03-19 MED ORDER — LORAZEPAM 2 MG/ML IJ SOLN
INTRAMUSCULAR | Status: AC
  Filled 2022-03-19: qty 1

## 2022-03-19 MED ORDER — OXYCODONE HCL 5 MG PO TABS: 5.0000 mg | ORAL_TABLET | ORAL | Status: DC | PRN

## 2022-03-19 MED ORDER — LIDOCAINE HCL (PF) 1 % IJ SOLN
INTRAMUSCULAR | Status: DC | PRN
Start: 2022-03-19 — End: 2022-03-19
  Administered 2022-03-19: 2 mL via INTRADERMAL

## 2022-03-19 MED ORDER — SODIUM CHLORIDE 0.9% FLUSH
3.0000 mL | Freq: Two times a day (BID) | INTRAVENOUS | Status: DC
  Administered 2022-03-19 – 2022-03-20 (×3): 3 mL via INTRAVENOUS

## 2022-03-19 MED ORDER — HALOPERIDOL LACTATE 5 MG/ML IJ SOLN: 5.0000 mg | Freq: Once | INTRAMUSCULAR | Status: AC

## 2022-03-19 MED ORDER — HYDRALAZINE HCL 20 MG/ML IJ SOLN: 10.0000 mg | INTRAMUSCULAR | Status: AC | PRN

## 2022-03-19 MED ORDER — SODIUM CHLORIDE 0.9 % IV SOLN: 250.0000 mL | INTRAVENOUS | Status: DC | PRN

## 2022-03-19 MED ORDER — MIDAZOLAM HCL 2 MG/2ML IJ SOLN
INTRAMUSCULAR | Status: DC | PRN
  Administered 2022-03-19: 1 mg via INTRAVENOUS

## 2022-03-19 MED ORDER — ASPIRIN 81 MG PO CHEW: 81.0000 mg | CHEWABLE_TABLET | ORAL | Status: DC

## 2022-03-19 MED ORDER — ESCITALOPRAM OXALATE 10 MG PO TABS
10.0000 mg | ORAL_TABLET | Freq: Every day | ORAL | Status: DC
  Administered 2022-03-20 – 2022-03-21 (×2): 10 mg via ORAL
  Filled 2022-03-19 (×2): qty 1

## 2022-03-19 MED ORDER — DEXMEDETOMIDINE HCL IN NACL 400 MCG/100ML IV SOLN
0.4000 ug/kg/h | INTRAVENOUS | Status: DC
  Administered 2022-03-19: 0.5 ug/kg/h via INTRAVENOUS
  Administered 2022-03-19: 1.8 ug/kg/h via INTRAVENOUS
  Filled 2022-03-19: qty 200
  Filled 2022-03-19: qty 100

## 2022-03-19 MED ORDER — ACETAMINOPHEN 325 MG PO TABS: 650.0000 mg | ORAL_TABLET | ORAL | Status: DC | PRN

## 2022-03-19 MED ORDER — FAMOTIDINE 20 MG PO TABS: 20.0000 mg | ORAL_TABLET | Freq: Every day | ORAL | Status: DC | PRN

## 2022-03-19 MED ORDER — LORAZEPAM 2 MG/ML IJ SOLN
2.0000 mg | Freq: Once | INTRAMUSCULAR | Status: AC
  Administered 2022-03-19: 2 mg via INTRAVENOUS

## 2022-03-19 MED ORDER — INSULIN ASPART 100 UNIT/ML IJ SOLN
0.0000 [IU] | INTRAMUSCULAR | Status: DC
  Administered 2022-03-19 – 2022-03-20 (×3): 3 [IU] via SUBCUTANEOUS
  Administered 2022-03-20 (×2): 8 [IU] via SUBCUTANEOUS
  Administered 2022-03-20 – 2022-03-21 (×2): 2 [IU] via SUBCUTANEOUS

## 2022-03-19 MED ORDER — SODIUM CHLORIDE 0.9 % WEIGHT BASED INFUSION: 1.0000 mL/kg/h | INTRAVENOUS | Status: DC

## 2022-03-19 MED ORDER — POLYETHYLENE GLYCOL 3350 17 G PO PACK: 17.0000 g | PACK | Freq: Every day | ORAL | Status: DC | PRN

## 2022-03-19 MED ORDER — HEPARIN SODIUM (PORCINE) 5000 UNIT/ML IJ SOLN
5000.0000 [IU] | Freq: Three times a day (TID) | INTRAMUSCULAR | Status: DC
  Administered 2022-03-19 – 2022-03-21 (×5): 5000 [IU] via SUBCUTANEOUS
  Filled 2022-03-19 (×5): qty 1

## 2022-03-19 MED ORDER — HEPARIN SODIUM (PORCINE) 1000 UNIT/ML IJ SOLN
INTRAMUSCULAR | Status: DC | PRN
  Administered 2022-03-19: 4000 [IU] via INTRAVENOUS
  Administered 2022-03-19: 2000 [IU] via INTRAVENOUS
  Administered 2022-03-19: 4500 [IU] via INTRAVENOUS

## 2022-03-19 MED ORDER — VITAMIN D 25 MCG (1000 UNIT) PO TABS
1000.0000 [IU] | ORAL_TABLET | Freq: Every day | ORAL | Status: DC
  Administered 2022-03-20 – 2022-03-21 (×2): 1000 [IU] via ORAL
  Filled 2022-03-19 (×2): qty 1

## 2022-03-19 MED ORDER — DOCUSATE SODIUM 100 MG PO CAPS: 100.0000 mg | ORAL_CAPSULE | Freq: Two times a day (BID) | ORAL | Status: DC | PRN

## 2022-03-19 MED ORDER — ASPIRIN 81 MG PO CHEW: 81.0000 mg | CHEWABLE_TABLET | Freq: Every day | ORAL | Status: DC

## 2022-03-19 MED ORDER — LIDOCAINE HCL (PF) 1 % IJ SOLN
INTRAMUSCULAR | Status: AC
Start: 2022-03-19 — End: ?
  Filled 2022-03-19: qty 30

## 2022-03-19 MED ORDER — HYDROCODONE-ACETAMINOPHEN 5-325 MG PO TABS
1.0000 | ORAL_TABLET | Freq: Three times a day (TID) | ORAL | Status: DC | PRN
  Administered 2022-03-20: 1 via ORAL
  Filled 2022-03-19 (×2): qty 1

## 2022-03-19 MED ORDER — FENTANYL CITRATE (PF) 100 MCG/2ML IJ SOLN
INTRAMUSCULAR | Status: AC
  Filled 2022-03-19: qty 2

## 2022-03-19 MED ORDER — ONDANSETRON HCL 4 MG/2ML IJ SOLN
4.0000 mg | Freq: Four times a day (QID) | INTRAMUSCULAR | Status: DC | PRN
Start: 2022-03-19 — End: 2022-03-21

## 2022-03-19 MED ORDER — HEPARIN (PORCINE) IN NACL 1000-0.9 UT/500ML-% IV SOLN
INTRAVENOUS | Status: DC | PRN
  Administered 2022-03-19 (×3): 500 mL

## 2022-03-19 MED ORDER — HEPARIN SODIUM (PORCINE) 1000 UNIT/ML IJ SOLN
INTRAMUSCULAR | Status: AC
Start: 2022-03-19 — End: ?
  Filled 2022-03-19: qty 10

## 2022-03-19 MED ORDER — HYDROMORPHONE HCL 1 MG/ML IJ SOLN: 1.0000 mg | INTRAMUSCULAR | Status: DC | PRN

## 2022-03-19 MED ORDER — ASPIRIN EC 81 MG PO TBEC
81.0000 mg | DELAYED_RELEASE_TABLET | Freq: Every day | ORAL | Status: DC
  Administered 2022-03-20 – 2022-03-21 (×2): 81 mg via ORAL
  Filled 2022-03-19 (×2): qty 1

## 2022-03-19 MED ORDER — ATORVASTATIN CALCIUM 80 MG PO TABS
80.0000 mg | ORAL_TABLET | Freq: Every evening | ORAL | Status: DC
  Administered 2022-03-20: 80 mg via ORAL
  Filled 2022-03-19: qty 1

## 2022-03-19 MED ORDER — HEPARIN (PORCINE) IN NACL 1000-0.9 UT/500ML-% IV SOLN
INTRAVENOUS | Status: AC
  Filled 2022-03-19: qty 1000

## 2022-03-19 SURGICAL SUPPLY — 14 items
CATH 5FR JL3.5 JR4 ANG PIG MP (CATHETERS) ×1 IMPLANT
CATH LAUNCHER 6FR EBU3.5 (CATHETERS) ×1 IMPLANT
DEVICE RAD COMP TR BAND LRG (VASCULAR PRODUCTS) ×1 IMPLANT
GLIDESHEATH SLEND A-KIT 6F 22G (SHEATH) ×1 IMPLANT
GUIDEWIRE INQWIRE 1.5J.035X260 (WIRE) IMPLANT
GUIDEWIRE PRESSURE X 175 (WIRE) ×1 IMPLANT
INQWIRE 1.5J .035X260CM (WIRE) ×2
KIT ESSENTIALS PG (KITS) ×1 IMPLANT
KIT HEART LEFT (KITS) ×2 IMPLANT
PACK CARDIAC CATHETERIZATION (CUSTOM PROCEDURE TRAY) ×2 IMPLANT
SHEATH PROBE COVER 6X72 (BAG) ×1 IMPLANT
TRANSDUCER W/STOPCOCK (MISCELLANEOUS) ×2 IMPLANT
TUBING CIL FLEX 10 FLL-RA (TUBING) ×2 IMPLANT
WIRE HI TORQ VERSACORE-J 145CM (WIRE) ×1 IMPLANT

## 2022-03-19 NOTE — Interval H&P Note (Signed)
Cath Lab Visit (complete for each Cath Lab visit) ? ?Clinical Evaluation Leading to the Procedure:  ? ?ACS: No. ? ?Non-ACS:   ? ?Anginal Classification: CCS II ? ?Anti-ischemic medical therapy: Minimal Therapy (1 class of medications) ? ?Non-Invasive Test Results: Intermediate-risk stress test findings: cardiac mortality 1-3%/year ? ?Prior CABG: No previous CABG ? ? ? ? ? ?History and Physical Interval Note: ? ?03/19/2022 ?9:40 AM ? ?Angel Reynolds  has presented today for surgery, with the diagnosis of cad.  The various methods of treatment have been discussed with the patient and family. After consideration of risks, benefits and other options for treatment, the patient has consented to  Procedure(s): ?LEFT HEART CATH AND CORONARY ANGIOGRAPHY (N/A) as a surgical intervention.  The patient's history has been reviewed, patient examined, no change in status, stable for surgery.  I have reviewed the patient's chart and labs.  Questions were answered to the patient's satisfaction.   ? ? ?Lyn Records III ? ? ?

## 2022-03-19 NOTE — Code Documentation (Signed)
Stroke Response Nurse Documentation ?Code Documentation ? ?Lilliam Chamblee Goyer is a 66 y.o. female from short status post cardiac cath where she was LKW at 1130 and ex husband noted she wasn't acting right. Nurse stated patient was normal post procedure and had walked to the bathroom without issue. Now is slow to respond and different than before. Past medical history of diabetes, hyperlipidemia, obesity, lower back pain, benign essential hypertension, anxiety, peripheral neuropathy, and recurrent major depressive disorder in partial remission. Code stroke activated by short stay.  ? ?Stroke team assessed patient in short stay. Difficulty with words. Last does of fentanyl received in procedure at 0945. Radial band in place, no bleeding or hematoma noted. Patient to CT with team. Heparin given in case. Stat PT/IRN drawn and sent to lab. NIHSS 3, see documentation for details and code stroke times. Patient with not following commands and Expressive aphasia  on exam. Patient refused to lay back for CT. Multiple attempts to help her be more comfortable in CT. Continued to fight and curse at team. Ativan and fentanyl given. Still would not cooperate. Taken back to short stay room with plans for EEG and admit to hospital. PCCM consulted. Patient increased combativeness and agitation in short stay. Began to become lethargic and lay back in bed. PCCM team at bedside for admit.  ? ?Sherlyn Lees  ?Stroke Response RN ?  ?

## 2022-03-19 NOTE — Progress Notes (Signed)
Pt's family member called out and stated pt was not acting right. This RN and pt's RN went in to assess. Pt unable to answer date, day, month, year, or who the President is. Pt knew where she was. VSS. CBG 158. MD Katrinka Blazing paged and Code stroke called. LKW was 1135. ?

## 2022-03-19 NOTE — Progress Notes (Signed)
EEG complete - results pending 

## 2022-03-19 NOTE — Progress Notes (Signed)
Patient unavailable for EEG.  Per RN, she is going to CT. ?

## 2022-03-19 NOTE — Progress Notes (Signed)
Patient still down in MRI.  ?

## 2022-03-19 NOTE — Progress Notes (Signed)
Patient arrived to short stay after left cardiac catheterization. Patient was alert and oriented upon arrival around 1030. At 1115, Clinical research associate took patient to restroom per request. At 1130, patients' husband Jillyn Hidden pushed call button to notify writer that the patient appears to be "not acting right." Writer assessed patient and patient appeared to have a flat affect, slow to follow commands and not having eye contact when speaking to Retail banker. Dr. Katrinka Blazing was notified of patient's change in cognition and came to assess patient. Dr. Katrinka Blazing called a code stroke for further work up. Dr. Otelia Limes assessed patient and ordered a CT scan. Patient would not lay back to do the scan, after receiving 2mg  Ativan and of Fentanyl at CT. Patient was transported back to procedural short stay where she was being combative and attempting to hit, kick and bite the and other staff and yelling. Safety mitts were placed on patient for staff and patient's protection. Critical care MD came to assess the patient and put in orders to admit the patient for further observation. Patient was transferred to [facility] room 1. Bedside handoff report given to nurse Zach. ?

## 2022-03-19 NOTE — CV Procedure (Signed)
Severe calcific three-vessel coronary disease with the following findings: Total occlusion of the proximal to mid RCA with left-to-right collateral, segmental 80% mid LAD (RFR abnormal at 0.81), 70% mid to distal OM1, 95% distal circumflex, total occlusion of OM 2 which is collateralized by OM 3. ?Inferobasal akinesis ?Ejection fraction 45 to 50%. ?

## 2022-03-19 NOTE — Consult Note (Addendum)
?                    NEURO HOSPITALIST CONSULT NOTE  ? ?Requestig physician: Dr. Katrinka Blazing ? ?Reason for Consult: Acute onset of altered mental status ? ?History obtained from:  RN, Husband and Chart    ? ?HPI:                                                                                                                                         ? Angel Reynolds is an 66 y.o. female with a PMHx of anxiety, HTN, DM2, hypercholesterolemia, HLD, obesity, peripheral neuropathy and recurrent major depressive disorder in partial remission who is status-post cardiac catheterization today revealing severe calcific three-vessel coronary disease. Three boluses of heparin were given during the procedure. Sedation consisted of fentanyl 25 mcg IV and 1 mg IV midazolam. Following the procedure, patient the patient was easily arousable and fully oriented, answering all questions normally. About 1.5 hours into her recovery, she started to exhibit a flattened affect and then her ability to communicate declined. She was noted to have poor attention and word-finding difficulty as well as abnormal language comprehension. Code Stroke was then called.  ? ?Past Medical History:  ?Diagnosis Date  ? Anxiety   ? Benign essential hypertension   ? Chronic constipation   ? Diabetes mellitus   ? AODM  ? Elevated liver enzymes   ? Hypercholesteremia   ? Hyperlipidemia   ? Lower back pain   ? Obesity   ? Osteoarthritis of left knee 01/06/2021  ? Peripheral neuropathy   ? Recurrent major depressive disorder in partial remission (HCC)   ? Type 2 diabetes mellitus with obesity (HCC)   ? ? ?Past Surgical History:  ?Procedure Laterality Date  ? ABDOMINAL HYSTERECTOMY    ? ? ?Family History  ?Problem Relation Age of Onset  ? Diabetes Father   ? Diabetes Maternal Uncle   ?         ? ?Social History:  reports that she has quit smoking. Her smoking use included e-cigarettes and cigarettes. She has never used smokeless tobacco. She reports that she does not  currently use alcohol. She reports that she does not currently use drugs. ? ?Allergies  ?Allergen Reactions  ? Mobic [Meloxicam] Palpitations  ?  "heart flutters"   ? ? ?MEDICATIONS:                                                                                                                     ?  Scheduled: ? aspirin  81 mg Oral Pre-Cath  ? aspirin  81 mg Oral Daily  ? fentaNYL      ? LORazepam      ? LORazepam      ? sodium chloride flush  3 mL Intravenous Q12H  ? sodium chloride flush  3 mL Intravenous Q12H  ? ?Continuous: ? sodium chloride    ? sodium chloride 100 mL/hr at 03/19/22 1040  ? sodium chloride    ? sodium chloride    ? ? ? ?ROS:                                                                                                                                       ?Unable to obtain a reliable ROS due to AMS.  ? ? ?Blood pressure 140/72, pulse 68, temperature 98.7 ?F (37.1 ?C), temperature source Oral, resp. rate 18, height 5\' 2"  (1.575 m), weight 84.8 kg, SpO2 95 %. ? ? ?General Examination:                                                                                                      ? ?Physical Exam  ?HEENT-  [place]/AT   ?Lungs- Respirations unlabored ?Extremities- Warm and well perfused ? ? ?Neurological Examination ?Mental Status: Awake with decreased level of alertness. Bizarre, absent affect with poor eye contact. Speech is sparse but fluent, mostly 2-3 word statements but some short phrases as well. Can count fingers and identify a thumb and her ear when they are touched, but did not name any visually presented objects. Exclaims and giggles in response to several components of the exam below. No dysarthria noted.  ?Cranial Nerves: ?II: PERRL. Does not reliably blink to threat in right or left temporal visual fields.  ?III,IV, VI: No ptosis. Eyes are conjugate at the midline with blank staring quality but will intermittently track some visual stimuli.  ?V: Reacts to cool temp bilaterally with  loud exclamations and giggling, but does not respond to fine touch stimuli.  ?VII: Smile symmetric ?VIII: Hearing intact to some questions and commands ?IX,X: No hoarseness or hypophonia ?XI: Head is midline ?XII: Midline tongue extension to command ?Motor: ?RUE: Immobilized post catheterization but grip is 5/5 and upon becoming agitated, she lifts RUE without evidence of weakness ?RLE 5/5 ?LUE 5/5 ?LLE 5/5 ?Above in the context of being poorly cooperative with motor testing.  ?Sensory: Reacts briskly and equally to cool stimulation of BUE and BLE. Also with equal responses to  plantar stimulation.  ?Deep Tendon Reflexes: Normoactive ?Plantars: Right: downgoing   Left: downgoing ?Cerebellar: No gross ataxia. Not following commands for detailed testing.  ?Gait: Deferred ?  ?Lab Results: ?Basic Metabolic Panel: ?Recent Labs  ?Lab 03/14/22 ?1131  ?NA 142  ?K 5.1  ?CL 103  ?CO2 22  ?GLUCOSE 164*  ?BUN 12  ?CREATININE 0.94  ?CALCIUM 9.3  ? ? ?CBC: ?Recent Labs  ?Lab 03/14/22 ?1131  ?WBC 6.1  ?HGB 10.9*  ?HCT 34.5  ?MCV 85  ?PLT 389  ? ? ?Cardiac Enzymes: ?No results for input(s): CKTOTAL, CKMB, CKMBINDEX, TROPONINI in the last 168 hours. ? ?Lipid Panel: ?No results for input(s): CHOL, TRIG, HDL, CHOLHDL, VLDL, LDLCALC in the last 168 hours. ? ?Imaging: ?CARDIAC CATHETERIZATION ? ?Result Date: 03/19/2022 ?CONCLUSIONS: Severe three-vessel calcification Left main is widely patent Segmental 50 to 80% proximal to mid LAD with abnormal RFR of 0.81 (normal greater than 0. 89). Circumflex significant obstruction OM1 of 90% in the mid to distal third, 95% stenosis in the distal circumflex before OM 3, and total occlusion of OM 2 collateralized from OM 3. Total occlusion of proximal to mid RCA.  RCA collateralized apically and through septal perforators from the LAD. Inferobasal akinesis.  EF 45%.  LVEDP 10 mmHg. RECOMMENDATIONS: Continue medical therapy for ischemia prevention and risk reduction. T CTS consultation to consider  surgery in this 66 year old diabetic with severe three-vessel CAD and decreased EF.   ? ? ?Assessment: 66 year old female with acute mental status change following a lucid interval during recovery post-catheterization ?1. Exam reveals findings most consistent with an agitated delirium. However, a lesional aphasia is also possible.  ?2. STAT CT head ordered but patient would not lie still due to persistent back pain that did not resolve with 12.5 mcg IV fentanyl, nor with inclined back support. Patient also with continued agitation despite 2 mg IV Ativan.  ?3. Unable to safely obtain CT head or CTA with perfusion.  ?4. Highest on the DDx is agitated delirium in the setting of LBP and possible paradoxical side effects of the fentanyl and Ativan that she received for her heart catheterization. Stroke is possible, but significantly less likely. Atypical presentation of seizure is also possible.  ?5. TNK contraindicated in the setting of being unable to obtain CT head to assess for possible hemorrhage.  ? ?Recommendations: ?1. Admit for management of delirium.  ?2. Obtain CT head when patient is able to tolerate ?3. STAT EEG (ordered) ? ?Addendum: ?- CT head: No acute intracranial abnormality. Mild chronic microvascular ischemic change and cerebral volume loss. ?- EEG completed with report pending.  ? ?Electronically signed: Dr. Caryl PinaEric Kiylah Loyer ?03/19/2022, 12:25 PM ? ? ? ?   ?
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
## 2022-03-19 NOTE — Progress Notes (Signed)
Patient unavailable for EEG.  Agitated and "acting out."  2H staff asked that we check with them again in about 20 minutes. ?

## 2022-03-20 ENCOUNTER — Encounter (HOSPITAL_COMMUNITY): Payer: Self-pay | Admitting: Interventional Cardiology

## 2022-03-20 DIAGNOSIS — R410 Disorientation, unspecified: Secondary | ICD-10-CM | POA: Diagnosis not present

## 2022-03-20 DIAGNOSIS — I209 Angina pectoris, unspecified: Secondary | ICD-10-CM | POA: Diagnosis not present

## 2022-03-20 DIAGNOSIS — I2510 Atherosclerotic heart disease of native coronary artery without angina pectoris: Secondary | ICD-10-CM

## 2022-03-20 DIAGNOSIS — F05 Delirium due to known physiological condition: Secondary | ICD-10-CM

## 2022-03-20 DIAGNOSIS — I25118 Atherosclerotic heart disease of native coronary artery with other forms of angina pectoris: Secondary | ICD-10-CM

## 2022-03-20 DIAGNOSIS — R4182 Altered mental status, unspecified: Secondary | ICD-10-CM | POA: Diagnosis not present

## 2022-03-20 DIAGNOSIS — L8990 Pressure ulcer of unspecified site, unspecified stage: Secondary | ICD-10-CM | POA: Insufficient documentation

## 2022-03-20 DIAGNOSIS — T8189XA Other complications of procedures, not elsewhere classified, initial encounter: Secondary | ICD-10-CM

## 2022-03-20 LAB — BASIC METABOLIC PANEL
Anion gap: 12 (ref 5–15)
BUN: 11 mg/dL (ref 8–23)
CO2: 22 mmol/L (ref 22–32)
Calcium: 8.8 mg/dL — ABNORMAL LOW (ref 8.9–10.3)
Chloride: 102 mmol/L (ref 98–111)
Creatinine, Ser: 0.88 mg/dL (ref 0.44–1.00)
GFR, Estimated: >60 mL/min (ref >60)
Glucose, Bld: 136 mg/dL — ABNORMAL HIGH (ref 70–99)
Potassium: 3.9 mmol/L (ref 3.5–5.1)
Sodium: 136 mmol/L (ref 135–145)

## 2022-03-20 LAB — CBC
HCT: 33.3 % — ABNORMAL LOW (ref 36.0–46.0)
Hemoglobin: 10.5 g/dL — ABNORMAL LOW (ref 12.0–15.0)
MCH: 26.7 pg (ref 26.0–34.0)
MCHC: 31.5 g/dL (ref 30.0–36.0)
MCV: 84.7 fL (ref 80.0–100.0)
Platelets: 325 10*3/uL (ref 150–400)
RBC: 3.93 MIL/uL (ref 3.87–5.11)
RDW: 14.6 % (ref 11.5–15.5)
WBC: 7.4 10*3/uL (ref 4.0–10.5)
nRBC: 0 % (ref 0.0–0.2)

## 2022-03-20 LAB — GLUCOSE, CAPILLARY
Glucose-Capillary: 125 mg/dL — ABNORMAL HIGH (ref 70–99)
Glucose-Capillary: 177 mg/dL — ABNORMAL HIGH (ref 70–99)
Glucose-Capillary: 257 mg/dL — ABNORMAL HIGH (ref 70–99)
Glucose-Capillary: 257 mg/dL — ABNORMAL HIGH (ref 70–99)
Glucose-Capillary: 70 mg/dL (ref 70–99)
Glucose-Capillary: 90 mg/dL (ref 70–99)

## 2022-03-20 NOTE — Evaluation (Signed)
Physical Therapy Evaluation ?Patient Details ?Name: Angel Reynolds ?MRN: [id number] ?DOB: 12-12-1955 ?Today's Date: 03/20/2022 ? ?History of Present Illness ? Patient is a 66 y/o female who presents for elective heart cath on 5/15, noted to have AMS post cath resulting in code stroke being called. Stroke ruled out. Admitted for acutely agitated delirium in the setting of LBP and possible paradoxical side effects of the fentanyl and Ativan that she received for her heart catheterization. Cath showed showed severe calcific three-vessel disease (total occlusion of proximal to mid RCA with left to right collateral, 80% mid LAD, 70% mid to distal OM1, 95% distal circumflex). PMH includes DM, anxiety, peripheral neuropathy, HTN,  ?Clinical Impression ? Patient presents with drowsiness, generalized weakness, decreased activity tolerance and impaired mobility s/p above. Pt lives at home alone and is independent for ADLs/IADLs at baseline; does not drive. Today, pt requires Min A for transfers and gait training with use of RW for support. Limited distance due to fatigue and weakness. Recommend using RW at home until strength improves. Pt declining any follow up therapy services even though she could use some HHPT. Has an ex-husband which she reports can be her support. Will consult mobility techs to see patient to increase activity while in the hospital to prepare for d/c home tomorrow. Will plan for stair training next session. ? ?   ? ?Recommendations for follow up therapy are one component of a multi-disciplinary discharge planning process, led by the attending physician.  Recommendations may be updated based on patient status, additional functional criteria and insurance authorization. ? ?Follow Up Recommendations No PT follow up (pt declining follow up services) ? ?  ?Assistance Recommended at Discharge Intermittent Supervision/Assistance  ?Patient can return home with the following ? A little help with walking and/or  transfers;A little help with bathing/dressing/bathroom;Assistance with cooking/housework;Assist for transportation;Help with stairs or ramp for entrance ? ?  ?Equipment Recommendations None recommended by PT  ?Recommendations for Other Services ?    ?  ?Functional Status Assessment Patient has had a recent decline in their functional status and demonstrates the ability to make significant improvements in function in a reasonable and predictable amount of time.  ? ?  ?Precautions / Restrictions Precautions ?Precautions: Fall;Other (comment) ?Precaution Comments: watch BP ?Restrictions ?Weight Bearing Restrictions: No  ? ?  ? ?Mobility ? Bed Mobility ?  ?  ?  ?  ?  ?  ?  ?General bed mobility comments: Up in chair upon PT arrival. ?  ? ?Transfers ?Overall transfer level: Needs assistance ?Equipment used: None ?Transfers: Sit to/from Stand ?Sit to Stand: Min assist ?  ?  ?  ?  ?  ?General transfer comment: MIn A to steady in standing, placed RW in front of her for support. ?  ? ?Ambulation/Gait ?Ambulation/Gait assistance: Min assist ?Gait Distance (Feet): 32 Feet ?Assistive device: Rolling walker (2 wheels) ?Gait Pattern/deviations: Step-through pattern, Decreased stride length, Trunk flexed ?Gait velocity: decreased ?Gait velocity interpretation: <1.31 ft/sec, indicative of household ambulator ?  ?General Gait Details: Slow, mildly unsteady gait with pt leaning on forearms on walker handle despite cues to stand upright, fatigues. Reports feeling woozy. noted to have BP drop. ? ?Stairs ?  ?  ?  ?  ?  ? ?Wheelchair Mobility ?  ? ?Modified Rankin (Stroke Patients Only) ?  ? ?  ? ?Balance Overall balance assessment: Needs assistance ?Sitting-balance support: Feet supported, No upper extremity supported ?Sitting balance-Leahy Scale: Fair ?  ?  ?Standing balance support: During  functional activity, Reliant on assistive device for balance, Single extremity supported ?Standing balance-Leahy Scale: Poor ?Standing balance  comment: Requires UE support ?  ?  ?  ?  ?  ?  ?  ?  ?  ?  ?  ?   ? ? ? ?Pertinent Vitals/Pain Pain Assessment ?Pain Assessment: No/denies pain  ? ? ?Home Living Family/patient expects to be discharged to:: Private residence ?Living Arrangements: Alone ?Available Help at Discharge: Family;Available PRN/intermittently ?Type of Home: Mobile home ?Home Access: Stairs to enter ?Entrance Stairs-Rails: Right;Left ?Entrance Stairs-Number of Steps: 3 ?  ?Home Layout: One level ?Home Equipment: Agricultural consultant (2 wheels);Crutches ?   ?  ?Prior Function Prior Level of Function : Independent/Modified Independent ?  ?  ?  ?  ?  ?  ?Mobility Comments: Independent, does not drive. ?ADLs Comments: independent ?  ? ? ?Hand Dominance  ?   ? ?  ?Extremity/Trunk Assessment  ? Upper Extremity Assessment ?Upper Extremity Assessment: Defer to OT evaluation ?  ? ?Lower Extremity Assessment ?Lower Extremity Assessment: Generalized weakness (but functional, hx of peripheral neuropathy) ?  ? ?   ?Communication  ? Communication: No difficulties  ?Cognition Arousal/Alertness: Lethargic ?Behavior During Therapy: [facility] for tasks assessed/performed ?Overall Cognitive Status: Difficult to assess ?Area of Impairment: Problem solving, Attention, Memory, Safety/judgement ?  ?  ?  ?  ?  ?  ?  ?  ?  ?Current Attention Level: Sustained ?Memory: Decreased short-term memory ?  ?Safety/Judgement: Decreased awareness of deficits, Decreased awareness of safety ?  ?Problem Solving: Slow processing ?General Comments: Does not recall what happened post cath; somewhat sleepy today. Slow processing. oriented x3. ?  ?  ? ?  ?General Comments General comments (skin integrity, edema, etc.): BP pre activity 129/62, post activity BP 109/60, symptomatic with walking ? ?  ?Exercises    ? ?Assessment/Plan  ?  ?PT Assessment Patient needs continued PT services  ?PT Problem List Decreased strength;Decreased mobility;Decreased cognition;Decreased balance;Decreased activity  tolerance;Cardiopulmonary status limiting activity;Decreased safety awareness ? ?   ?  ?PT Treatment Interventions Therapeutic exercise;Gait training;Patient/family education;Therapeutic activities;Functional mobility training;Stair training;Balance training   ? ?PT Goals (Current goals can be found in the Care Plan section)  ?Acute Rehab PT Goals ?Patient Stated Goal: to go home ?PT Goal Formulation: With patient ?Time For Goal Achievement: 04/03/22 ?Potential to Achieve Goals: Good ? ?  ?Frequency Min 3X/week ?  ? ? ?Co-evaluation   ?  ?  ?  ?  ? ? ?  ?AM-PAC PT "6 Clicks" Mobility  ?Outcome Measure Help needed turning from your back to your side while in a flat bed without using bedrails?: A Little ?Help needed moving from lying on your back to sitting on the side of a flat bed without using bedrails?: A Little ?Help needed moving to and from a bed to a chair (including a wheelchair)?: A Little ?Help needed standing up from a chair using your arms (e.g., wheelchair or bedside chair)?: A Little ?Help needed to walk in hospital room?: Total ?Help needed climbing 3-5 steps with a railing? : A Lot ?6 Click Score: 15 ? ?  ?End of Session Equipment Utilized During Treatment: Gait belt ?Activity Tolerance: Patient limited by fatigue ?Patient left: in chair;with call bell/phone within reach;with chair alarm set;with family/visitor present ?Nurse Communication: Mobility status;Other (comment) (BP drop) ?PT Visit Diagnosis: Muscle weakness (generalized) (M62.81);Difficulty in walking, not elsewhere classified (R26.2);Unsteadiness on feet (R26.81) ?  ? ?Time: 1308-6578 ?PT Time Calculation (min) (ACUTE  ONLY): 15 min ? ? ?Charges:   PT Evaluation ?$PT Eval Moderate Complexity: 1 Mod ?  ?  ?   ? ? ?Vale Haven, PT, DPT ?Acute Rehabilitation Services ?Secure chat preferred ?Office [phone] ? ? ? ? ?Blake Divine A Marlon Vonruden ?03/20/2022, 12:11 PM ? ?
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
## 2022-03-20 NOTE — Progress Notes (Signed)
Pt transferred to [facility], RN at bedside during transfer, family at bedside updated, pt remains nsr on tele at the time of transfer. No distress. ?
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
## 2022-03-20 NOTE — Progress Notes (Signed)
?  Mobility Specialist Criteria Algorithm Info. ? ? ? 03/20/22 1139  ?Mobility  ?Activity Ambulated with assistance to bathroom;Ambulated with assistance in room ?(in chair before and after ambulation)  ?Range of Motion/Exercises Active;All extremities  ?Level of Assistance Minimal assist, patient does 75% or more  ?Assistive Device Other (Comment) ?(HHA)  ?Distance Ambulated (ft) 20 ft  ?Activity Response Tolerated well  ? ?Patient received in chair agreeable to participate in mobility. Requested assistance to bathroom but deferred hallway ambulation. Ambulated in room min A with slow gait. Returned to chair without complaint or incident. Was left with all needs met, call bell in reach.  ? ?03/20/2022 ?5:10 PM ? ?[place] , CMS, BS EXP ?Acute Rehabilitation Services  ?SFKCL:[phone] ?Office: [phone] ? ?

## 2022-03-20 NOTE — Procedures (Signed)
Patient Name: Angel Reynolds  ?MRN: [id number]  ?Epilepsy Attending: Charlsie Quest  ?Referring Physician/Provider: Jeanella Craze, NP ?Date: 03/19/2022 ?Duration: 22.23 mins ? ?Patient history:  66 year old female with acute mental status change following a lucid interval during recovery post-catheterization.  EEG to evaluate for seizure. ? ?Level of alertness: lethargic  ? ?AEDs during EEG study: None ? ?Technical aspects: This EEG study was done with scalp electrodes positioned according to the 10-20 International system of electrode placement. Electrical activity was acquired at a sampling rate of 500Hz  and reviewed with a high frequency filter of 70Hz  and a low frequency filter of 1Hz . EEG data were recorded continuously and digitally stored.  ? ?Description: EEG showed continuous generalized 3 to 6 Hz theta-delta slowing. Hyperventilation and photic stimulation were not performed.    ? ?ABNORMALITY ?- Continuous slow, generalized ? ?IMPRESSION: ?This study is suggestive of moderate to severe diffuse encephalopathy, nonspecific etiology. No seizures or epileptiform discharges were seen throughout the recording. ? ?  ? ?

## 2022-03-20 NOTE — Progress Notes (Signed)
Bedside swallowing evaluation done, pt given ice chips and sips of water, no coughing noted, pt swallowed everything with no signs of aspiration. Given a cup of water, pt drank all of it without difficulty. ?

## 2022-03-20 NOTE — Progress Notes (Signed)
Subjective: ?Agitation with delirium now resolved. ? ?Objective: ?Current vital signs: ?BP (!) 144/72   Pulse 83   Temp 98.3 ?F (36.8 ?C) (Oral)   Resp (!) 22   Ht 5\' 2"  (1.575 m)   Wt 84 kg   SpO2 100%   BMI 33.87 kg/m?  ?Vital signs in last 24 hours: ?Temp:  [98.3 ?F (36.8 ?C)-100.1 ?F (37.8 ?C)] 98.3 ?F (36.8 ?C) (05/16 0700) ?Pulse Rate:  [60-91] 83 (05/16 0900) ?Resp:  [11-32] 22 (05/16 0900) ?BP: (104-165)/(55-113) 144/72 (05/16 0900) ?SpO2:  [91 %-100 %] 100 % (05/16 0900) ?Weight:  [84 kg] 84 kg (05/16 0500) ? ?Intake/Output from previous day: ?05/15 0701 - 05/16 0700 ?In: 134.9 [I.V.:134.9] ?Out: 600 [Urine:600] ?Intake/Output this shift: ?No intake/output data recorded. ?Nutritional status:  ?Diet Order   ? ?       ?  Diet Carb Modified Fluid consistency: Thin; Room service appropriate? Yes  Diet effective now       ?  ? ?  ?  ? ?  ? ?HEENT: [place]/AT ?Lungs: Respirations unlabored ?Ext: Warm and well perfused ? ?Neurologic Exam: ?Ment: Awake with mildly sedated affect and decreased level of alertness. Fully oriented except for stating that the current day is "Monday". Speech fluent but sparse. No dysarthria. Naming intact for all 5 fingers. Able to recite the months of the year forwards, but stopped at October when attempting to do so backwards.  ?CN: EOMI. Face symmetric. Phonation intact.  ?Motor: 5/5 x 4 ?Sensory: Intact to FT x 4 ?Reflexes: Normoactive.  ?Cerebellar: No ataxia with FNF bilaterally  ? ?Lab Results: ?Results for orders placed or performed during the hospital encounter of 03/19/22 (from the past 48 hour(s))  ?Glucose, capillary     Status: Abnormal  ? Collection Time: 03/19/22  7:02 AM  ?Result Value Ref Range  ? Glucose-Capillary 136 (H) 70 - 99 mg/dL  ?  Comment: Glucose reference range applies only to samples taken after fasting for at least 8 hours.  ?POCT Activated clotting time     Status: None  ? Collection Time: 03/19/22 10:20 AM  ?Result Value Ref Range  ? Activated Clotting  Time 221 seconds  ?  Comment: Reference range 74-137 seconds for patients not on anticoagulant therapy.  ?Glucose, capillary     Status: Abnormal  ? Collection Time: 03/19/22 11:42 AM  ?Result Value Ref Range  ? Glucose-Capillary 158 (H) 70 - 99 mg/dL  ?  Comment: Glucose reference range applies only to samples taken after fasting for at least 8 hours.  ?APTT     Status: Abnormal  ? Collection Time: 03/19/22 12:58 PM  ?Result Value Ref Range  ? aPTT 136 (H) 24 - 36 seconds  ?  Comment:        ?IF BASELINE aPTT IS ELEVATED, ?SUGGEST PATIENT RISK ASSESSMENT ?BE USED TO DETERMINE APPROPRIATE ?ANTICOAGULANT THERAPY. ?Performed at [facility], [place] [address], [place], [place] [address] ?  ?HIV Antibody (routine testing w rflx)     Status: None  ? Collection Time: 03/19/22  6:18 PM  ?Result Value Ref Range  ? HIV Screen 4th Generation wRfx Non Reactive Non Reactive  ?  Comment: Performed at [facility], [place] [address] [place],  [address]  ?Comprehensive metabolic panel     Status: Abnormal  ? Collection Time: 03/19/22  6:18 PM  ?Result Value Ref Range  ? Sodium 138 135 - 145 mmol/L  ? Potassium 5.0 3.5 - 5.1  mmol/L  ? Chloride 103 98 - 111 mmol/L  ? CO2 23 22 - 32 mmol/L  ? Glucose, Bld 191 (H) 70 - 99 mg/dL  ?  Comment: Glucose reference range applies only to samples taken after fasting for at least 8 hours.  ? BUN 13 8 - 23 mg/dL  ? Creatinine, Ser 1.02 (H) 0.44 - 1.00 mg/dL  ? Calcium 9.1 8.9 - 10.3 mg/dL  ? Total Protein 7.2 6.5 - 8.1 g/dL  ? Albumin 4.0 3.5 - 5.0 g/dL  ? AST 24 15 - 41 U/L  ? ALT 25 0 - 44 U/L  ? Alkaline Phosphatase 44 38 - 126 U/L  ? Total Bilirubin 0.8 0.3 - 1.2 mg/dL  ? GFR, Estimated >60 >60 mL/min  ?  Comment: (NOTE) ?Calculated using the CKD-EPI Creatinine Equation (2021) ?  ? Anion gap 12 5 - 15  ?  Comment: Performed at [facility], [place] [address] [place], [place] [address]  ?CBC with Differential/Platelet     Status: Abnormal  ? Collection Time: 03/19/22  6:18  PM  ?Result Value Ref Range  ? WBC 9.1 4.0 - 10.5 K/uL  ? RBC 4.05 3.87 - 5.11 MIL/uL  ? Hemoglobin 10.9 (L) 12.0 - 15.0 g/dL  ? HCT 34.6 (L) 36.0 - 46.0 %  ? MCV 85.4 80.0 - 100.0 fL  ? MCH 26.9 26.0 - 34.0 pg  ? MCHC 31.5 30.0 - 36.0 g/dL  ? RDW 14.5 11.5 - 15.5 %  ? Platelets 315 150 - 400 K/uL  ? nRBC 0.0 0.0 - 0.2 %  ? Neutrophils Relative % 80 %  ? Neutro Abs 7.1 1.7 - 7.7 K/uL  ? Lymphocytes Relative 14 %  ? Lymphs Abs 1.3 0.7 - 4.0 K/uL  ? Monocytes Relative 6 %  ? Monocytes Absolute 0.6 0.1 - 1.0 K/uL  ? Eosinophils Relative 0 %  ? Eosinophils Absolute 0.0 0.0 - 0.5 K/uL  ? Basophils Relative 0 %  ? Basophils Absolute 0.0 0.0 - 0.1 K/uL  ? Immature Granulocytes 0 %  ? Abs Immature Granulocytes 0.04 0.00 - 0.07 K/uL  ?  Comment: Performed at [facility], [place] [address], [place], [place] [address]  ?Hemoglobin A1c     Status: Abnormal  ? Collection Time: 03/19/22  6:18 PM  ?Result Value Ref Range  ? Hgb A1c MFr Bld 8.2 (H) 4.8 - 5.6 %  ?  Comment: (NOTE) ?Pre diabetes:          5.7%-6.4% ? ?Diabetes:              >6.4% ? ?Glycemic control for   <7.0% ?adults with diabetes ?  ? Mean Plasma Glucose 188.64 mg/dL  ?  Comment: Performed at [facility], [place] [address], [place],  [address]  ?Glucose, capillary     Status: Abnormal  ? Collection Time: 03/19/22  7:54 PM  ?Result Value Ref Range  ? Glucose-Capillary 195 (H) 70 - 99 mg/dL  ?  Comment: Glucose reference range applies only to samples taken after fasting for at least 8 hours.  ?MRSA Next Gen by PCR, Nasal     Status: None  ? Collection Time: 03/19/22  9:17 PM  ? Specimen: Nasal Mucosa; Nasal Swab  ?Result Value Ref Range  ? MRSA by PCR Next Gen NOT DETECTED NOT DETECTED  ?  Comment: (NOTE) ?The GeneXpert MRSA Assay (FDA approved for NASAL specimens only), ?is one component of a comprehensive  MRSA colonization surveillance ?program. It is not intended to diagnose MRSA infection nor to guide ?or monitor treatment for MRSA  infections. ?Test performance is not FDA approved in patients less than 2 years ?old. ?Performed at [facility], [place] [address], [place], [place] ?[address] ?  ?Glucose, capillary     Status: Abnormal  ? Collection Time: 03/19/22 11:09 PM  ?Result Value Ref Range  ? Glucose-Capillary 172 (H) 70 - 99 mg/dL  ?  Comment: Glucose reference range applies only to samples taken after fasting for at least 8 hours.  ?CBC     Status: Abnormal  ? Collection Time: 03/20/22  1:59 AM  ?Result Value Ref Range  ? WBC 7.4 4.0 - 10.5 K/uL  ? RBC 3.93 3.87 - 5.11 MIL/uL  ? Hemoglobin 10.5 (L) 12.0 - 15.0 g/dL  ? HCT 33.3 (L) 36.0 - 46.0 %  ? MCV 84.7 80.0 - 100.0 fL  ? MCH 26.7 26.0 - 34.0 pg  ? MCHC 31.5 30.0 - 36.0 g/dL  ? RDW 14.6 11.5 - 15.5 %  ? Platelets 325 150 - 400 K/uL  ? nRBC 0.0 0.0 - 0.2 %  ?  Comment: Performed at [facility], [place] [address], [place], [place] [address]  ?Basic metabolic panel     Status: Abnormal  ? Collection Time: 03/20/22  1:59 AM  ?Result Value Ref Range  ? Sodium 136 135 - 145 mmol/L  ? Potassium 3.9 3.5 - 5.1 mmol/L  ?  Comment: DELTA CHECK NOTED  ? Chloride 102 98 - 111 mmol/L  ? CO2 22 22 - 32 mmol/L  ? Glucose, Bld 136 (H) 70 - 99 mg/dL  ?  Comment: Glucose reference range applies only to samples taken after fasting for at least 8 hours.  ? BUN 11 8 - 23 mg/dL  ? Creatinine, Ser 0.88 0.44 - 1.00 mg/dL  ? Calcium 8.8 (L) 8.9 - 10.3 mg/dL  ? GFR, Estimated >60 >60 mL/min  ?  Comment: (NOTE) ?Calculated using the CKD-EPI Creatinine Equation (2021) ?  ? Anion gap 12 5 - 15  ?  Comment: Performed at [facility], [place] [address], [place], [place] [address]  ?Glucose, capillary     Status: Abnormal  ? Collection Time: 03/20/22  3:30 AM  ?Result Value Ref Range  ? Glucose-Capillary 125 (H) 70 - 99 mg/dL  ?  Comment: Glucose reference range applies only to samples taken after fasting for at least 8 hours.  ?Glucose, capillary     Status: None  ? Collection Time: 03/20/22  7:33 AM   ?Result Value Ref Range  ? Glucose-Capillary 70 70 - 99 mg/dL  ?  Comment: Glucose reference range applies only to samples taken after fasting for at least 8 hours.  ? ? ?Recent Results (from the past 240 hour(s))  ?MRSA Next

## 2022-03-20 NOTE — Progress Notes (Signed)
? ?Progress Note ? ?Patient Name: Angel Reynolds ?Date of Encounter: 03/20/2022 ? ?[facility] Cardiologist: Jenne Campus MD ? ?Subjective  ? ?Patient awake and alert today. Voices no complaints. Family notes she is much more with it today ? ?Inpatient Medications  ?  ?Scheduled Meds: ? aspirin EC  81 mg Oral Daily  ? atorvastatin  80 mg Oral QPM  ? Chlorhexidine Gluconate Cloth  6 each Topical Daily  ? cholecalciferol  1,000 Units Oral Daily  ? escitalopram  10 mg Oral Daily  ? heparin  5,000 Units Subcutaneous Q8H  ?  HYDROmorphone (DILAUDID) injection  2 mg Intravenous Once  ? insulin aspart  0-15 Units Subcutaneous Q4H  ? metoprolol tartrate  25 mg Oral BID  ? sodium chloride flush  3 mL Intravenous Q12H  ? sodium chloride flush  3 mL Intravenous Q12H  ? ?Continuous Infusions: ? sodium chloride    ? dexmedetomidine (PRECEDEX) IV infusion Stopped (03/20/22 0241)  ? ?PRN Meds: ?sodium chloride, acetaminophen, docusate sodium, famotidine, haloperidol lactate, HYDROcodone-acetaminophen, HYDROmorphone (DILAUDID) injection, ondansetron (ZOFRAN) IV, oxyCODONE, polyethylene glycol, sodium chloride flush  ? ?Vital Signs  ?  ?Vitals:  ? 03/20/22 0500 03/20/22 0600 03/20/22 0700 03/20/22 0800  ?BP: 119/62 (!) 165/99 104/69 (!) 141/72  ?Pulse: 74 73 76 76  ?Resp: 20 17 17 18   ?Temp:   98.3 ?F (36.8 ?C)   ?TempSrc:   Oral   ?SpO2: 98% 98% 98% 99%  ?Weight: 84 kg     ?Height:      ? ? ?Intake/Output Summary (Last 24 hours) at 03/20/2022 0850 ?Last data filed at 03/20/2022 0600 ?Gross per 24 hour  ?Intake 134.91 ml  ?Output 600 ml  ?Net -465.09 ml  ? ? ?  03/20/2022  ?  5:00 AM 03/19/2022  ?  6:59 AM 03/14/2022  ? 10:11 AM  ?Last 3 Weights  ?Weight (lbs) 185 lb 3 oz 187 lb 187 lb 12.8 oz  ?Weight (kg) 84 kg 84.823 kg 85.186 kg  ?   ? ?Telemetry  ?  ?NSR - Personally Reviewed ? ?ECG  ?  ?NSR with LAD and LVH with repolarization abnormality - Personally Reviewed ? ?Physical Exam  ? ?GEN: No acute distress.   ?Neck: No  JVD ?Cardiac: RRR, no murmurs, rubs, or gallops.  ?Respiratory: Clear to auscultation bilaterally. ?GI: Soft, nontender, non-distended  ?MS: No edema; No deformity. ?Neuro:  Nonfocal. Alert and oriented x 3.  ?Psych: Normal affect  ? ?Labs  ?  ?High Sensitivity Troponin:  No results for input(s): TROPONINIHS in the last 720 hours.   ?Chemistry ?Recent Labs  ?Lab 03/14/22 ?1131 03/19/22 ?1818 03/20/22 ?0159  ?NA 142 138 136  ?K 5.1 5.0 3.9  ?CL 103 103 102  ?CO2 22 23 22   ?GLUCOSE 164* 191* 136*  ?BUN 12 13 11   ?CREATININE 0.94 1.02* 0.88  ?CALCIUM 9.3 9.1 8.8*  ?PROT  --  7.2  --   ?ALBUMIN  --  4.0  --   ?AST  --  24  --   ?ALT  --  25  --   ?ALKPHOS  --  44  --   ?BILITOT  --  0.8  --   ?GFRNONAA  --  >60 >60  ?ANIONGAP  --  12 12  ?  ?Lipids No results for input(s): CHOL, TRIG, HDL, LABVLDL, LDLCALC, CHOLHDL in the last 168 hours.  ?Hematology ?Recent Labs  ?Lab 03/14/22 ?1131 03/19/22 ?1818 03/20/22 ?0159  ?WBC 6.1 9.1 7.4  ?  RBC 4.08 4.05 3.93  ?HGB 10.9* 10.9* 10.5*  ?HCT 34.5 34.6* 33.3*  ?MCV 85 85.4 84.7  ?MCH 26.7 26.9 26.7  ?MCHC 31.6 31.5 31.5  ?RDW 13.9 14.5 14.6  ?PLT 389 315 325  ? ?Thyroid No results for input(s): TSH, FREET4 in the last 168 hours.  ?BNPNo results for input(s): BNP, PROBNP in the last 168 hours.  ?DDimer No results for input(s): DDIMER in the last 168 hours.  ? ?Radiology  ?  ?CT HEAD WO CONTRAST (5MM) ? ?Result Date: 03/19/2022 ?CLINICAL DATA:  Neuro deficit, acute, stroke suspected EXAM: CT HEAD WITHOUT CONTRAST TECHNIQUE: Contiguous axial images were obtained from the base of the skull through the vertex without intravenous contrast. RADIATION DOSE REDUCTION: This exam was performed according to the departmental dose-optimization program which includes automated exposure control, adjustment of the mA and/or kV according to patient size and/or use of iterative reconstruction technique. COMPARISON:  12/14/2014 FINDINGS: Brain: No evidence of acute infarction, hemorrhage,  hydrocephalus, extra-axial collection or mass lesion/mass effect. Scattered low-density changes within the periventricular and subcortical white matter compatible with chronic microvascular ischemic change. Mild diffuse cerebral volume loss. Vascular: Atherosclerotic calcifications involving the large vessels of the skull base. No unexpected hyperdense vessel. Skull: Normal. Negative for fracture or focal lesion. Sinuses/Orbits: No acute finding. Postsurgical changes along the inferior right orbital wall. Other: None. IMPRESSION: 1. No acute intracranial abnormality. 2. Mild chronic microvascular ischemic change and cerebral volume loss. Electronically Signed   By: Davina Poke D.O.   On: 03/19/2022 16:30  ? ?MR ANGIO HEAD WO CONTRAST ? ?Result Date: 03/19/2022 ?CLINICAL DATA:  Stroke, follow up stroke; Neuro deficit, acute, stroke suspected EXAM: MRI HEAD WITHOUT CONTRAST MRA HEAD WITHOUT CONTRAST TECHNIQUE: Multiplanar, multi-echo pulse sequences of the brain and surrounding structures were acquired without intravenous contrast. Angiographic images of the Circle of Willis were acquired using MRA technique without intravenous contrast. COMPARISON:  CT head from the same day. FINDINGS: MRI HEAD FINDINGS Brain: No acute infarction, hemorrhage, hydrocephalus, extra-axial collection or mass lesion. Mild a moderate scattered T2/FLAIR hyperintensities in the white matter, nonspecific but compatible with chronic microvascular disease Vascular: Detailed below. Skull and upper cervical spine: Normal marrow signal. Sinuses/Orbits: Mild paranasal sinus mucosal thickening. No acute orbital findings. Other: Small left mastoid effusions. MRA HEAD FINDINGS Mildly motion limited study. Anterior circulation: Bilateral intracranial ICAs, MCAs, and ACAs are patent without proximal hemodynamically significant stenosis. Small bilateral supraclinoid infundibula. Posterior circulation: Bilateral intradural vertebral arteries, basilar  artery and both posterior cerebral arteries are patent without proximal hemodynamically significant stenosis. Small bilateral posterior communicating arteries are faintly visualized. IMPRESSION: 1. No evidence of acute intracranial abnormality 2. No emergent large vessel occlusion or proximal hemodynamically significant stenosis. 3. Moderate chronic microvascular disease. Electronically Signed   By: Margaretha Sheffield M.D.   On: 03/19/2022 18:10  ? ?MR BRAIN WO CONTRAST ? ?Result Date: 03/19/2022 ?CLINICAL DATA:  Stroke, follow up stroke; Neuro deficit, acute, stroke suspected EXAM: MRI HEAD WITHOUT CONTRAST MRA HEAD WITHOUT CONTRAST TECHNIQUE: Multiplanar, multi-echo pulse sequences of the brain and surrounding structures were acquired without intravenous contrast. Angiographic images of the Circle of Willis were acquired using MRA technique without intravenous contrast. COMPARISON:  CT head from the same day. FINDINGS: MRI HEAD FINDINGS Brain: No acute infarction, hemorrhage, hydrocephalus, extra-axial collection or mass lesion. Mild a moderate scattered T2/FLAIR hyperintensities in the white matter, nonspecific but compatible with chronic microvascular disease Vascular: Detailed below. Skull and upper cervical spine: Normal marrow signal. Sinuses/Orbits:  Mild paranasal sinus mucosal thickening. No acute orbital findings. Other: Small left mastoid effusions. MRA HEAD FINDINGS Mildly motion limited study. Anterior circulation: Bilateral intracranial ICAs, MCAs, and ACAs are patent without proximal hemodynamically significant stenosis. Small bilateral supraclinoid infundibula. Posterior circulation: Bilateral intradural vertebral arteries, basilar artery and both posterior cerebral arteries are patent without proximal hemodynamically significant stenosis. Small bilateral posterior communicating arteries are faintly visualized. IMPRESSION: 1. No evidence of acute intracranial abnormality 2. No emergent large vessel  occlusion or proximal hemodynamically significant stenosis. 3. Moderate chronic microvascular disease. Electronically Signed   By: Margaretha Sheffield M.D.   On: 03/19/2022 18:10  ? ?CARDIAC CATHETERIZATION ? ?Result Date: 5/

## 2022-03-21 DIAGNOSIS — E785 Hyperlipidemia, unspecified: Secondary | ICD-10-CM

## 2022-03-21 DIAGNOSIS — I25118 Atherosclerotic heart disease of native coronary artery with other forms of angina pectoris: Secondary | ICD-10-CM | POA: Diagnosis not present

## 2022-03-21 DIAGNOSIS — I10 Essential (primary) hypertension: Secondary | ICD-10-CM | POA: Diagnosis not present

## 2022-03-21 DIAGNOSIS — E669 Obesity, unspecified: Secondary | ICD-10-CM

## 2022-03-21 DIAGNOSIS — G894 Chronic pain syndrome: Secondary | ICD-10-CM | POA: Diagnosis not present

## 2022-03-21 DIAGNOSIS — E1169 Type 2 diabetes mellitus with other specified complication: Secondary | ICD-10-CM

## 2022-03-21 DIAGNOSIS — R410 Disorientation, unspecified: Secondary | ICD-10-CM | POA: Diagnosis not present

## 2022-03-21 DIAGNOSIS — I209 Angina pectoris, unspecified: Secondary | ICD-10-CM | POA: Diagnosis not present

## 2022-03-21 DIAGNOSIS — F32A Depression, unspecified: Secondary | ICD-10-CM

## 2022-03-21 DIAGNOSIS — E119 Type 2 diabetes mellitus without complications: Secondary | ICD-10-CM

## 2022-03-21 LAB — GLUCOSE, CAPILLARY
Glucose-Capillary: 113 mg/dL — ABNORMAL HIGH (ref 70–99)
Glucose-Capillary: 145 mg/dL — ABNORMAL HIGH (ref 70–99)

## 2022-03-21 LAB — LIPOPROTEIN A (LPA): Lipoprotein (a): 342.6 nmol/L — ABNORMAL HIGH (ref <75.0)

## 2022-03-21 NOTE — Progress Notes (Signed)
OT Cancellation Note ? ?Patient Details ?Name: Angel Reynolds ?MRN: [id number] ?DOB: [birth year] ? ? ?Cancelled Treatment:    Reason Eval/Treat Not Completed: OT screened, no needs identified, will sign off.  Pt is independent for selfcare tasks and functional mobility.   ? ?Kalsey Lull OTR/L ?03/21/2022, 10:30 AM ?

## 2022-03-21 NOTE — Plan of Care (Signed)
  Problem: Education: Goal: Knowledge of General Education information will improve Description Including pain rating scale, medication(s)/side effects and non-pharmacologic comfort measures Outcome: Progressing   Problem: Health Behavior/Discharge Planning: Goal: Ability to manage health-related needs will improve Outcome: Progressing   Problem: Clinical Measurements: Goal: Diagnostic test results will improve Outcome: Progressing   Problem: Pain Managment: Goal: General experience of comfort will improve Outcome: Progressing   

## 2022-03-21 NOTE — Discharge Summary (Signed)
?Physician Discharge Summary ?  ?Patient: AAMINAH WIEBENGA MRN: [id number] DOB: [birth year]  ?Admit date:     03/19/2022  ?Discharge date: 03/21/22  ?Discharge Physician: Alberteen Sam  ? ?PCP: Simone Curia, MD  ? ? ? ?Recommendations at discharge:  ?Follow-up with cardiothoracic surgery as instructed for evaluation for coronary bypass surgery as an outpatient ?Follow-up with PCP Dr. Nedra Hai in 1 week ?Dr. Nedra Hai: Given delirium in setting of Vicodin use, please review risk/benefit of further long-term opiate use with patient  ? ? ? ? ?Discharge Diagnoses: ?Principal Problem: ?  Agitated delirium ?Active Problems: ?  Angina pectoris (HCC) ?  Benign essential hypertension ?  Type 2 diabetes mellitus with obesity (HCC) ?  Hyperlipidemia ?  Coronary artery disease completely occluded RCA, significant obtuse marginal 1 disease, borderline mid LAD based on coronary CT angio from 2023 ?  Pressure injury of skin, stage 2, coccyx, present on admission ?  Obesity, BMI 33 ?  Depression ?  Chronic pain syndrome ? ?  ? ? ?Hospital Course: ?Mrs. Rafalski is a 66 y.o. F with HTN, DM, CAD, obesity, and chronic pain syndrome on daily Vicodin who presented in her usual state of health 5/15 for planned left heart cath. ? ?Expectation is to discharge the patient from short stay, but shortly after catheterization, she appeared to have staring, slurred speech, disorientation, and agitation.  Code stroke was called and she was urgently evaluated by neurology and obtain CT head that was unremarkable.  She was transferred to the ICU and placed on Precedex. ? ?Subsequent work-up by neurology with neuraxial imaging and EEG showed no evidence of stroke, intracranial hemorrhage, seizures.  Further psychoactive medicines were held, and her mentation returned to baseline. ? ?The cause of her confusion appeared to be withdrawal from daily Vicodin in the setting of Versed use for her procedure. ? ?Her left heart cath showed complex coronary disease,  and cardiology recommended outpatient cardiothoracic surgery consultation for CABG. ? ? ? ?  ? ? ? ? ? ?[facility] Controlled Substances Registry was reviewed for this patient prior to discharge. ? ?Consultants: Critical care, cardiology ?Procedures performed: Left heart cath, EEG, MRI brain, MR angiogram of the head and neck ?Disposition: Home ?Diet recommendation:  ?Discharge Diet Orders (From admission, onward)  ? ?  Start     Ordered  ? 03/21/22 0000  Diet - low sodium heart healthy       ? 03/21/22 0856  ? ?  ?  ? ?  ? ? ? ?DISCHARGE MEDICATION: ?Allergies as of 03/21/2022   ? ?   Reactions  ? Mobic [meloxicam] Palpitations  ? "heart flutters"   ? ?  ? ?  ?Medication List  ?  ? ?TAKE these medications   ? ?aspirin 81 MG tablet ?Take 81 mg by mouth daily. ?  ?atorvastatin 80 MG tablet ?Commonly known as: LIPITOR ?Take 80 mg by mouth every evening. ?  ?Cholecalciferol 25 MCG (1000 UT) capsule ?Take 1,000 Units by mouth daily. ?  ?clotrimazole-betamethasone cream ?Commonly known as: LOTRISONE ?Apply 1 application. topically daily as needed (Eczema). ?  ?escitalopram 10 MG tablet ?Commonly known as: LEXAPRO ?Take 10 mg by mouth daily. ?  ?famotidine 20 MG tablet ?Commonly known as: PEPCID ?Take 20 mg by mouth daily as needed for heartburn or indigestion. ?  ?glimepiride 4 MG tablet ?Commonly known as: AMARYL ?Take 4 mg by mouth 2 (two) times daily. ?  ?HYDROcodone-acetaminophen 5-325 MG tablet ?Commonly known as: NORCO/VICODIN ?  Take 1 tablet by mouth every 8 (eight) hours as needed for pain. ?  ?isosorbide mononitrate 30 MG 24 hr tablet ?Commonly known as: IMDUR ?Take 1 tablet (30 mg total) by mouth daily. ?  ?lisinopril 10 MG tablet ?Commonly known as: ZESTRIL ?Take 1 tablet (10 mg total) by mouth daily. ?  ?metFORMIN 850 MG tablet ?Commonly known as: GLUCOPHAGE ?Take 850 mg by mouth 3 (three) times daily. ?  ?metoprolol tartrate 25 MG tablet ?Commonly known as: LOPRESSOR ?Take 1 tablet (25 mg total) by  mouth 2 (two) times daily. ?  ?nitroGLYCERIN 0.4 MG SL tablet ?Commonly known as: NITROSTAT ?Place 1 tablet (0.4 mg total) under the tongue every 5 (five) minutes as needed for chest pain. ?  ?traMADol 50 MG tablet ?Commonly known as: ULTRAM ?Take 50 mg by mouth 3 (three) times daily as needed for pain. ?  ? ?  ? ? ? ?Discharge Instructions   ? ? Diet - low sodium heart healthy   Complete by: As directed ?  ? Discharge instructions   Complete by: As directed ?  ? You were admitted for a heart catheterization. ?After the catheterization, you had to be kept in the hospital for delirium. ?The delirium was likely due to the daily Vicodin you take, in the setting of the medicines you got for anesthesia for the catheterization. ? ?Regardless, it has resolved. ? ?Resume your home medicines as you were taking them before. ? ?You need to go see the Cardiac Surgeon Dr. Donata Clay on May 22 at 11AM (See below in the "To Do" Section) ? ?You also need to follow up with your heart specialist, Dr. Bing Matter, at your scheduled appointment (in July, see below)  ? Increase activity slowly   Complete by: As directed ?  ? No wound care   Complete by: As directed ?  ? ?  ? ? ?Discharge Exam: ?Filed Weights  ? 03/19/22 0659 03/20/22 0500 03/21/22 0400  ?Weight: 84.8 kg 84 kg 82.2 kg  ? ? ?General: Pt is alert, awake, not in acute distress ?Cardiovascular: RRR, nl S1-S2, no murmurs appreciated.   No LE edema.   ?Respiratory: Normal respiratory rate and rhythm.  CTAB without rales or wheezes. ?Abdominal: Abdomen soft and non-tender.  No distension or HSM.   ?Neuro/Psych: Strength symmetric in upper and lower extremities.  Judgment and insight appear normal. ? ? ?Condition at discharge: good ? ?The results of significant diagnostics from this hospitalization (including imaging, microbiology, ancillary and laboratory) are listed below for reference.  ? ?Imaging Studies: ?CT HEAD WO CONTRAST ( ) ? ?Result Date: 03/19/2022 ?CLINICAL DATA:   Neuro deficit, acute, stroke suspected EXAM: CT HEAD WITHOUT CONTRAST TECHNIQUE: Contiguous axial images were obtained from the base of the skull through the vertex without intravenous contrast. RADIATION DOSE REDUCTION: This exam was performed according to the departmental dose-optimization program which includes automated exposure control, adjustment of the mA and/or kV according to patient size and/or use of iterative reconstruction technique. COMPARISON:  12/14/2014 FINDINGS: Brain: No evidence of acute infarction, hemorrhage, hydrocephalus, extra-axial collection or mass lesion/mass effect. Scattered low-density changes within the periventricular and subcortical white matter compatible with chronic microvascular ischemic change. Mild diffuse cerebral volume loss. Vascular: Atherosclerotic calcifications involving the large vessels of the skull base. No unexpected hyperdense vessel. Skull: Normal. Negative for fracture or focal lesion. Sinuses/Orbits: No acute finding. Postsurgical changes along the inferior right orbital wall. Other: None. IMPRESSION: 1. No acute intracranial abnormality. 2. Mild chronic microvascular ischemic  change and cerebral volume loss. Electronically Signed   By: Duanne Guess D.O.   On: 03/19/2022 16:30  ? ?MR ANGIO HEAD WO CONTRAST ? ?Result Date: 03/19/2022 ?CLINICAL DATA:  Stroke, follow up stroke; Neuro deficit, acute, stroke suspected EXAM: MRI HEAD WITHOUT CONTRAST MRA HEAD WITHOUT CONTRAST TECHNIQUE: Multiplanar, multi-echo pulse sequences of the brain and surrounding structures were acquired without intravenous contrast. Angiographic images of the Circle of Willis were acquired using MRA technique without intravenous contrast. COMPARISON:  CT head from the same day. FINDINGS: MRI HEAD FINDINGS Brain: No acute infarction, hemorrhage, hydrocephalus, extra-axial collection or mass lesion. Mild a moderate scattered T2/FLAIR hyperintensities in the white matter, nonspecific but  compatible with chronic microvascular disease Vascular: Detailed below. Skull and upper cervical spine: Normal marrow signal. Sinuses/Orbits: Mild paranasal sinus mucosal thickening. No acute orbital findings. O

## 2022-03-21 NOTE — Plan of Care (Signed)
?  Problem: Education: ?Goal: Knowledge of General Education information will improve ?Description: Including pain rating scale, medication(s)/side effects and non-pharmacologic comfort measures ?03/21/2022 0116 by Annett Fabian, RN ?Outcome: Progressing ?03/21/2022 0058 by Annett Fabian, RN ?Outcome: Progressing ?  ?Problem: Health Behavior/Discharge Planning: ?Goal: Ability to manage health-related needs will improve ?03/21/2022 0116 by Annett Fabian, RN ?Outcome: Progressing ?03/21/2022 0058 by Annett Fabian, RN ?Outcome: Progressing ?  ?Problem: Clinical Measurements: ?Goal: Diagnostic test results will improve ?Outcome: Progressing ?Goal: Cardiovascular complication will be avoided ?Outcome: Progressing ?  ?Problem: Pain Managment: ?Goal: General experience of comfort will improve ?03/21/2022 0116 by Annett Fabian, RN ?Outcome: Progressing ?03/21/2022 0058 by Annett Fabian, RN ?Outcome: Progressing ?  ?

## 2022-03-21 NOTE — Progress Notes (Signed)
Physical Therapy Treatment ?Patient Details ?Name: Angel Reynolds ?MRN: [id number] ?DOB: 1956-10-28 ?Today's Date: 03/21/2022 ? ? ?History of Present Illness Pt is a 66 y.o. female admitted 03/19/22 for elective heart cath; pt with AMS post-cath resulting in code stroke being called; stroke ruled out. Admitted for acutely agitated delirium in the setting of LBP and possible paradoxical side effects of the fentanyl and Ativan she received for procedure. Cath showed showed severe three-vessel disease (total occlusion of proximal to mid RCA with left to right collateral, 80% mid LAD, 70% mid to distal OM1, 95% distal circumflex); planned CT surgery evaluation as outpatient.  PMH includes DM, anxiety, peripheral neuropathy, HTN. ?  ?PT Comments  ? ? Pt progressing with mobility, preparing for d/c home. Pt able to ambulate and perform ADL tasks in room at supervision-level; requires seated rest breaks. Pt declined gait training with rollator or stair training. Pt and ex-husband educ re: DME needs, fall risk reduction, home modifications and fall risk reduction. Pt reports no further questions or concerns. If to remain admitted, will continue to follow acutely.  ?   ?Recommendations for follow up therapy are one component of a multi-disciplinary discharge planning process, led by the attending physician.  Recommendations may be updated based on patient status, additional functional criteria and insurance authorization. ? ?Follow Up Recommendations ? No PT follow up ?  ?  ?Assistance Recommended at Discharge Intermittent Supervision/Assistance  ?Patient can return home with the following A little help with bathing/dressing/bathroom;Assistance with cooking/housework;Assist for transportation;Help with stairs or ramp for entrance ?  ?Equipment Recommendations ? None recommended by PT  ?  ?Recommendations for Other Services   ? ? ?  ?Precautions / Restrictions Precautions ?Precautions: Fall ?Restrictions ?Weight Bearing  Restrictions: No  ?  ? ?Mobility ? Bed Mobility ?Overal bed mobility: Independent ?  ?  ?  ?  ?  ?  ?  ?  ? ?Transfers ?Overall transfer level: Needs assistance ?Equipment used: None ?Transfers: Sit to/from Stand ?Sit to Stand: Supervision ?  ?  ?  ?  ?  ?General transfer comment: able to stand from EOB and toilet without assist, supervision for safety ?  ? ?Ambulation/Gait ?Ambulation/Gait assistance: Supervision ?Gait Distance (Feet): 40 Feet ?Assistive device: None ?Gait Pattern/deviations: Step-through pattern, Decreased stride length, Trunk flexed ?Gait velocity: Decreased ?  ?  ?General Gait Details: ambulating throughout room without assist, supervision for safety; pt declines gait training with rollator ? ? ?Stairs ?  ?  ?  ?  ?  ? ? ?Wheelchair Mobility ?  ? ?Modified Rankin (Stroke Patients Only) ?  ? ? ?  ?Balance Overall balance assessment: Needs assistance ?Sitting-balance support: Feet supported, No upper extremity supported ?Sitting balance-Leahy Scale: Good ?Sitting balance - Comments: indep with pericare in bathroom and dressing seated EOB ?  ?Standing balance support: No upper extremity supported, During functional activity ?Standing balance-Leahy Scale: Fair ?  ?  ?  ?  ?  ?  ?  ?  ?  ?  ?  ?  ?  ? ?  ?Cognition Arousal/Alertness: Awake/alert ?Behavior During Therapy: [facility] for tasks assessed/performed ?Overall Cognitive Status: Within Functional Limits for tasks assessed ?  ?  ?  ?  ?  ?  ?  ?  ?  ?  ?  ?  ?  ?  ?  ?  ?General Comments: WFL for simple tasks, not formally assessed ?  ?  ? ?  ?Exercises   ? ?  ?General  Comments General comments (skin integrity, edema, etc.): pt preparing for d/c home this morning, ex-husband present. increased time discussing safe d/c home, including DME needs, home modifications, fall risk reduction. pt declines gait training with rollator despite educ on its use for added stability and energy conservation; pt declined stair training, uses UE support on trashcans  and doorway, ex-husband reports he will be present to assist up stairs into home. pt plans to go to ex-husband's mother's home for access to walk-in shower with seat, due to difficulty stepping over her own tub ?  ?  ? ?Pertinent Vitals/Pain Pain Assessment ?Pain Assessment: No/denies pain ?Pain Intervention(s): Monitored during session  ? ? ?Home Living Family/patient expects to be discharged to:: Private residence ?Living Arrangements: Alone;Spouse/significant other ?  ?  ?  ?  ?  ?  ?  ?  ?   ?  ?Prior Function    ?  ?  ?   ? ?PT Goals (current goals can now be found in the care plan section) Progress towards PT goals: Progressing toward goals ? ?  ?Frequency ? ? ? Min 3X/week ? ? ? ?  ?PT Plan Current plan remains appropriate  ? ? ?Co-evaluation   ?  ?  ?  ?  ? ?  ?AM-PAC PT "6 Clicks" Mobility   ?Outcome Measure ? Help needed turning from your back to your side while in a flat bed without using bedrails?: None ?Help needed moving from lying on your back to sitting on the side of a flat bed without using bedrails?: None ?Help needed moving to and from a bed to a chair (including a wheelchair)?: A Little ?Help needed standing up from a chair using your arms (e.g., wheelchair or bedside chair)?: A Little ?Help needed to walk in hospital room?: A Little ?Help needed climbing 3-5 steps with a railing? : A Little ?6 Click Score: 20 ? ?  ?End of Session   ?Activity Tolerance: Patient tolerated treatment well ?Patient left: in bed;with call bell/phone within reach;with family/visitor present;with nursing/sitter in room (preparing for d/c home) ?Nurse Communication: Mobility status ?PT Visit Diagnosis: Muscle weakness (generalized) (M62.81);Difficulty in walking, not elsewhere classified (R26.2);Unsteadiness on feet (R26.81) ?  ? ? ?Time: 5400-8676 ?PT Time Calculation (min) (ACUTE ONLY): 12 min ? ?Charges:  $Self Care/Home Management: 8-22          ?          ? ?Ina Homes, PT, DPT ?Acute Rehabilitation Services   ?Pager [phone] ?Office [phone] ? ?Malachy Chamber ?03/21/2022, 10:57 AM ? ?

## 2022-03-21 NOTE — Assessment & Plan Note (Signed)
Estimated body mass index is 33.58 kg/m as calculated from the following:   Height as of this encounter: 5' (1.524 m).   Weight as of this encounter: 78 kg.   -This will complicate overall prognosis 

## 2022-03-21 NOTE — Hospital Course (Addendum)
The patient presented in her normal state of health on 5/15 for planned LHC.  Post LHC the patient was returned to short stay and was within normal limits. She was to discharge home.  Per RN report, approximately 1 hour post cath the patient began staring off with slurred speech. She later became acutely agitated.  Neurology was consulted for evaluation of possible CODE STROKE. She was sent for urgent CT but when laid on the table became increasingly agitated despite fentanyl and ativan.  He reports she has chronic back pain and takes vicodin "constantly".   ? ?MRI brain with MRA head: No evidence of acute intracranial abnormality. No emergent large vessel occlusion or proximal hemodynamically significant stenosis. Moderate chronic microvascular disease. ?EEG: Continuous slow, generalized. The study is suggestive of moderate to severe diffuse encephalopathy, nonspecific to etiology. No seizures or epileptiform discharges were seen throughout the recording. ?

## 2022-03-21 NOTE — Progress Notes (Signed)
? ?Progress Note ? ?Patient Name: Angel Reynolds ?Date of Encounter: 03/21/2022 ? ?[facility] Cardiologist: Gypsy Balsam MD ? ?Subjective  ? ?Patient awake and alert today. Voices no complaints. Has ambulated. Fully oriented. ? ?Inpatient Medications  ?  ?Scheduled Meds: ? aspirin EC  81 mg Oral Daily  ? atorvastatin  80 mg Oral QPM  ? Chlorhexidine Gluconate Cloth  6 each Topical Daily  ? cholecalciferol  1,000 Units Oral Daily  ? escitalopram  10 mg Oral Daily  ? heparin  5,000 Units Subcutaneous Q8H  ?  HYDROmorphone (DILAUDID) injection  2 mg Intravenous Once  ? insulin aspart  0-15 Units Subcutaneous Q4H  ? metoprolol tartrate  25 mg Oral BID  ? sodium chloride flush  3 mL Intravenous Q12H  ? sodium chloride flush  3 mL Intravenous Q12H  ? ?Continuous Infusions: ? sodium chloride    ? dexmedetomidine (PRECEDEX) IV infusion Stopped (03/20/22 0241)  ? ?PRN Meds: ?sodium chloride, acetaminophen, docusate sodium, famotidine, haloperidol lactate, HYDROcodone-acetaminophen, HYDROmorphone (DILAUDID) injection, ondansetron (ZOFRAN) IV, oxyCODONE, polyethylene glycol, sodium chloride flush  ? ?Vital Signs  ?  ?Vitals:  ? 03/20/22 1952 03/20/22 2300 03/21/22 0352 03/21/22 0400  ?BP: (!) 144/73 127/68 (!) 141/64   ?Pulse: 75 63 70   ?Resp: 20 17 18    ?Temp: 98.1 ?F (36.7 ?C) 98.2 ?F (36.8 ?C) 98.4 ?F (36.9 ?C)   ?TempSrc: Oral Oral Oral   ?SpO2: 96% 96% 93%   ?Weight:    82.2 kg  ?Height:      ? ? ?Intake/Output Summary (Last 24 hours) at 03/21/2022 0715 ?Last data filed at 03/20/2022 2041 ?Gross per 24 hour  ?Intake 600 ml  ?Output --  ?Net 600 ml  ? ? ? ?  03/21/2022  ?  4:00 AM 03/20/2022  ?  5:00 AM 03/19/2022  ?  6:59 AM  ?Last 3 Weights  ?Weight (lbs) 181 lb 3.5 oz 185 lb 3 oz 187 lb  ?Weight (kg) 82.2 kg 84 kg 84.823 kg  ?   ? ?Telemetry  ?  ?NSR - Personally Reviewed ? ?ECG  ?  ?NSR with LAD and LVH with repolarization abnormality - Personally Reviewed ? ?Physical Exam  ? ?GEN: No acute distress.   ?Neck: No  JVD ?Cardiac: RRR, no murmurs, rubs, or gallops.  ?Respiratory: Clear to auscultation bilaterally. ?GI: Soft, nontender, non-distended  ?MS: No edema; No deformity. ?Neuro:  Nonfocal. Alert and oriented x 3.  ?Psych: Normal affect  ? ?Labs  ?  ?High Sensitivity Troponin:  No results for input(s): TROPONINIHS in the last 720 hours.   ?Chemistry ?Recent Labs  ?Lab 03/14/22 ?1131 03/19/22 ?1818 03/20/22 ?0159  ?NA 142 138 136  ?K 5.1 5.0 3.9  ?CL 103 103 102  ?CO2 22 23 22   ?GLUCOSE 164* 191* 136*  ?BUN 12 13 11   ?CREATININE 0.94 1.02* 0.88  ?CALCIUM 9.3 9.1 8.8*  ?PROT  --  7.2  --   ?ALBUMIN  --  4.0  --   ?AST  --  24  --   ?ALT  --  25  --   ?ALKPHOS  --  44  --   ?BILITOT  --  0.8  --   ?GFRNONAA  --  >60 >60  ?ANIONGAP  --  12 12  ? ?  ?Lipids No results for input(s): CHOL, TRIG, HDL, LABVLDL, LDLCALC, CHOLHDL in the last 168 hours.  ?Hematology ?Recent Labs  ?Lab 03/14/22 ?1131 03/19/22 ?1818 03/20/22 ?0159  ?WBC  6.1 9.1 7.4  ?RBC 4.08 4.05 3.93  ?HGB 10.9* 10.9* 10.5*  ?HCT 34.5 34.6* 33.3*  ?MCV 85 85.4 84.7  ?MCH 26.7 26.9 26.7  ?MCHC 31.6 31.5 31.5  ?RDW 13.9 14.5 14.6  ?PLT 389 315 325  ? ? ?Thyroid No results for input(s): TSH, FREET4 in the last 168 hours.  ?BNPNo results for input(s): BNP, PROBNP in the last 168 hours.  ?DDimer No results for input(s): DDIMER in the last 168 hours.  ? ?Radiology  ?  ?CT HEAD WO CONTRAST (5MM) ? ?Result Date: 03/19/2022 ?CLINICAL DATA:  Neuro deficit, acute, stroke suspected EXAM: CT HEAD WITHOUT CONTRAST TECHNIQUE: Contiguous axial images were obtained from the base of the skull through the vertex without intravenous contrast. RADIATION DOSE REDUCTION: This exam was performed according to the departmental dose-optimization program which includes automated exposure control, adjustment of the mA and/or kV according to patient size and/or use of iterative reconstruction technique. COMPARISON:  12/14/2014 FINDINGS: Brain: No evidence of acute infarction, hemorrhage,  hydrocephalus, extra-axial collection or mass lesion/mass effect. Scattered low-density changes within the periventricular and subcortical white matter compatible with chronic microvascular ischemic change. Mild diffuse cerebral volume loss. Vascular: Atherosclerotic calcifications involving the large vessels of the skull base. No unexpected hyperdense vessel. Skull: Normal. Negative for fracture or focal lesion. Sinuses/Orbits: No acute finding. Postsurgical changes along the inferior right orbital wall. Other: None. IMPRESSION: 1. No acute intracranial abnormality. 2. Mild chronic microvascular ischemic change and cerebral volume loss. Electronically Signed   By: Duanne GuessNicholas  Plundo D.O.   On: 03/19/2022 16:30  ? ?MR ANGIO HEAD WO CONTRAST ? ?Result Date: 03/19/2022 ?CLINICAL DATA:  Stroke, follow up stroke; Neuro deficit, acute, stroke suspected EXAM: MRI HEAD WITHOUT CONTRAST MRA HEAD WITHOUT CONTRAST TECHNIQUE: Multiplanar, multi-echo pulse sequences of the brain and surrounding structures were acquired without intravenous contrast. Angiographic images of the Circle of Willis were acquired using MRA technique without intravenous contrast. COMPARISON:  CT head from the same day. FINDINGS: MRI HEAD FINDINGS Brain: No acute infarction, hemorrhage, hydrocephalus, extra-axial collection or mass lesion. Mild a moderate scattered T2/FLAIR hyperintensities in the white matter, nonspecific but compatible with chronic microvascular disease Vascular: Detailed below. Skull and upper cervical spine: Normal marrow signal. Sinuses/Orbits: Mild paranasal sinus mucosal thickening. No acute orbital findings. Other: Small left mastoid effusions. MRA HEAD FINDINGS Mildly motion limited study. Anterior circulation: Bilateral intracranial ICAs, MCAs, and ACAs are patent without proximal hemodynamically significant stenosis. Small bilateral supraclinoid infundibula. Posterior circulation: Bilateral intradural vertebral arteries, basilar  artery and both posterior cerebral arteries are patent without proximal hemodynamically significant stenosis. Small bilateral posterior communicating arteries are faintly visualized. IMPRESSION: 1. No evidence of acute intracranial abnormality 2. No emergent large vessel occlusion or proximal hemodynamically significant stenosis. 3. Moderate chronic microvascular disease. Electronically Signed   By: Feliberto HartsFrederick S Jones M.D.   On: 03/19/2022 18:10  ? ?MR BRAIN WO CONTRAST ? ?Result Date: 03/19/2022 ?CLINICAL DATA:  Stroke, follow up stroke; Neuro deficit, acute, stroke suspected EXAM: MRI HEAD WITHOUT CONTRAST MRA HEAD WITHOUT CONTRAST TECHNIQUE: Multiplanar, multi-echo pulse sequences of the brain and surrounding structures were acquired without intravenous contrast. Angiographic images of the Circle of Willis were acquired using MRA technique without intravenous contrast. COMPARISON:  CT head from the same day. FINDINGS: MRI HEAD FINDINGS Brain: No acute infarction, hemorrhage, hydrocephalus, extra-axial collection or mass lesion. Mild a moderate scattered T2/FLAIR hyperintensities in the white matter, nonspecific but compatible with chronic microvascular disease Vascular: Detailed below. Skull and upper cervical  spine: Normal marrow signal. Sinuses/Orbits: Mild paranasal sinus mucosal thickening. No acute orbital findings. Other: Small left mastoid effusions. MRA HEAD FINDINGS Mildly motion limited study. Anterior circulation: Bilateral intracranial ICAs, MCAs, and ACAs are patent without proximal hemodynamically significant stenosis. Small bilateral supraclinoid infundibula. Posterior circulation: Bilateral intradural vertebral arteries, basilar artery and both posterior cerebral arteries are patent without proximal hemodynamically significant stenosis. Small bilateral posterior communicating arteries are faintly visualized. IMPRESSION: 1. No evidence of acute intracranial abnormality 2. No emergent large vessel  occlusion or proximal hemodynamically significant stenosis. 3. Moderate chronic microvascular disease. Electronically Signed   By: Feliberto Harts M.D.   On: 03/19/2022 18:10  ? ?CARDIAC CATHETERIZATION ? ?[facility]ul

## 2022-03-21 NOTE — Plan of Care (Signed)

## 2022-03-22 ENCOUNTER — Telehealth: Payer: Self-pay

## 2022-03-22 NOTE — Telephone Encounter (Signed)
Unable to reach and have left several messages. Letter mailed requesting a call back.

## 2022-03-22 NOTE — Telephone Encounter (Signed)
-----   Message from Georgeanna Lea, MD sent at 03/16/2022 12:34 PM EDT ----- Labs are looking good, chronic anemia

## 2022-03-26 ENCOUNTER — Institutional Professional Consult (permissible substitution): Payer: Medicare HMO | Admitting: Cardiothoracic Surgery

## 2022-03-26 ENCOUNTER — Encounter: Payer: Self-pay | Admitting: Cardiothoracic Surgery

## 2022-03-26 ENCOUNTER — Other Ambulatory Visit: Payer: Self-pay | Admitting: *Deleted

## 2022-03-26 VITALS — BP 110/65 | HR 77 | Resp 20 | Ht 62.0 in | Wt 181.0 lb

## 2022-03-26 DIAGNOSIS — I25118 Atherosclerotic heart disease of native coronary artery with other forms of angina pectoris: Secondary | ICD-10-CM

## 2022-03-26 NOTE — Progress Notes (Signed)
PCP is Cher Nakai, MD Referring Provider is Park Liter, MD  Chief Complaint  Patient presents with   Coronary Artery Disease    Initial surgical consult, cath 5/15, echo 4/11  Patient examined, images of cardiac catheterization, echocardiogram, and cardiac CT scan personally reviewed and counseled with patient.  HPI: 66 year old diabetic smoker/vape presents after recent diagnosis of severe multivessel diabetic pattern coronary disease with moderate LV dysfunction.  1 week ago she had a cardiac catheterization via radial artery.  This demonstrated the coronary disease with total occlusion the RCA, 75% gnosis of the mid LAD and 75 to 80% stenosis of the mid to distal OM1.  The distal circumflex has diffuse disease in an atretic vessel.  Echocardiogram shows EF of 45 to 50% without significant MR or aortic valve disease.  The patient had a neurologic event associated with a catheterization with slurred speech confusion and decreased level of consciousness.  This eventually resolved.  He CT of the head with contrast was unrevealing.  He was hospitalized for 3 days after cardiac cath.  Since returning home she denies any recurrent chest pain or difficulty with her speech or focal weakness.  She does complain of severe right knee pain which started within the past day and has required her to use a wheelchair.  She cannot walk at this time without assistance.  The patient has been using vape smoking for quite a while but has cut back since she was hospitalized 1 week ago.  The patient's surgical history is positive for right suborbital fracture requiring surgery by plastic surgeon and TAH.  She had an apparent vagal episode upon intubation for the suborbital fracture and became severely bradycardic.  Patient denies claudication but has very poor pulses in her lower extremities.  She is right-hand dominant.  Diabetes has been been suboptimal control with A1c of 8.4.  She is back on her  metformin following her cardiac catheterization.   Past Medical History:  Diagnosis Date   Anxiety    Benign essential hypertension    Chronic constipation    Diabetes mellitus    AODM   Elevated liver enzymes    Hypercholesteremia    Hyperlipidemia    Lower back pain    Obesity    Osteoarthritis of left knee 01/06/2021   Peripheral neuropathy    Recurrent major depressive disorder in partial remission ([place])    Type 2 diabetes mellitus with obesity ([facility])     Past Surgical History:  Procedure Laterality Date   ABDOMINAL HYSTERECTOMY     INTRAVASCULAR PRESSURE WIRE/FFR STUDY N/A 03/19/2022   Procedure: INTRAVASCULAR PRESSURE WIRE/FFR STUDY;  Surgeon: Belva Crome, MD;  Location: [facility];  Service: Cardiovascular;  Laterality: N/A;   LEFT HEART CATH AND CORONARY ANGIOGRAPHY N/A 03/19/2022   Procedure: LEFT HEART CATH AND CORONARY ANGIOGRAPHY;  Surgeon: Belva Crome, MD;  Location: [facility];  Service: Cardiovascular;  Laterality: N/A;    Family History  Problem Relation Age of Onset   Diabetes Father    Diabetes Maternal Uncle     Social History Social History   Tobacco Use   Smoking status: Former    Types: E-cigarettes, Cigarettes   Smokeless tobacco: Never  Vaping Use   Vaping Use: Every day   Substances: Nicotine  Substance Use Topics   Alcohol use: Not Currently   Drug use: Not Currently    Current Outpatient Medications  Medication Sig Dispense Refill   aspirin 81 MG tablet  Take 81 mg by mouth daily.     atorvastatin (LIPITOR) 80 MG tablet Take 80 mg by mouth every evening.     Cholecalciferol 25 MCG (1000 UT) capsule Take 1,000 Units by mouth daily.     clotrimazole-betamethasone (LOTRISONE) cream Apply 1 application. topically daily as needed (Eczema).     escitalopram (LEXAPRO) 10 MG tablet Take 10 mg by mouth daily.     famotidine (PEPCID) 20 MG tablet Take 20 mg by mouth daily as needed for heartburn or indigestion.      glimepiride (AMARYL) 4 MG tablet Take 4 mg by mouth 2 (two) times daily.     HYDROcodone-acetaminophen (NORCO/VICODIN) 5-325 MG tablet Take 1 tablet by mouth every 8 (eight) hours as needed for pain.     isosorbide mononitrate (IMDUR) 30 MG 24 hr tablet Take 1 tablet (30 mg total) by mouth daily. 90 tablet 3   lisinopril (ZESTRIL) 10 MG tablet Take 1 tablet (10 mg total) by mouth daily. 90 tablet 0   metFORMIN (GLUCOPHAGE) 850 MG tablet Take 850 mg by mouth 3 (three) times daily.     metoprolol tartrate (LOPRESSOR) 25 MG tablet Take 1 tablet (25 mg total) by mouth 2 (two) times daily. 180 tablet 3   nitroGLYCERIN (NITROSTAT) 0.4 MG SL tablet Place 1 tablet (0.4 mg total) under the tongue every 5 (five) minutes as needed for chest pain. 35 tablet 2   traMADol (ULTRAM) 50 MG tablet Take 50 mg by mouth 3 (three) times daily as needed for pain.     No current facility-administered medications for this visit.    Allergies  Allergen Reactions   Mobic [Meloxicam] Palpitations    "heart flutters"     Review of Systems:      She has severe longstanding lower back pain and takes Vicodin almost every day. She lives by herself but her ex-husband is very close by.  He is supportive and drives for her.          Review of Systems :  [ y ] = yes, [  ] = no        General :  Weight gain [   ]    Weight loss  [   ]  Fatigue [  ]  Fever [  ]  Chills  [  ]                                          HEENT    Headache [  ]  Dizziness [  ]  Blurred vision [  ] Glaucoma  [  ]                          Nosebleeds [  ] Painful or loose teeth [  ]        Cardiac :  Chest pain/ pressure [ x ]  Resting SOB [  ] exertional SOB [  x]                        Orthopnea [  ]  Pedal edema  [  ]  Palpitations [  ] Syncope/presyncope [ ]                         Paroxysmal nocturnal dyspnea [  ]  Pulmonary : cough [ x ]  wheezing [  ]  Hemoptysis [  ] Sputum [  ] Snoring [  ]                               Pneumothorax [  ]  Sleep apnea [  ]        GI : Vomiting [  ]  Dysphagia [  ]  Melena  [  ]  Abdominal pain [  ] BRBPR [  ]              Heart burn [  ]  Constipation [ x ] Diarrhea  [  ] Colonoscopy [   ]        GU : Hematuria [  ]  Dysuria [  ]  Nocturia [  ] UTI's [  ]        Vascular : Claudication [  ]  Rest pain [  ]  DVT [  ] Vein stripping [  ] leg ulcers [  ]                          TIA [  ] Stroke [  ]  Varicose veins [  ]        NEURO :  Headaches  [  ] Seizures [  ] Vision changes [  ] Paresthesias [  ]                                               Musculoskeletal :  Arthritis [  x] Gout  [  ]  Back pain [ x ]  Joint pain [ x ]        Skin :  Rash [  ]  Melanoma [  ] Sores [  ]        Heme : Bleeding problems [  ]Clotting Disorders [  ] Anemia [  ]Blood Transfusion [ ]         Endocrine : Diabetes [  ] Heat or Cold intolerance [  ] Polyuria [  ]excessive thirst [ ]         Psych : Depression [  x]  Anxiety [  ]  Psych hospitalizations [  ] Memory change [  ]                                                                            BP 110/65 (BP Location: Right Arm, Patient Position: Sitting)   Pulse 77   Resp 20   Ht 5\' 2"  (1.575 m)   Wt 181 lb (82.1 kg)   SpO2 95% Comment: RA  BMI 33.11 kg/m  Physical Exam:       Physical Exam  General: Overweight chronically ill and frail appearing female in wheelchair accompanied by her ex-husband. HEENT: Normocephalic pupils equal , dentition adequate Neck: Supple without JVD, adenopathy, or bruit Chest: Scattered rhonchi to auscultation, symmetrical breath sounds, no tenderness  or deformity Cardiovascular: Regular rate and rhythm, no murmur, no gallop, peripheral pulses not             palpable in all extremities Abdomen:  Soft, nontender, no palpable mass or organomegaly Extremities: Warm, well-perfused, no clubbing cyanosis edema or tenderness,              no venous stasis changes of the  legs Rectal/GU: Deferred Neuro: Grossly non--focal and symmetrical throughout Skin: Clean and dry without rash or ulceration   Diagnostic Tests: Coronary angiograms personally reviewed showing significant multivessel coronary disease.  Echocardiogram images personally viewed showing mild to moderate LV dysfunction without valvular disease  Impression: Three-vessel coronary artery disease in a diabetic pattern LV dysfunction mild-moderate Poorly controlled diabetes Significant arthritis of the back and knees History of depression  Plan: The patient has significant coronary disease with reduced LV function and would benefit from coronary bypass surgery.  However she is a suboptimal candidate. #1 she is currently unable to walk due to severe arthritis in her right knee.  She is currently in a wheelchair.  She will set up an appointment to be seen by her orthopedic surgeon for possible steroid injections so that she can walk. #2 she is still recovering from a neurologic event following cardiac catheterization only 1 week ago.  Although the scans are negative feel she still needs recovery time from probable plaque embolus before major heart surgery. #3 she has been a heavy smoker-vape use and needs to be completely off for at least 2 weeks prior surgery to reduce the risk of poststernotomy pneumonia.  Patient be set up for vein mapping to help document that she will have adequate conduit.  She will return in approximately 2 weeks for reassessment and possible scheduling surgical date after the orthopedic assessment and treatment and vein mapping images are completed. Dahlia Byes, MD [facility] [phone]

## 2022-03-27 ENCOUNTER — Ambulatory Visit (HOSPITAL_COMMUNITY)
Admission: RE | Admit: 2022-03-27 | Discharge: 2022-03-27 | Disposition: A | Discharge status: Home / Self Care | Payer: Medicare HMO | Source: Ambulatory Visit | Attending: Cardiothoracic Surgery | Admitting: Cardiothoracic Surgery

## 2022-03-27 DIAGNOSIS — Z01810 Encounter for preprocedural cardiovascular examination: Secondary | ICD-10-CM | POA: Diagnosis not present

## 2022-03-27 DIAGNOSIS — I25118 Atherosclerotic heart disease of native coronary artery with other forms of angina pectoris: Secondary | ICD-10-CM | POA: Insufficient documentation

## 2022-03-27 DIAGNOSIS — M1711 Unilateral primary osteoarthritis, right knee: Secondary | ICD-10-CM | POA: Diagnosis not present

## 2022-03-28 DIAGNOSIS — E1169 Type 2 diabetes mellitus with other specified complication: Secondary | ICD-10-CM | POA: Diagnosis not present

## 2022-03-28 DIAGNOSIS — I10 Essential (primary) hypertension: Secondary | ICD-10-CM | POA: Diagnosis not present

## 2022-03-28 DIAGNOSIS — E669 Obesity, unspecified: Secondary | ICD-10-CM | POA: Diagnosis not present

## 2022-03-28 DIAGNOSIS — I25110 Atherosclerotic heart disease of native coronary artery with unstable angina pectoris: Secondary | ICD-10-CM | POA: Diagnosis not present

## 2022-03-28 DIAGNOSIS — R748 Abnormal levels of other serum enzymes: Secondary | ICD-10-CM | POA: Diagnosis not present

## 2022-03-28 DIAGNOSIS — E7800 Pure hypercholesterolemia, unspecified: Secondary | ICD-10-CM | POA: Diagnosis not present

## 2022-03-28 DIAGNOSIS — M545 Low back pain, unspecified: Secondary | ICD-10-CM | POA: Diagnosis not present

## 2022-03-28 DIAGNOSIS — K5909 Other constipation: Secondary | ICD-10-CM | POA: Diagnosis not present

## 2022-03-28 DIAGNOSIS — G629 Polyneuropathy, unspecified: Secondary | ICD-10-CM | POA: Diagnosis not present

## 2022-04-06 DIAGNOSIS — M1712 Unilateral primary osteoarthritis, left knee: Secondary | ICD-10-CM | POA: Diagnosis not present

## 2022-04-09 ENCOUNTER — Ambulatory Visit: Payer: Medicare HMO | Admitting: Cardiothoracic Surgery

## 2022-04-09 ENCOUNTER — Encounter: Payer: Self-pay | Admitting: Cardiothoracic Surgery

## 2022-04-09 VITALS — BP 124/73 | HR 70 | Resp 20 | Ht 62.0 in

## 2022-04-09 DIAGNOSIS — I25118 Atherosclerotic heart disease of native coronary artery with other forms of angina pectoris: Secondary | ICD-10-CM

## 2022-04-09 NOTE — Progress Notes (Signed)
   Subjective:    Patient ID: Angel Reynolds, female    DOB: 1956/04/25, 66 y.o.   MRN: [id number]  HPI The patient returns for discussion of coronary bypass surgery following catheterization last month showing three-vessel disease with mild to moderate LV dysfunction.  The right coronary is chronically occluded and not an adequate target for grafting.  The distal circumflex has stenosis but is suboptimal for grafting.  There is a large circumflex marginal which is patent.  The LAD has 75% stenosis and is graftable.  The patient stopped smoking for initial consultation.  She is taking Ozempic weekly injections for diabetes control and weight loss. She denies any chest pain but she is having some abdominal bloating discomfort she relates to Ozempic.  The patient underwent vein mapping as part of her preoperative evaluation.  This shows inadequate bilateral saphenous vein measuring only 1 to 2 mm in diameter.  The patient presents today in a wheelchair but she is able to walk slightly around the room after having her knees injected since her initial visit.  Review of Systems No chest pain, ankle edema, orthopnea.    Objective:   Physical Exam Regular heart rate No murmur or gallop Lungs with slightly distant breath sounds No ankle edema       Assessment & Plan:  I believe that the patient should be medically treated for her coronary disease.  With nongraftable RCA and distal circumflex and no adequate saphenous vein conduit she would not gain any benefit from CABG.  She is on good medical therapy now with statin, beta-blocker, Imdur and lisinopril.  She has stopped smoking.  She is working on her diabetes control with Ozempic.  If her symptoms of pain become worse then consideration of PCI to the LAD would be appropriate.

## 2022-04-11 DIAGNOSIS — K5909 Other constipation: Secondary | ICD-10-CM | POA: Diagnosis not present

## 2022-04-11 DIAGNOSIS — I10 Essential (primary) hypertension: Secondary | ICD-10-CM | POA: Diagnosis not present

## 2022-04-11 DIAGNOSIS — M545 Low back pain, unspecified: Secondary | ICD-10-CM | POA: Diagnosis not present

## 2022-04-11 DIAGNOSIS — I25110 Atherosclerotic heart disease of native coronary artery with unstable angina pectoris: Secondary | ICD-10-CM | POA: Diagnosis not present

## 2022-04-11 DIAGNOSIS — E1169 Type 2 diabetes mellitus with other specified complication: Secondary | ICD-10-CM | POA: Diagnosis not present

## 2022-04-11 DIAGNOSIS — G629 Polyneuropathy, unspecified: Secondary | ICD-10-CM | POA: Diagnosis not present

## 2022-04-11 DIAGNOSIS — F3341 Major depressive disorder, recurrent, in partial remission: Secondary | ICD-10-CM | POA: Diagnosis not present

## 2022-04-11 DIAGNOSIS — E669 Obesity, unspecified: Secondary | ICD-10-CM | POA: Diagnosis not present

## 2022-04-11 DIAGNOSIS — E7800 Pure hypercholesterolemia, unspecified: Secondary | ICD-10-CM | POA: Diagnosis not present

## 2022-04-25 DIAGNOSIS — G629 Polyneuropathy, unspecified: Secondary | ICD-10-CM | POA: Diagnosis not present

## 2022-04-25 DIAGNOSIS — I25110 Atherosclerotic heart disease of native coronary artery with unstable angina pectoris: Secondary | ICD-10-CM | POA: Diagnosis not present

## 2022-04-25 DIAGNOSIS — M545 Low back pain, unspecified: Secondary | ICD-10-CM | POA: Diagnosis not present

## 2022-04-25 DIAGNOSIS — E7800 Pure hypercholesterolemia, unspecified: Secondary | ICD-10-CM | POA: Diagnosis not present

## 2022-04-25 DIAGNOSIS — E669 Obesity, unspecified: Secondary | ICD-10-CM | POA: Diagnosis not present

## 2022-04-25 DIAGNOSIS — K5909 Other constipation: Secondary | ICD-10-CM | POA: Diagnosis not present

## 2022-04-25 DIAGNOSIS — I10 Essential (primary) hypertension: Secondary | ICD-10-CM | POA: Diagnosis not present

## 2022-04-25 DIAGNOSIS — R748 Abnormal levels of other serum enzymes: Secondary | ICD-10-CM | POA: Diagnosis not present

## 2022-04-25 DIAGNOSIS — E1169 Type 2 diabetes mellitus with other specified complication: Secondary | ICD-10-CM | POA: Diagnosis not present

## 2022-05-10 ENCOUNTER — Encounter: Payer: Self-pay | Admitting: Cardiology

## 2022-05-10 ENCOUNTER — Ambulatory Visit: Payer: Medicare HMO | Admitting: Cardiology

## 2022-05-10 VITALS — BP 110/50 | HR 75 | Ht 61.5 in | Wt 175.8 lb

## 2022-05-10 DIAGNOSIS — I10 Essential (primary) hypertension: Secondary | ICD-10-CM

## 2022-05-10 DIAGNOSIS — E782 Mixed hyperlipidemia: Secondary | ICD-10-CM | POA: Diagnosis not present

## 2022-05-10 DIAGNOSIS — E669 Obesity, unspecified: Secondary | ICD-10-CM | POA: Diagnosis not present

## 2022-05-10 DIAGNOSIS — I209 Angina pectoris, unspecified: Secondary | ICD-10-CM | POA: Diagnosis not present

## 2022-05-10 DIAGNOSIS — Z6832 Body mass index (BMI) 32.0-32.9, adult: Secondary | ICD-10-CM | POA: Diagnosis not present

## 2022-05-10 DIAGNOSIS — E1169 Type 2 diabetes mellitus with other specified complication: Secondary | ICD-10-CM

## 2022-05-10 DIAGNOSIS — I25118 Atherosclerotic heart disease of native coronary artery with other forms of angina pectoris: Secondary | ICD-10-CM | POA: Diagnosis not present

## 2022-05-10 DIAGNOSIS — Z87891 Personal history of nicotine dependence: Secondary | ICD-10-CM | POA: Diagnosis not present

## 2022-05-10 DIAGNOSIS — E119 Type 2 diabetes mellitus without complications: Secondary | ICD-10-CM | POA: Diagnosis not present

## 2022-05-10 NOTE — Progress Notes (Signed)
Cardiology Office Note:    Date:  05/10/2022   ID:  LEAHMARIE Reynolds, DOB [birth year], MRN [id number]  PCP:  Simone Curia, MD  Cardiologist:  Gypsy Balsam, MD    Referring MD: Simone Curia, MD   Chief Complaint  Patient presents with   Follow-up   Medication Refill    Metoprolol    History of Present Illness:    Angel Reynolds is a 66 y.o. female with past medical history significant for multiple risk factors for coronary artery disease which include diabetes, dyslipidemia, essential hypertension, smoking.  She did have coronary CT angio which showed completely gone right coronary artery with significant lesion obtuse marginal branch as well as pulm lesion in mid LAD.  After that she had cardiac catheterization performed which showed 80 to 90% proximal LAD, 90% obtuse marginal branch, complete occluded RCA with collateralization.  After cardiac catheterization she had some neurological event she was confused, extensive neurological testing has been performed and conclusion was that was probably withdrawal from Vicodin.  She was seen by cardiothoracic surgeon for possibility of coronary bypass graft.  She is supposed to see them back however patient tells me that she had vascular studies done to her veins and all veins are small and basically nothing can be done for her overall she is doing well.  She described to have her episode of chest pain.  Some fatigue tiredness but otherwise doing well.  Abstain from smoking.  I did review notes from surgeon.  Conclusion is the distal RCA support  On top of that she does have poor saphenous potential grafts therefore medical therapy has been recommended.  We doing this so far.  If she have more symptomatology then we can consider PTCA and stenting of the LAD as well as ophthalmology branch.  In the matter-of-fact I will ask our interventional colleagues to review her cardiac catheterization to see if this is stentable lesions.  Past Medical History:   Diagnosis Date   Anxiety    Benign essential hypertension    Chronic constipation    Diabetes mellitus    AODM   Elevated liver enzymes    Hypercholesteremia    Hyperlipidemia    Lower back pain    Obesity    Osteoarthritis of left knee 01/06/2021   Peripheral neuropathy    Recurrent major depressive disorder in partial remission (HCC)    Type 2 diabetes mellitus with obesity (HCC)     Past Surgical History:  Procedure Laterality Date   ABDOMINAL HYSTERECTOMY     INTRAVASCULAR PRESSURE WIRE/FFR STUDY N/A 03/19/2022   Procedure: INTRAVASCULAR PRESSURE WIRE/FFR STUDY;  Surgeon: Lyn Records, MD;  Location: [facility];  Service: Cardiovascular;  Laterality: N/A;   LEFT HEART CATH AND CORONARY ANGIOGRAPHY N/A 03/19/2022   Procedure: LEFT HEART CATH AND CORONARY ANGIOGRAPHY;  Surgeon: Lyn Records, MD;  Location: [facility];  Service: Cardiovascular;  Laterality: N/A;    Current Medications: Current Meds  Medication Sig   aspirin 81 MG tablet Take 81 mg by mouth daily.   atorvastatin (LIPITOR) 80 MG tablet Take 80 mg by mouth every evening.   Cholecalciferol 25 MCG (1000 UT) capsule Take 1,000 Units by mouth daily.   clotrimazole-betamethasone (LOTRISONE) cream Apply 1 application. topically daily as needed (Eczema).   escitalopram (LEXAPRO) 10 MG tablet Take 10 mg by mouth daily.   famotidine (PEPCID) 20 MG tablet Take 20 mg by mouth daily as needed for heartburn or indigestion.  glimepiride (AMARYL) 4 MG tablet Take 4 mg by mouth 2 (two) times daily.   HYDROcodone-acetaminophen (NORCO/VICODIN) 5-325 MG tablet Take 1 tablet by mouth every 8 (eight) hours as needed for pain.   isosorbide mononitrate (IMDUR) 30 MG 24 hr tablet Take 1 tablet (30 mg total) by mouth daily.   lisinopril (ZESTRIL) 10 MG tablet Take 1 tablet (10 mg total) by mouth daily.   metFORMIN (GLUCOPHAGE) 850 MG tablet Take 850 mg by mouth 3 (three) times daily.   metoprolol tartrate  (LOPRESSOR) 25 MG tablet Take 1 tablet (25 mg total) by mouth 2 (two) times daily.   nitroGLYCERIN (NITROSTAT) 0.4 MG SL tablet Place 1 tablet (0.4 mg total) under the tongue every 5 (five) minutes as needed for chest pain.   Semaglutide,0.25 or 0.5MG /DOS, (OZEMPIC, 0.25 OR 0.5 MG/DOSE,) 2 MG/3ML SOPN Inject 1 mg into the skin once a week. Injects once weekly   traMADol (ULTRAM) 50 MG tablet Take 50 mg by mouth 3 (three) times daily as needed for pain.     Allergies:   Mobic [meloxicam]   Social History   Socioeconomic History   Marital status: Married    Spouse name: Not on file   Number of children: Not on file   Years of education: Not on file   Highest education level: Not on file  Occupational History   Not on file  Tobacco Use   Smoking status: Former    Types: E-cigarettes, Cigarettes   Smokeless tobacco: Never  Vaping Use   Vaping Use: Every day   Substances: Nicotine  Substance and Sexual Activity   Alcohol use: Not Currently   Drug use: Not Currently   Sexual activity: Not on file  Other Topics Concern   Not on file  Social History Narrative   Not on file   Social Determinants of Health   Financial Resource Strain: Not on file  Food Insecurity: Not on file  Transportation Needs: Not on file  Physical Activity: Not on file  Stress: Not on file  Social Connections: Not on file     Family History: The patient's family history includes Diabetes in her father and maternal uncle. ROS:   Please see the history of present illness.    All 14 point review of systems negative except as described per history of present illness  EKGs/Labs/Other Studies Reviewed:      Recent Labs: 03/19/2022: ALT 25 03/20/2022: BUN 11; Creatinine, Ser 0.88; Hemoglobin 10.5; Platelets 325; Potassium 3.9; Sodium 136  Recent Lipid Panel No results found for: "CHOL", "TRIG", "HDL", "CHOLHDL", "VLDL", "LDLCALC", "LDLDIRECT"  Physical Exam:    VS:  BP (!) 110/50 (BP Location: Left  Arm, Patient Position: Sitting)   Pulse 75   Ht 5' 1.5" (1.562 m)   Wt 175 lb 12.8 oz (79.7 kg)   SpO2 95%   BMI 32.68 kg/m     Wt Readings from Last 3 Encounters:  05/10/22 175 lb 12.8 oz (79.7 kg)  03/26/22 181 lb (82.1 kg)  03/21/22 181 lb 3.5 oz (82.2 kg)     GEN:  Well nourished, well developed in no acute distress HEENT: Normal NECK: No JVD; No carotid bruits LYMPHATICS: No lymphadenopathy CARDIAC: RRR, no murmurs, no rubs, no gallops RESPIRATORY:  Clear to auscultation without rales, wheezing or rhonchi  ABDOMEN: Soft, non-tender, non-distended MUSCULOSKELETAL:  No edema; No deformity  SKIN: Warm and dry LOWER EXTREMITIES: no swelling NEUROLOGIC:  Alert and oriented x 3 PSYCHIATRIC:  Normal affect  ASSESSMENT:    1. Atherosclerosis of native coronary artery of native heart with stable angina pectoris (HCC)   2. Benign essential hypertension   3. Angina pectoris (HCC)   4. Type 2 diabetes mellitus with obesity (HCC)   5. Mixed hyperlipidemia    PLAN:    In order of problems listed above:  Coronary disease triple-vessel with diminished ejection fraction.  Should be candidate for coronary bypass graft and the veins in the lower extremities are very small.  However the question is can we do hybrid procedure including PTCA of obtuse marginal branch and LIMA graft to LAD or should we simply do PTCA and stenting of both arteries and leave the right coronary artery alone.  I will try to send message to surgeons to see if there are any alternatives in terms of grafting can we use radial artery for grafting. Essential hypertension blood pressure well controlled today Angina pectoris stable pattern Type 2 diabetes followed by internal medicine team.  Her hemoglobin A1c today was 8.2 obesity poorly controlled need to do better job in diet. Smoking she abstain from smoking   Medication Adjustments/Labs and Tests Ordered: Current medicines are reviewed at length with the  patient today.  Concerns regarding medicines are outlined above.  No orders of the defined types were placed in this encounter.  Medication changes: No orders of the defined types were placed in this encounter.   Signed, Georgeanna Lea, MD, [facility] 05/10/2022 1:09 PM    [facility]

## 2022-05-10 NOTE — Patient Instructions (Signed)
Medication Instructions:  Your physician recommends that you continue on your current medications as directed. Please refer to the Current Medication list given to you today.  *If you need a refill on your cardiac medications before your next appointment, please call your pharmacy*   Lab Work: NONE If you have labs (blood work) drawn today and your tests are completely normal, you will receive your results only by: MyChart Message (if you have MyChart) OR A paper copy in the mail If you have any lab test that is abnormal or we need to change your treatment, we will call you to review the results.   Testing/Procedures: NONE   Follow-Up: At [facility], you and your health needs are our priority.  As part of our continuing mission to provide you with exceptional heart care, we have created designated Provider Care Teams.  These Care Teams include your primary Cardiologist (physician) and Advanced Practice Providers (APPs -  Physician Assistants and Nurse Practitioners) who all work together to provide you with the care you need, when you need it.  We recommend signing up for the patient portal called "MyChart".  Sign up information is provided on this After Visit Summary.  MyChart is used to connect with patients for Virtual Visits (Telemedicine).  Patients are able to view lab/test results, encounter notes, upcoming appointments, etc.  Non-urgent messages can be sent to your provider as well.   To learn more about what you can do with MyChart, go to [email or web address].    Your next appointment:   2 month(s)  The format for your next appointment:   In Person  Provider:   Robert Krasowski, MD    Other Instructions   Important Information About Sugar       

## 2022-05-14 ENCOUNTER — Other Ambulatory Visit: Payer: Self-pay | Admitting: Cardiology

## 2022-05-23 DIAGNOSIS — I10 Essential (primary) hypertension: Secondary | ICD-10-CM | POA: Diagnosis not present

## 2022-05-23 DIAGNOSIS — E1169 Type 2 diabetes mellitus with other specified complication: Secondary | ICD-10-CM | POA: Diagnosis not present

## 2022-05-23 DIAGNOSIS — Z6830 Body mass index (BMI) 30.0-30.9, adult: Secondary | ICD-10-CM | POA: Diagnosis not present

## 2022-05-23 DIAGNOSIS — I25110 Atherosclerotic heart disease of native coronary artery with unstable angina pectoris: Secondary | ICD-10-CM | POA: Diagnosis not present

## 2022-05-23 DIAGNOSIS — E669 Obesity, unspecified: Secondary | ICD-10-CM | POA: Diagnosis not present

## 2022-05-23 DIAGNOSIS — K5909 Other constipation: Secondary | ICD-10-CM | POA: Diagnosis not present

## 2022-06-20 DIAGNOSIS — I10 Essential (primary) hypertension: Secondary | ICD-10-CM | POA: Diagnosis not present

## 2022-06-20 DIAGNOSIS — E7800 Pure hypercholesterolemia, unspecified: Secondary | ICD-10-CM | POA: Diagnosis not present

## 2022-06-20 DIAGNOSIS — E669 Obesity, unspecified: Secondary | ICD-10-CM | POA: Diagnosis not present

## 2022-06-20 DIAGNOSIS — E1169 Type 2 diabetes mellitus with other specified complication: Secondary | ICD-10-CM | POA: Diagnosis not present

## 2022-06-20 DIAGNOSIS — R5382 Chronic fatigue, unspecified: Secondary | ICD-10-CM | POA: Diagnosis not present

## 2022-06-20 DIAGNOSIS — I25110 Atherosclerotic heart disease of native coronary artery with unstable angina pectoris: Secondary | ICD-10-CM | POA: Diagnosis not present

## 2022-06-20 DIAGNOSIS — F419 Anxiety disorder, unspecified: Secondary | ICD-10-CM | POA: Diagnosis not present

## 2022-06-20 DIAGNOSIS — F3341 Major depressive disorder, recurrent, in partial remission: Secondary | ICD-10-CM | POA: Diagnosis not present

## 2022-06-20 DIAGNOSIS — R748 Abnormal levels of other serum enzymes: Secondary | ICD-10-CM | POA: Diagnosis not present

## 2022-07-18 DIAGNOSIS — F419 Anxiety disorder, unspecified: Secondary | ICD-10-CM | POA: Diagnosis not present

## 2022-07-18 DIAGNOSIS — M545 Low back pain, unspecified: Secondary | ICD-10-CM | POA: Diagnosis not present

## 2022-07-18 DIAGNOSIS — F3341 Major depressive disorder, recurrent, in partial remission: Secondary | ICD-10-CM | POA: Diagnosis not present

## 2022-07-18 DIAGNOSIS — E7800 Pure hypercholesterolemia, unspecified: Secondary | ICD-10-CM | POA: Diagnosis not present

## 2022-07-18 DIAGNOSIS — E1169 Type 2 diabetes mellitus with other specified complication: Secondary | ICD-10-CM | POA: Diagnosis not present

## 2022-07-18 DIAGNOSIS — E669 Obesity, unspecified: Secondary | ICD-10-CM | POA: Diagnosis not present

## 2022-07-18 DIAGNOSIS — I25110 Atherosclerotic heart disease of native coronary artery with unstable angina pectoris: Secondary | ICD-10-CM | POA: Diagnosis not present

## 2022-07-18 DIAGNOSIS — I10 Essential (primary) hypertension: Secondary | ICD-10-CM | POA: Diagnosis not present

## 2022-07-18 DIAGNOSIS — R748 Abnormal levels of other serum enzymes: Secondary | ICD-10-CM | POA: Diagnosis not present

## 2022-07-25 ENCOUNTER — Encounter: Payer: Self-pay | Admitting: Cardiology

## 2022-07-25 ENCOUNTER — Ambulatory Visit: Payer: Medicare HMO | Attending: Cardiology | Admitting: Cardiology

## 2022-07-25 VITALS — BP 124/54 | HR 78 | Ht 62.0 in | Wt 171.4 lb

## 2022-07-25 DIAGNOSIS — I10 Essential (primary) hypertension: Secondary | ICD-10-CM | POA: Diagnosis not present

## 2022-07-25 DIAGNOSIS — E782 Mixed hyperlipidemia: Secondary | ICD-10-CM | POA: Diagnosis not present

## 2022-07-25 DIAGNOSIS — E669 Obesity, unspecified: Secondary | ICD-10-CM

## 2022-07-25 DIAGNOSIS — E1169 Type 2 diabetes mellitus with other specified complication: Secondary | ICD-10-CM

## 2022-07-25 DIAGNOSIS — I25118 Atherosclerotic heart disease of native coronary artery with other forms of angina pectoris: Secondary | ICD-10-CM | POA: Diagnosis not present

## 2022-07-25 NOTE — Addendum Note (Signed)
Addended by: Jacobo Forest D on: 07/25/2022 11:21 AM   Modules accepted: Orders

## 2022-07-25 NOTE — Progress Notes (Signed)
Cardiology Office Note:    Date:  07/25/2022   ID:  LIZ BAHAR, DOB [birth year], MRN [id number]  PCP:  Cher Nakai, MD  Cardiologist:  Jenne Campus, MD    Referring MD: Cher Nakai, MD   Chief Complaint  Patient presents with   Follow-up  Doing better  History of Present Illness:    Angel Reynolds is a 66 y.o. female with past medical history significant for diabetes which was poorly controlled now excellently controlled, dyslipidemia, essential hypertension, smoking she did have a coronary CT angio performed which showed multiple problems after the cardiac catheterization being done which showed 15 to 90% proximal LAD, 90% obtuse marginal branch, completely occluded right coronary artery with collateralization.  After cardiac catheterization she used to suffer from some psychological/neurological event she was confused extensive neurological testing has been performed and eventually conclusion being that this is result of withdrawal from Vicodin.  She did see cardiothoracic surgeon for possible to bypass surgery however scan of her veins in lower extremities revealed very small veins so she was felt not to be a good candidate for bypass surgery but at the same time there was no more discussion about potentially doing hybrid procedure or stenting LAD and obtuse marginal branch. She is a office today for follow-up.  Overall she is doing well she says she is doing better denies have any chest pain tightness squeezing pressure burning chest.  She is said that when she does do some shopping she will be exhausted and tired.  Past Medical History:  Diagnosis Date   Anxiety    Benign essential hypertension    Chronic constipation    Diabetes mellitus    AODM   Elevated liver enzymes    Hypercholesteremia    Hyperlipidemia    Lower back pain    Obesity    Osteoarthritis of left knee 01/06/2021   Peripheral neuropathy    Recurrent major depressive disorder in partial remission ([place])     Type 2 diabetes mellitus with obesity (HCC)     Past Surgical History:  Procedure Laterality Date   ABDOMINAL HYSTERECTOMY     INTRAVASCULAR PRESSURE WIRE/FFR STUDY N/A 03/19/2022   Procedure: INTRAVASCULAR PRESSURE WIRE/FFR STUDY;  Surgeon: Belva Crome, MD;  Location: [place] [facility];  Service: Cardiovascular;  Laterality: N/A;   LEFT HEART CATH AND CORONARY ANGIOGRAPHY N/A 03/19/2022   Procedure: LEFT HEART CATH AND CORONARY ANGIOGRAPHY;  Surgeon: Belva Crome, MD;  Location: [place] [facility];  Service: Cardiovascular;  Laterality: N/A;    Current Medications: Current Meds  Medication Sig   aspirin 81 MG tablet Take 81 mg by mouth daily.   atorvastatin (LIPITOR) 80 MG tablet Take 80 mg by mouth every evening.   Cholecalciferol 25 MCG (1000 UT) capsule Take 1,000 Units by mouth daily.   clotrimazole-betamethasone (LOTRISONE) cream Apply 1 application. topically daily as needed (Eczema).   escitalopram (LEXAPRO) 10 MG tablet Take 10 mg by mouth daily.   famotidine (PEPCID) 20 MG tablet Take 20 mg by mouth daily as needed for heartburn or indigestion.   glimepiride (AMARYL) 4 MG tablet Take 4 mg by mouth 2 (two) times daily.   HYDROcodone-acetaminophen (NORCO/VICODIN) 5-325 MG tablet Take 1 tablet by mouth every 8 (eight) hours as needed for pain.   isosorbide mononitrate (IMDUR) 30 MG 24 hr tablet Take 1 tablet (30 mg total) by mouth daily.   lisinopril (ZESTRIL) 10 MG tablet Take 1 tablet by mouth once daily  metFORMIN (GLUCOPHAGE) 850 MG tablet Take 850 mg by mouth 3 (three) times daily.   metoprolol tartrate (LOPRESSOR) 25 MG tablet Take 1 tablet (25 mg total) by mouth 2 (two) times daily.   nitroGLYCERIN (NITROSTAT) 0.4 MG SL tablet Place 1 tablet (0.4 mg total) under the tongue every 5 (five) minutes as needed for chest pain.   Semaglutide,0.25 or 0.5MG /DOS, (OZEMPIC, 0.25 OR 0.5 MG/DOSE,) 2 MG/3ML SOPN Inject 1 mg into the skin once a week. Injects once weekly    traMADol (ULTRAM) 50 MG tablet Take 50 mg by mouth 3 (three) times daily as needed for pain.     Allergies:   Mobic [meloxicam]   Social History   Socioeconomic History   Marital status: Married    Spouse name: Not on file   Number of children: Not on file   Years of education: Not on file   Highest education level: Not on file  Occupational History   Not on file  Tobacco Use   Smoking status: Former    Types: E-cigarettes, Cigarettes   Smokeless tobacco: Never  Vaping Use   Vaping Use: Every day   Substances: Nicotine  Substance and Sexual Activity   Alcohol use: Not Currently   Drug use: Not Currently   Sexual activity: Not on file  Other Topics Concern   Not on file  Social History Narrative   Not on file   Social Determinants of Health   Financial Resource Strain: Not on file  Food Insecurity: Not on file  Transportation Needs: Not on file  Physical Activity: Not on file  Stress: Not on file  Social Connections: Not on file     Family History: The patient's family history includes Diabetes in her father and maternal uncle. ROS:   Please see the history of present illness.    All 14 point review of systems negative except as described per history of present illness  EKGs/Labs/Other Studies Reviewed:      Recent Labs: 03/19/2022: ALT 25 03/20/2022: BUN 11; Creatinine, Ser 0.88; Hemoglobin 10.5; Platelets 325; Potassium 3.9; Sodium 136  Recent Lipid Panel No results found for: "CHOL", "TRIG", "HDL", "CHOLHDL", "VLDL", "LDLCALC", "LDLDIRECT"  Physical Exam:    VS:  BP (!) 124/54 (BP Location: Left Arm, Patient Position: Sitting)   Pulse 78   Ht 5\' 2"  (1.575 m)   Wt 171 lb 6.4 oz (77.7 kg)   SpO2 95%   BMI 31.35 kg/m     Wt Readings from Last 3 Encounters:  07/25/22 171 lb 6.4 oz (77.7 kg)  05/10/22 175 lb 12.8 oz (79.7 kg)  03/26/22 181 lb (82.1 kg)     GEN:  Well nourished, well developed in no acute distress HEENT: Normal NECK: No JVD; No  carotid bruits LYMPHATICS: No lymphadenopathy CARDIAC: RRR, no murmurs, no rubs, no gallops RESPIRATORY:  Clear to auscultation without rales, wheezing or rhonchi  ABDOMEN: Soft, non-tender, non-distended MUSCULOSKELETAL:  No edema; No deformity  SKIN: Warm and dry LOWER EXTREMITIES: no swelling NEUROLOGIC:  Alert and oriented x 3 PSYCHIATRIC:  Normal affect   ASSESSMENT:    1. Atherosclerosis of native coronary artery of native heart with stable angina pectoris ([place])   2. Benign essential hypertension   3. Type 2 diabetes mellitus with obesity ([place])   4. Mixed hyperlipidemia    PLAN:    In order of problems listed above:  Advanced coronary artery disease.  I will forward question to our interventional colleagues with suggestion that we  may be able to stent her proximal LAD as well as obtuse marginal branch.  I have does meet she do does clinically quite well but those lesions serious and involve triple-vessel disease.  I do think she would be a candidate for bypass surgery but at least we can stent some of her arteries Essential hypertension blood pressure well controlled continue present management. Is well controlled her last hemoglobin A1c was less than 6 Dyslipidemia she is on high intense statin which I will continue her LDL 85 HDL 39 this is from April we will recheck her fasting lipid profile   Medication Adjustments/Labs and Tests Ordered: Current medicines are reviewed at length with the patient today.  Concerns regarding medicines are outlined above.  No orders of the defined types were placed in this encounter.  Medication changes: No orders of the defined types were placed in this encounter.   Signed, Park Liter, MD, [facility] 07/25/2022 10:46 AM    [place]

## 2022-07-25 NOTE — Patient Instructions (Signed)
Medication Instructions:  Your physician recommends that you continue on your current medications as directed. Please refer to the Current Medication list given to you today.  *If you need a refill on your cardiac medications before your next appointment, please call your pharmacy*   Lab Work: Lipid Panel Today If you have labs (blood work) drawn today and your tests are completely normal, you will receive your results only by: [place] (if you have MyChart) OR A paper copy in the mail If you have any lab test that is abnormal or we need to change your treatment, we will call you to review the results.   Testing/Procedures: None Ordered   Follow-Up: At [facility], you and your health needs are our priority.  As part of our continuing mission to provide you with exceptional heart care, we have created designated Provider Care Teams.  These Care Teams include your primary Cardiologist (physician) and Advanced Practice Providers (APPs -  Physician Assistants and Nurse Practitioners) who all work together to provide you with the care you need, when you need it.  We recommend signing up for the patient portal called "MyChart".  Sign up information is provided on this After Visit Summary.  MyChart is used to connect with patients for Virtual Visits (Telemedicine).  Patients are able to view lab/test results, encounter notes, upcoming appointments, etc.  Non-urgent messages can be sent to your provider as well.   To learn more about what you can do with MyChart, go to [email or web address].    Your next appointment:   3 month(s)  The format for your next appointment:   In Person  Provider:   Jenne Campus, MD    Other Instructions NA

## 2022-07-26 LAB — LIPID PANEL
Chol/HDL Ratio: 3.2 ratio (ref 0.0–4.4)
Cholesterol, Total: 133 mg/dL (ref 100–199)
HDL: 41 mg/dL (ref >39)
LDL Chol Calc (NIH): 71 mg/dL (ref 0–99)
Triglycerides: 113 mg/dL (ref 0–149)
VLDL Cholesterol Cal: 21 mg/dL (ref 5–40)

## 2022-08-02 ENCOUNTER — Telehealth: Payer: Self-pay

## 2022-08-02 NOTE — Telephone Encounter (Signed)
----- Message from Park Liter, MD sent at 08/02/2022 10:17 AM EDT ----- Please call her and tell her what is discussion was still about.  It looks like we will be able to stent her left anterior descending artery.  If she prefers to talk with Dr. Cyndie Chime that lets schedule her to have appoint with kidney if not simply schedule her to have a cardiac catheterization with Dr. Irish Lack ----- Message ----- From: Jettie Booze, MD Sent: 07/31/2022   8:25 AM EDT To: Park Liter, MD  Sorry for the delay.  Collier Salina just forwarded me this message.  I can talk to her before or just schedule the PCI.  WOuld plan for atherectomy, so if you can give her a heads up about that, then I can talk to her here before the procedure.  If she prefers an office visit, that is fine with me.   JV ----- Message ----- From: Martinique, Peter M, MD Sent: 07/31/2022   7:31 AM EDT To: Jettie Booze, MD   ----- Message ----- From: Park Liter, MD Sent: 07/26/2022  11:39 AM EDT To: Peter M Martinique, MD  Thank you gentleman for advise.  Would you be willing to talk to the lady first before potentially attempting LAD with stenting or do you prefer me to simply schedule her to have cardiac catheterization? ----- Message ----- From: Martinique, Peter M, MD Sent: 07/25/2022  12:51 PM EDT To: Jettie Booze, MD; #  Agree with Ulice Dash. I would treat the LAD and manage the other disease medically. The RCA occlusion would be challenging. Ambiguous proximal cap, long occlusion and heavily calcified. There is also significant downstream disease in the PDA which is small and not treatable.   Collier Salina ----- Message ----- From: Jettie Booze, MD Sent: 07/25/2022  12:10 PM EDT To: Park Liter, MD; Peter M Martinique, MD  Vanetta Mulders,  I think the LAD is fixable.  I think the RCA is less likely to be a successful CTO due to calcium, but it looks like a big vessel.  THe OM lesion is complex but quite  distal, and would require atherectomy.  I think I would leave that alone and the OM territory is small.  I think the LAD is treatable but would require atherectomy or shockwave due to heavy calcium.  If she had refractory angina after LAD stenting, could attempt CTO PCI of RCA. Adding Collier Salina to discussion.  JV ----- Message ----- From: Park Liter, MD Sent: 07/25/2022  10:50 AM EDT To: Jettie Booze, MD  Hannah Beat  I we appreciate if you take a look at this lady's cardiac catheterization.  She does have significant triple-vessel disease with completely occluded right coronary artery, hemodynamically significant lesion in the proximal LAD as well as significant stenosis of the obtuse marginal branch.  She was evaluated for potential coronary artery bypass graft however because of poor venous conduits in her lower extremities she was disqualified a candidate for bypass surgery but I think and I hope we will be able to help this lady with some angioplasties especially LAD and potentially obtuse marginal branch.  Likely clinically she seems doing well but she does have quite advanced diabetes therefore his symptoms may be not as obvious.  Significant look at her cardiac catheterization and tell me if she would be a candidate for potential stenting of LAD and obtuse marginal branch I will appreciate. Thank you like always for your expertise Herbie [place]

## 2022-08-02 NOTE — Telephone Encounter (Signed)
Spoke with pt per Dr. Wendy Poet note regarding Cardiac Cath or appt with Dr. Irish Lack. Pt wants to think about it and discuss with family and will call back with decision. Pt verbalized understanding and had no further questions.

## 2022-08-15 DIAGNOSIS — E669 Obesity, unspecified: Secondary | ICD-10-CM | POA: Diagnosis not present

## 2022-08-15 DIAGNOSIS — I25110 Atherosclerotic heart disease of native coronary artery with unstable angina pectoris: Secondary | ICD-10-CM | POA: Diagnosis not present

## 2022-08-15 DIAGNOSIS — M25551 Pain in right hip: Secondary | ICD-10-CM | POA: Diagnosis not present

## 2022-08-15 DIAGNOSIS — R748 Abnormal levels of other serum enzymes: Secondary | ICD-10-CM | POA: Diagnosis not present

## 2022-08-15 DIAGNOSIS — E7800 Pure hypercholesterolemia, unspecified: Secondary | ICD-10-CM | POA: Diagnosis not present

## 2022-08-15 DIAGNOSIS — I10 Essential (primary) hypertension: Secondary | ICD-10-CM | POA: Diagnosis not present

## 2022-08-15 DIAGNOSIS — E1169 Type 2 diabetes mellitus with other specified complication: Secondary | ICD-10-CM | POA: Diagnosis not present

## 2022-08-15 DIAGNOSIS — F3341 Major depressive disorder, recurrent, in partial remission: Secondary | ICD-10-CM | POA: Diagnosis not present

## 2022-08-15 DIAGNOSIS — F419 Anxiety disorder, unspecified: Secondary | ICD-10-CM | POA: Diagnosis not present

## 2022-08-29 DIAGNOSIS — M25551 Pain in right hip: Secondary | ICD-10-CM | POA: Diagnosis not present

## 2022-08-29 DIAGNOSIS — I25110 Atherosclerotic heart disease of native coronary artery with unstable angina pectoris: Secondary | ICD-10-CM | POA: Diagnosis not present

## 2022-08-29 DIAGNOSIS — E1169 Type 2 diabetes mellitus with other specified complication: Secondary | ICD-10-CM | POA: Diagnosis not present

## 2022-08-29 DIAGNOSIS — R748 Abnormal levels of other serum enzymes: Secondary | ICD-10-CM | POA: Diagnosis not present

## 2022-08-29 DIAGNOSIS — E7800 Pure hypercholesterolemia, unspecified: Secondary | ICD-10-CM | POA: Diagnosis not present

## 2022-08-29 DIAGNOSIS — I10 Essential (primary) hypertension: Secondary | ICD-10-CM | POA: Diagnosis not present

## 2022-08-29 DIAGNOSIS — Z23 Encounter for immunization: Secondary | ICD-10-CM | POA: Diagnosis not present

## 2022-08-29 DIAGNOSIS — F3341 Major depressive disorder, recurrent, in partial remission: Secondary | ICD-10-CM | POA: Diagnosis not present

## 2022-08-29 DIAGNOSIS — E669 Obesity, unspecified: Secondary | ICD-10-CM | POA: Diagnosis not present

## 2022-08-29 DIAGNOSIS — F419 Anxiety disorder, unspecified: Secondary | ICD-10-CM | POA: Diagnosis not present

## 2022-09-12 DIAGNOSIS — F419 Anxiety disorder, unspecified: Secondary | ICD-10-CM | POA: Diagnosis not present

## 2022-09-12 DIAGNOSIS — E1169 Type 2 diabetes mellitus with other specified complication: Secondary | ICD-10-CM | POA: Diagnosis not present

## 2022-09-12 DIAGNOSIS — E7800 Pure hypercholesterolemia, unspecified: Secondary | ICD-10-CM | POA: Diagnosis not present

## 2022-09-12 DIAGNOSIS — E669 Obesity, unspecified: Secondary | ICD-10-CM | POA: Diagnosis not present

## 2022-09-12 DIAGNOSIS — F3341 Major depressive disorder, recurrent, in partial remission: Secondary | ICD-10-CM | POA: Diagnosis not present

## 2022-09-12 DIAGNOSIS — I25110 Atherosclerotic heart disease of native coronary artery with unstable angina pectoris: Secondary | ICD-10-CM | POA: Diagnosis not present

## 2022-09-12 DIAGNOSIS — I10 Essential (primary) hypertension: Secondary | ICD-10-CM | POA: Diagnosis not present

## 2022-09-12 DIAGNOSIS — M545 Low back pain, unspecified: Secondary | ICD-10-CM | POA: Diagnosis not present

## 2022-09-12 DIAGNOSIS — R748 Abnormal levels of other serum enzymes: Secondary | ICD-10-CM | POA: Diagnosis not present

## 2022-09-18 DIAGNOSIS — Z7984 Long term (current) use of oral hypoglycemic drugs: Secondary | ICD-10-CM | POA: Diagnosis not present

## 2022-09-18 DIAGNOSIS — E119 Type 2 diabetes mellitus without complications: Secondary | ICD-10-CM | POA: Diagnosis not present

## 2022-10-05 DIAGNOSIS — H40003 Preglaucoma, unspecified, bilateral: Secondary | ICD-10-CM | POA: Diagnosis not present

## 2022-10-05 DIAGNOSIS — H25812 Combined forms of age-related cataract, left eye: Secondary | ICD-10-CM | POA: Diagnosis not present

## 2022-10-05 DIAGNOSIS — H25813 Combined forms of age-related cataract, bilateral: Secondary | ICD-10-CM | POA: Diagnosis not present

## 2022-10-05 DIAGNOSIS — H25811 Combined forms of age-related cataract, right eye: Secondary | ICD-10-CM | POA: Diagnosis not present

## 2022-10-11 DIAGNOSIS — I10 Essential (primary) hypertension: Secondary | ICD-10-CM | POA: Diagnosis not present

## 2022-10-11 DIAGNOSIS — I25110 Atherosclerotic heart disease of native coronary artery with unstable angina pectoris: Secondary | ICD-10-CM | POA: Diagnosis not present

## 2022-10-11 DIAGNOSIS — R748 Abnormal levels of other serum enzymes: Secondary | ICD-10-CM | POA: Diagnosis not present

## 2022-10-11 DIAGNOSIS — E669 Obesity, unspecified: Secondary | ICD-10-CM | POA: Diagnosis not present

## 2022-10-11 DIAGNOSIS — F3341 Major depressive disorder, recurrent, in partial remission: Secondary | ICD-10-CM | POA: Diagnosis not present

## 2022-10-11 DIAGNOSIS — E7800 Pure hypercholesterolemia, unspecified: Secondary | ICD-10-CM | POA: Diagnosis not present

## 2022-10-11 DIAGNOSIS — M25562 Pain in left knee: Secondary | ICD-10-CM | POA: Diagnosis not present

## 2022-10-11 DIAGNOSIS — F419 Anxiety disorder, unspecified: Secondary | ICD-10-CM | POA: Diagnosis not present

## 2022-10-11 DIAGNOSIS — E1169 Type 2 diabetes mellitus with other specified complication: Secondary | ICD-10-CM | POA: Diagnosis not present

## 2022-10-19 ENCOUNTER — Ambulatory Visit: Payer: Medicare HMO | Attending: Cardiology | Admitting: Cardiology

## 2022-10-19 ENCOUNTER — Encounter: Payer: Self-pay | Admitting: Cardiology

## 2022-10-19 VITALS — BP 130/60 | HR 74 | Ht 62.0 in | Wt 175.0 lb

## 2022-10-19 DIAGNOSIS — R748 Abnormal levels of other serum enzymes: Secondary | ICD-10-CM | POA: Diagnosis not present

## 2022-10-19 DIAGNOSIS — E782 Mixed hyperlipidemia: Secondary | ICD-10-CM | POA: Diagnosis not present

## 2022-10-19 DIAGNOSIS — I10 Essential (primary) hypertension: Secondary | ICD-10-CM

## 2022-10-19 DIAGNOSIS — I209 Angina pectoris, unspecified: Secondary | ICD-10-CM

## 2022-10-19 DIAGNOSIS — E1169 Type 2 diabetes mellitus with other specified complication: Secondary | ICD-10-CM | POA: Diagnosis not present

## 2022-10-19 DIAGNOSIS — I25118 Atherosclerotic heart disease of native coronary artery with other forms of angina pectoris: Secondary | ICD-10-CM | POA: Diagnosis not present

## 2022-10-19 DIAGNOSIS — E7800 Pure hypercholesterolemia, unspecified: Secondary | ICD-10-CM | POA: Diagnosis not present

## 2022-10-19 DIAGNOSIS — E669 Obesity, unspecified: Secondary | ICD-10-CM | POA: Diagnosis not present

## 2022-10-19 DIAGNOSIS — I25110 Atherosclerotic heart disease of native coronary artery with unstable angina pectoris: Secondary | ICD-10-CM | POA: Diagnosis not present

## 2022-10-19 DIAGNOSIS — M25562 Pain in left knee: Secondary | ICD-10-CM | POA: Diagnosis not present

## 2022-10-19 DIAGNOSIS — F3341 Major depressive disorder, recurrent, in partial remission: Secondary | ICD-10-CM | POA: Diagnosis not present

## 2022-10-19 DIAGNOSIS — F419 Anxiety disorder, unspecified: Secondary | ICD-10-CM | POA: Diagnosis not present

## 2022-10-19 MED ORDER — ISOSORBIDE MONONITRATE ER 60 MG PO TB24: 60.0000 mg | ORAL_TABLET | Freq: Every day | ORAL | 3 refills | Status: DC

## 2022-10-19 NOTE — Progress Notes (Unsigned)
Cardiology Office Note:    Date:  10/19/2022   ID:  Angel Reynolds, DOB 1956-05-20, MRN [id number]  PCP:  Simone Curia, MD  Cardiologist:  Gypsy Balsam, MD    Referring MD: Simone Curia, MD   Chief Complaint  Patient presents with   Follow-up    History of Present Illness:    Angel Reynolds is a 66 y.o. female  with past medical history significant for diabetes which was poorly controlled now excellently controlled, dyslipidemia, essential hypertension, smoking she did have a coronary CT angio performed which showed multiple problems after the cardiac catheterization being done which showed 80 to 90% proximal LAD, 90% obtuse marginal branch, completely occluded right coronary artery with collateralization. After cardiac catheterization she used to suffer from some psychological/neurological event she was confused extensive neurological testing has been performed and eventually conclusion being that this is result of withdrawal from Vicodin. She did see cardiothoracic surgeon for possible to bypass surgery however scan of her veins in lower extremities revealed very small veins so she was felt not to be a good candidate for bypass surgery but at the same time there was no more discussion about potentially doing hybrid procedure or stenting LAD and obtuse marginal branch.   She comes today to months for follow-up.  She tells me that she got few episodes of angina that required nitroglycerin.  Otherwise he seems to be holding quite well.  Again had a long discussion about what to do with the situation I will refer her to our interventional colleagues so they can explore possibility of doing intervention on the LAD and obtuse marginal branch Past Medical History:  Diagnosis Date   Anxiety    Benign essential hypertension    Chronic constipation    Diabetes mellitus    AODM   Elevated liver enzymes    Hypercholesteremia    Hyperlipidemia    Lower back pain    Obesity    Osteoarthritis  of left knee 01/06/2021   Peripheral neuropathy    Recurrent major depressive disorder in partial remission (HCC)    Type 2 diabetes mellitus with obesity (HCC)     Past Surgical History:  Procedure Laterality Date   ABDOMINAL HYSTERECTOMY     INTRAVASCULAR PRESSURE WIRE/FFR STUDY N/A 03/19/2022   Procedure: INTRAVASCULAR PRESSURE WIRE/FFR STUDY;  Surgeon: Lyn Records, MD;  Location: [facility];  Service: Cardiovascular;  Laterality: N/A;   LEFT HEART CATH AND CORONARY ANGIOGRAPHY N/A 03/19/2022   Procedure: LEFT HEART CATH AND CORONARY ANGIOGRAPHY;  Surgeon: Lyn Records, MD;  Location: [facility];  Service: Cardiovascular;  Laterality: N/A;    Current Medications: Current Meds  Medication Sig   aspirin 81 MG tablet Take 81 mg by mouth daily.   atorvastatin (LIPITOR) 80 MG tablet Take 80 mg by mouth every evening.   Cholecalciferol 25 MCG (1000 UT) capsule Take 1,000 Units by mouth daily.   clotrimazole-betamethasone (LOTRISONE) cream Apply 1 application. topically daily as needed (Eczema).   escitalopram (LEXAPRO) 10 MG tablet Take 10 mg by mouth daily.   famotidine (PEPCID) 20 MG tablet Take 20 mg by mouth daily as needed for heartburn or indigestion.   glimepiride (AMARYL) 4 MG tablet Take 4 mg by mouth 2 (two) times daily.   HYDROcodone-acetaminophen (NORCO/VICODIN) 5-325 MG tablet Take 1 tablet by mouth every 8 (eight) hours as needed for pain.   isosorbide mononitrate (IMDUR) 30 MG 24 hr tablet Take 1 tablet (30 mg  total) by mouth daily.   lisinopril (ZESTRIL) 10 MG tablet Take 1 tablet by mouth once daily   metFORMIN (GLUCOPHAGE) 850 MG tablet Take 850 mg by mouth 3 (three) times daily.   metoprolol tartrate (LOPRESSOR) 25 MG tablet Take 1 tablet (25 mg total) by mouth 2 (two) times daily.   nitroGLYCERIN (NITROSTAT) 0.4 MG SL tablet Place 1 tablet (0.4 mg total) under the tongue every 5 (five) minutes as needed for chest pain.   OZEMPIC, 1 MG/DOSE, 4 MG/3ML  SOPN Inject 1 mg into the skin once a week.   traMADol (ULTRAM) 50 MG tablet Take 50 mg by mouth 3 (three) times daily as needed for pain.     Allergies:   Mobic [meloxicam]   Social History   Socioeconomic History   Marital status: Married    Spouse name: Not on file   Number of children: Not on file   Years of education: Not on file   Highest education level: Not on file  Occupational History   Not on file  Tobacco Use   Smoking status: Former    Types: E-cigarettes, Cigarettes   Smokeless tobacco: Never  Vaping Use   Vaping Use: Every day   Substances: Nicotine  Substance and Sexual Activity   Alcohol use: Not Currently   Drug use: Not Currently   Sexual activity: Not on file  Other Topics Concern   Not on file  Social History Narrative   Not on file   Social Determinants of Health   Financial Resource Strain: Not on file  Food Insecurity: Not on file  Transportation Needs: Not on file  Physical Activity: Not on file  Stress: Not on file  Social Connections: Not on file     Family History: The patient's family history includes Diabetes in her father and maternal uncle. ROS:   Please see the history of present illness.    All 14 point review of systems negative except as described per history of present illness  EKGs/Labs/Other Studies Reviewed:      Recent Labs: 03/19/2022: ALT 25 03/20/2022: BUN 11; Creatinine, Ser 0.88; Hemoglobin 10.5; Platelets 325; Potassium 3.9; Sodium 136  Recent Lipid Panel    Component Value Date/Time   CHOL 133 07/25/2022 1121   TRIG 113 07/25/2022 1121   HDL 41 07/25/2022 1121   CHOLHDL 3.2 07/25/2022 1121   LDLCALC 71 07/25/2022 1121    Physical Exam:    VS:  BP 130/60 (BP Location: Left Arm, Patient Position: Sitting, Cuff Size: Normal)   Pulse 74   Ht 5\' 2"  (1.575 m)   Wt 175 lb (79.4 kg)   SpO2 97%   BMI 32.01 kg/m     Wt Readings from Last 3 Encounters:  10/19/22 175 lb (79.4 kg)  07/25/22 171 lb 6.4 oz  (77.7 kg)  05/10/22 175 lb 12.8 oz (79.7 kg)     GEN:  Well nourished, well developed in no acute distress HEENT: Normal NECK: No JVD; No carotid bruits LYMPHATICS: No lymphadenopathy CARDIAC: RRR, no murmurs, no rubs, no gallops RESPIRATORY:  Clear to auscultation without rales, wheezing or rhonchi  ABDOMEN: Soft, non-tender, non-distended MUSCULOSKELETAL:  No edema; No deformity  SKIN: Warm and dry LOWER EXTREMITIES: no swelling NEUROLOGIC:  Alert and oriented x 3 PSYCHIATRIC:  Normal affect   ASSESSMENT:    1. Atherosclerosis of native coronary artery of native heart with stable angina pectoris ([place])   2. Angina pectoris ([place])   3. Benign essential hypertension  4. Mixed hyperlipidemia    PLAN:    In order of problems listed above:  Advanced coronary disease, she was disqualifies candidate for bypass surgery because of poor conduits, she will be referred to our interventional colleagues for evaluation for possible angioplasty of LAD as well as obtuse marginal branch.  In the meantime I will increase dose of Imdur from 30-60. Dyslipidemia she is on high intense statin which I continue Lipitor 80.  Last cholesterol was slightly elevated but she ran out of medication for a while likely she is back on it. Diabetes mellitus stable controlled with good hemoglobin A1c Benign essential hypertension blood pressure controlled   Medication Adjustments/Labs and Tests Ordered: Current medicines are reviewed at length with the patient today.  Concerns regarding medicines are outlined above.  No orders of the defined types were placed in this encounter.  Medication changes: No orders of the defined types were placed in this encounter.   Signed, Park Liter, MD, [facility] 10/19/2022 11:11 AM    [place]

## 2022-10-19 NOTE — Patient Instructions (Addendum)
Medication Instructions:   INCREASE: Imdur to 60mg  1 daily   Lab Work: None Ordered If you have labs (blood work) drawn today and your tests are completely normal, you will receive your results only by: MyChart Message (if you have MyChart) OR A paper copy in the mail If you have any lab test that is abnormal or we need to change your treatment, we will call you to review the results.   Testing/Procedures: None Ordered   Follow-Up: At [facility], you and your health needs are our priority.  As part of our continuing mission to provide you with exceptional heart care, we have created designated Provider Care Teams.  These Care Teams include your primary Cardiologist (physician) and Advanced Practice Providers (APPs -  Physician Assistants and Nurse Practitioners) who all work together to provide you with the care you need, when you need it.  We recommend signing up for the patient portal called "MyChart".  Sign up information is provided on this After Visit Summary.  MyChart is used to connect with patients for Virtual Visits (Telemedicine).  Patients are able to view lab/test results, encounter notes, upcoming appointments, etc.  Non-urgent messages can be sent to your provider as well.   To learn more about what you can do with MyChart, go to [facility].    Your next appointment:   3 month(s)  The format for your next appointment:   In Person  Provider:   [email or web address], MD    Other Instructions Referral made to Dr. Gypsy Balsam - they will call for appt

## 2022-11-07 DIAGNOSIS — F3341 Major depressive disorder, recurrent, in partial remission: Secondary | ICD-10-CM | POA: Diagnosis not present

## 2022-11-07 DIAGNOSIS — E7800 Pure hypercholesterolemia, unspecified: Secondary | ICD-10-CM | POA: Diagnosis not present

## 2022-11-07 DIAGNOSIS — M545 Low back pain, unspecified: Secondary | ICD-10-CM | POA: Diagnosis not present

## 2022-11-07 DIAGNOSIS — E669 Obesity, unspecified: Secondary | ICD-10-CM | POA: Diagnosis not present

## 2022-11-07 DIAGNOSIS — I10 Essential (primary) hypertension: Secondary | ICD-10-CM | POA: Diagnosis not present

## 2022-11-07 DIAGNOSIS — R748 Abnormal levels of other serum enzymes: Secondary | ICD-10-CM | POA: Diagnosis not present

## 2022-11-07 DIAGNOSIS — F419 Anxiety disorder, unspecified: Secondary | ICD-10-CM | POA: Diagnosis not present

## 2022-11-07 DIAGNOSIS — I25110 Atherosclerotic heart disease of native coronary artery with unstable angina pectoris: Secondary | ICD-10-CM | POA: Diagnosis not present

## 2022-11-07 DIAGNOSIS — E1169 Type 2 diabetes mellitus with other specified complication: Secondary | ICD-10-CM | POA: Diagnosis not present

## 2022-12-04 DIAGNOSIS — I10 Essential (primary) hypertension: Secondary | ICD-10-CM | POA: Diagnosis not present

## 2022-12-04 DIAGNOSIS — E1169 Type 2 diabetes mellitus with other specified complication: Secondary | ICD-10-CM | POA: Diagnosis not present

## 2022-12-04 DIAGNOSIS — E7800 Pure hypercholesterolemia, unspecified: Secondary | ICD-10-CM | POA: Diagnosis not present

## 2022-12-04 DIAGNOSIS — F419 Anxiety disorder, unspecified: Secondary | ICD-10-CM | POA: Diagnosis not present

## 2022-12-04 DIAGNOSIS — E669 Obesity, unspecified: Secondary | ICD-10-CM | POA: Diagnosis not present

## 2022-12-04 DIAGNOSIS — I25110 Atherosclerotic heart disease of native coronary artery with unstable angina pectoris: Secondary | ICD-10-CM | POA: Diagnosis not present

## 2022-12-04 DIAGNOSIS — M545 Low back pain, unspecified: Secondary | ICD-10-CM | POA: Diagnosis not present

## 2022-12-04 DIAGNOSIS — R748 Abnormal levels of other serum enzymes: Secondary | ICD-10-CM | POA: Diagnosis not present

## 2022-12-04 DIAGNOSIS — F3341 Major depressive disorder, recurrent, in partial remission: Secondary | ICD-10-CM | POA: Diagnosis not present

## 2022-12-04 NOTE — H&P (View-Only) (Signed)
Cardiology Office Note   Date:  12/05/2022   ID:  Angel, Reynolds August 30, 1956, MRN [id number]  PCP:  Cher Nakai, MD    No chief complaint on file.  CAD  Wt Readings from Last 3 Encounters:  12/05/22 172 lb 12.8 oz (78.4 kg)  10/19/22 175 lb (79.4 kg)  07/25/22 171 lb 6.4 oz (77.7 kg)       History of Present Illness: Angel Reynolds is a 67 y.o. female who is being seen today for the evaluation of CAD at the request of Cher Nakai, MD.   Records from Dr. Agustin Cree reveal: " with past medical history significant for diabetes which was poorly controlled now excellently controlled, dyslipidemia, essential hypertension, smoking she did have a coronary CT angio performed which showed multiple problems after the cardiac catheterization being done which showed 52 to 90% proximal LAD, 90% obtuse marginal branch, completely occluded right coronary artery with collateralization. After cardiac catheterization she used to suffer from some psychological/neurological event she was confused extensive neurological testing has been performed and eventually conclusion being that this is result of withdrawal from Vicodin. She did see cardiothoracic surgeon for possible to bypass surgery however scan of her veins in lower extremities revealed very small veins so she was felt not to be a good candidate for bypass surgery but at the same time there was no more discussion about potentially doing hybrid procedure or stenting LAD and obtuse marginal branch."  "She tells me that she got few episodes of angina that required nitroglycerin. Otherwise he seems to be holding quite well. Again had a long discussion about what to do with the situation I will refer her to our interventional colleagues so they can explore possibility of doing intervention on the LAD and obtuse marginal branch."  Exercise limited by back and knee pain.  Not so much by chest pain.   Denies :  Dizziness. Leg edema. Nitroglycerin use.  Orthopnea. Palpitations. Paroxysmal nocturnal dyspnea. Shortness of breath. Syncope.   Last week, she had some chest pain.  Relieved with 1 SL NTG. Sx improved with higher dose of isosorbide.     Past Medical History:  Diagnosis Date   Anxiety    Benign essential hypertension    Chronic constipation    Diabetes mellitus    AODM   Elevated liver enzymes    Hypercholesteremia    Hyperlipidemia    Lower back pain    Obesity    Osteoarthritis of left knee 01/06/2021   Peripheral neuropathy    Recurrent major depressive disorder in partial remission ([place])    Type 2 diabetes mellitus with obesity (HCC)     Past Surgical History:  Procedure Laterality Date   ABDOMINAL HYSTERECTOMY     INTRAVASCULAR PRESSURE WIRE/FFR STUDY N/A 03/19/2022   Procedure: INTRAVASCULAR PRESSURE WIRE/FFR STUDY;  Surgeon: Belva Crome, MD;  Location: [place] [facility];  Service: Cardiovascular;  Laterality: N/A;   LEFT HEART CATH AND CORONARY ANGIOGRAPHY N/A 03/19/2022   Procedure: LEFT HEART CATH AND CORONARY ANGIOGRAPHY;  Surgeon: Belva Crome, MD;  Location: [place] [facility];  Service: Cardiovascular;  Laterality: N/A;     Current Outpatient Medications  Medication Sig Dispense Refill   aspirin 81 MG tablet Take 81 mg by mouth daily.     atorvastatin (LIPITOR) 80 MG tablet Take 80 mg by mouth every evening.     Cholecalciferol 25 MCG (1000 UT) capsule Take 1,000 Units by mouth daily.  clotrimazole-betamethasone (LOTRISONE) cream Apply 1 application. topically daily as needed (Eczema).     escitalopram (LEXAPRO) 10 MG tablet Take 10 mg by mouth daily.     famotidine (PEPCID) 20 MG tablet Take 20 mg by mouth daily as needed for heartburn or indigestion.     glimepiride (AMARYL) 4 MG tablet Take 4 mg by mouth 2 (two) times daily.     HYDROcodone-acetaminophen (NORCO/VICODIN) 5-325 MG tablet Take 1 tablet by mouth every 8 (eight) hours as needed for pain.     isosorbide mononitrate (IMDUR) 60 MG  24 hr tablet Take 1 tablet (60 mg total) by mouth daily. 90 tablet 3   lisinopril (ZESTRIL) 10 MG tablet Take 1 tablet by mouth once daily 90 tablet 2   metFORMIN (GLUCOPHAGE) 850 MG tablet Take 850 mg by mouth 3 (three) times daily.     metoprolol tartrate (LOPRESSOR) 25 MG tablet Take 1 tablet (25 mg total) by mouth 2 (two) times daily. 180 tablet 3   nitroGLYCERIN (NITROSTAT) 0.4 MG SL tablet Place 1 tablet (0.4 mg total) under the tongue every 5 (five) minutes as needed for chest pain. 35 tablet 2   OZEMPIC, 1 MG/DOSE, 4 MG/3ML SOPN Inject 1 mg into the skin once a week.     traMADol (ULTRAM) 50 MG tablet Take 50 mg by mouth 3 (three) times daily as needed for pain.     No current facility-administered medications for this visit.    Allergies:   Mobic [meloxicam]    Social History:  The patient  reports that she has quit smoking. Her smoking use included e-cigarettes and cigarettes. She has never used smokeless tobacco. She reports that she does not currently use alcohol. She reports that she does not currently use drugs.   Family History:  The patient's family history includes Diabetes in her father and maternal uncle.    ROS:  Please see the history of present illness.   Otherwise, review of systems are positive for knee pain.   All other systems are reviewed and negative.    PHYSICAL EXAM: VS:  BP 108/60   Pulse 70   Ht 5' 2" (1.575 m)   Wt 172 lb 12.8 oz (78.4 kg)   SpO2 96%   BMI 31.61 kg/m  , BMI Body mass index is 31.61 kg/m. GEN: Well nourished, well developed, in no acute distress HEENT: normal Neck: no JVD, carotid bruits, or masses Cardiac: RRR; no murmurs, rubs, or gallops,no edema; 2+ right radial pulse  Respiratory:  clear to auscultation bilaterally, normal work of breathing GI: soft, nontender, nondistended, + BS MS: no deformity or atrophy Skin: warm and dry, no rash Neuro:  Strength and sensation are intact Psych: euthymic mood, full affect   EKG:    The ekg ordered today demonstrates NSR, LVH   Recent Labs: 03/19/2022: ALT 25 03/20/2022: BUN 11; Creatinine, Ser 0.88; Hemoglobin 10.5; Platelets 325; Potassium 3.9; Sodium 136   Lipid Panel    Component Value Date/Time   CHOL 133 07/25/2022 1121   TRIG 113 07/25/2022 1121   HDL 41 07/25/2022 1121   CHOLHDL 3.2 07/25/2022 1121   LDLCALC 71 07/25/2022 1121     Other studies Reviewed: Additional studies/ records that were reviewed today with results demonstrating: LDL 71, A1C 8.2   ASSESSMENT AND PLAN:  CAD: Complex, calcified LAD lesion.  Would need atherectomy for PCI as she was not thought to be a surgical candidate.  The risks and benefits of orbital atherectomy were   explained to the patient.  She is in agreement to proceed.  She was concerned because she had some acute confusion post procedure the last time.  She has had multiple other procedures such as colonoscopy in which she had no problems.  Unclear what the cause of her acute confusion was at the time of the catheterization.  There was no evidence of stroke.  Still having some angina but long term concern would be heart failure wihtout revascularization.  Patient and family are in agreement to proceed.  Hyperlipidemia: LDL 71. Continue lipid lowering therapy and healthy diet.  DM: Whole food, plant based diet. Exercise is limited by joint pains.  Finding some activity that allows more activity will be helpful.  HTN: low salt diet.  Avoid processed foods.   The patient understands that risks include but are not limited to stroke (1 in 1000), death (1 in 1000), kidney failure [usually temporary] (1 in 500), bleeding (1 in 200), allergic reaction [possibly serious] (1 in 200), and agrees to proceed.    All questions about cath answered.  Current medicines are reviewed at length with the patient today.  The patient concerns regarding her medicines were addressed.  The following changes have been made:  add plavix 75 mg  daily  Labs/ tests ordered today include:  No orders of the defined types were placed in this encounter.   Recommend 150 minutes/week of aerobic exercise Low fat, low carb, high fiber diet recommended  Disposition:   FU in for cath next week   Signed, Anuj Summons, MD  12/05/2022 1:52 PM    [facility] [address], , [place]  [address] Phone: [phone]; Fax: [phone]   

## 2022-12-04 NOTE — Progress Notes (Unsigned)
Cardiology Office Note   Date:  12/05/2022   ID:  [name], Angel Reynolds August 30, 1956, MRN [id number]  PCP:  Cher Nakai, MD    No chief complaint on file.  CAD  Wt Readings from Last 3 Encounters:  12/05/22 172 lb 12.8 oz (78.4 kg)  10/19/22 175 lb (79.4 kg)  07/25/22 171 lb 6.4 oz (77.7 kg)       History of Present Illness: Angel Reynolds is a 67 y.o. female who is being seen today for the evaluation of CAD at the request of Cher Nakai, MD.   Records from Dr. Agustin Cree reveal: " with past medical history significant for diabetes which was poorly controlled now excellently controlled, dyslipidemia, essential hypertension, smoking she did have a coronary CT angio performed which showed multiple problems after the cardiac catheterization being done which showed 52 to 90% proximal LAD, 90% obtuse marginal branch, completely occluded right coronary artery with collateralization. After cardiac catheterization she used to suffer from some psychological/neurological event she was confused extensive neurological testing has been performed and eventually conclusion being that this is result of withdrawal from Vicodin. She did see cardiothoracic surgeon for possible to bypass surgery however scan of her veins in lower extremities revealed very small veins so she was felt not to be a good candidate for bypass surgery but at the same time there was no more discussion about potentially doing hybrid procedure or stenting LAD and obtuse marginal branch."  "She tells me that she got few episodes of angina that required nitroglycerin. Otherwise he seems to be holding quite well. Again had a long discussion about what to do with the situation I will refer her to our interventional colleagues so they can explore possibility of doing intervention on the LAD and obtuse marginal branch."  Exercise limited by back and knee pain.  Not so much by chest pain.   Denies :  Dizziness. Leg edema. Nitroglycerin use.  Orthopnea. Palpitations. Paroxysmal nocturnal dyspnea. Shortness of breath. Syncope.   Last week, she had some chest pain.  Relieved with 1 SL NTG. Sx improved with higher dose of isosorbide.     Past Medical History:  Diagnosis Date   Anxiety    Benign essential hypertension    Chronic constipation    Diabetes mellitus    AODM   Elevated liver enzymes    Hypercholesteremia    Hyperlipidemia    Lower back pain    Obesity    Osteoarthritis of left knee 01/06/2021   Peripheral neuropathy    Recurrent major depressive disorder in partial remission ([place])    Type 2 diabetes mellitus with obesity (HCC)     Past Surgical History:  Procedure Laterality Date   ABDOMINAL HYSTERECTOMY     INTRAVASCULAR PRESSURE WIRE/FFR STUDY N/A 03/19/2022   Procedure: INTRAVASCULAR PRESSURE WIRE/FFR STUDY;  Surgeon: Belva Crome, MD;  Location: [facility];  Service: Cardiovascular;  Laterality: N/A;   LEFT HEART CATH AND CORONARY ANGIOGRAPHY N/A 03/19/2022   Procedure: LEFT HEART CATH AND CORONARY ANGIOGRAPHY;  Surgeon: Belva Crome, MD;  Location: [facility];  Service: Cardiovascular;  Laterality: N/A;     Current Outpatient Medications  Medication Sig Dispense Refill   aspirin 81 MG tablet Take 81 mg by mouth daily.     atorvastatin (LIPITOR) 80 MG tablet Take 80 mg by mouth every evening.     Cholecalciferol 25 MCG (1000 UT) capsule Take 1,000 Units by mouth daily.  clotrimazole-betamethasone (LOTRISONE) cream Apply 1 application. topically daily as needed (Eczema).     escitalopram (LEXAPRO) 10 MG tablet Take 10 mg by mouth daily.     famotidine (PEPCID) 20 MG tablet Take 20 mg by mouth daily as needed for heartburn or indigestion.     glimepiride (AMARYL) 4 MG tablet Take 4 mg by mouth 2 (two) times daily.     HYDROcodone-acetaminophen (NORCO/VICODIN) 5-325 MG tablet Take 1 tablet by mouth every 8 (eight) hours as needed for pain.     isosorbide mononitrate (IMDUR) 60 MG  24 hr tablet Take 1 tablet (60 mg total) by mouth daily. 90 tablet 3   lisinopril (ZESTRIL) 10 MG tablet Take 1 tablet by mouth once daily 90 tablet 2   metFORMIN (GLUCOPHAGE) 850 MG tablet Take 850 mg by mouth 3 (three) times daily.     metoprolol tartrate (LOPRESSOR) 25 MG tablet Take 1 tablet (25 mg total) by mouth 2 (two) times daily. 180 tablet 3   nitroGLYCERIN (NITROSTAT) 0.4 MG SL tablet Place 1 tablet (0.4 mg total) under the tongue every 5 (five) minutes as needed for chest pain. 35 tablet 2   OZEMPIC, 1 MG/DOSE, 4 MG/3ML SOPN Inject 1 mg into the skin once a week.     traMADol (ULTRAM) 50 MG tablet Take 50 mg by mouth 3 (three) times daily as needed for pain.     No current facility-administered medications for this visit.    Allergies:   Mobic [meloxicam]    Social History:  The patient  reports that she has quit smoking. Her smoking use included e-cigarettes and cigarettes. She has never used smokeless tobacco. She reports that she does not currently use alcohol. She reports that she does not currently use drugs.   Family History:  The patient's family history includes Diabetes in her father and maternal uncle.    ROS:  Please see the history of present illness.   Otherwise, review of systems are positive for knee pain.   All other systems are reviewed and negative.    PHYSICAL EXAM: VS:  BP 108/60   Pulse 70   Ht 5\' 2"  (1.575 m)   Wt 172 lb 12.8 oz (78.4 kg)   SpO2 96%   BMI 31.61 kg/m  , BMI Body mass index is 31.61 kg/m. GEN: Well nourished, well developed, in no acute distress HEENT: normal Neck: no JVD, carotid bruits, or masses Cardiac: RRR; no murmurs, rubs, or gallops,no edema; 2+ right radial pulse  Respiratory:  clear to auscultation bilaterally, normal work of breathing GI: soft, nontender, nondistended, + BS MS: no deformity or atrophy Skin: warm and dry, no rash Neuro:  Strength and sensation are intact Psych: euthymic mood, full affect   EKG:    The ekg ordered today demonstrates NSR, LVH   Recent Labs: 03/19/2022: ALT 25 03/20/2022: BUN 11; Creatinine, Ser 0.88; Hemoglobin 10.5; Platelets 325; Potassium 3.9; Sodium 136   Lipid Panel    Component Value Date/Time   CHOL 133 07/25/2022 1121   TRIG 113 07/25/2022 1121   HDL 41 07/25/2022 1121   CHOLHDL 3.2 07/25/2022 1121   LDLCALC 71 07/25/2022 1121     Other studies Reviewed: Additional studies/ records that were reviewed today with results demonstrating: LDL 71, A1C 8.2   ASSESSMENT AND PLAN:  CAD: Complex, calcified LAD lesion.  Would need atherectomy for PCI as she was not thought to be a surgical candidate.  The risks and benefits of orbital atherectomy were  explained to the patient.  She is in agreement to proceed.  She was concerned because she had some acute confusion post procedure the last time.  She has had multiple other procedures such as colonoscopy in which she had no problems.  Unclear what the cause of her acute confusion was at the time of the catheterization.  There was no evidence of stroke.  Still having some angina but long term concern would be heart failure wihtout revascularization.  Patient and family are in agreement to proceed.  Hyperlipidemia: LDL 71. Continue lipid lowering therapy and healthy diet.  DM: Whole food, plant based diet. Exercise is limited by joint pains.  Finding some activity that allows more activity will be helpful.  HTN: low salt diet.  Avoid processed foods.   The patient understands that risks include but are not limited to stroke (1 in 1000), death (1 in 54), kidney failure [usually temporary] (1 in 500), bleeding (1 in 200), allergic reaction [possibly serious] (1 in 200), and agrees to proceed.    All questions about cath answered.  Current medicines are reviewed at length with the patient today.  The patient concerns regarding her medicines were addressed.  The following changes have been made:  add plavix 75 mg  daily  Labs/ tests ordered today include:  No orders of the defined types were placed in this encounter.   Recommend 150 minutes/week of aerobic exercise Low fat, low carb, high fiber diet recommended  Disposition:   FU in for cath next week   Signed, Larae Grooms, MD  12/05/2022 [facility] [address], [place], [place]  [address] Phone: [phone]; Fax: [phone]

## 2022-12-05 ENCOUNTER — Ambulatory Visit: Payer: Medicare HMO | Attending: Interventional Cardiology | Admitting: Interventional Cardiology

## 2022-12-05 ENCOUNTER — Encounter: Payer: Self-pay | Admitting: Interventional Cardiology

## 2022-12-05 VITALS — BP 108/60 | HR 70 | Ht 62.0 in | Wt 172.8 lb

## 2022-12-05 DIAGNOSIS — E782 Mixed hyperlipidemia: Secondary | ICD-10-CM | POA: Diagnosis not present

## 2022-12-05 DIAGNOSIS — E669 Obesity, unspecified: Secondary | ICD-10-CM

## 2022-12-05 DIAGNOSIS — I25118 Atherosclerotic heart disease of native coronary artery with other forms of angina pectoris: Secondary | ICD-10-CM | POA: Diagnosis not present

## 2022-12-05 DIAGNOSIS — Z01812 Encounter for preprocedural laboratory examination: Secondary | ICD-10-CM | POA: Diagnosis not present

## 2022-12-05 DIAGNOSIS — E1169 Type 2 diabetes mellitus with other specified complication: Secondary | ICD-10-CM | POA: Diagnosis not present

## 2022-12-05 DIAGNOSIS — I10 Essential (primary) hypertension: Secondary | ICD-10-CM

## 2022-12-05 LAB — BASIC METABOLIC PANEL
BUN/Creatinine Ratio: 21 (ref 12–28)
BUN: 19 mg/dL (ref 8–27)
CO2: 25 mmol/L (ref 20–29)
Calcium: 9 mg/dL (ref 8.7–10.3)
Chloride: 102 mmol/L (ref 96–106)
Creatinine, Ser: 0.92 mg/dL (ref 0.57–1.00)
Glucose: 194 mg/dL — ABNORMAL HIGH (ref 70–99)
Potassium: 4.5 mmol/L (ref 3.5–5.2)
Sodium: 137 mmol/L (ref 134–144)
eGFR: 68 mL/min/{1.73_m2} (ref >59)

## 2022-12-05 LAB — CBC
Hematocrit: 29.5 % — ABNORMAL LOW (ref 34.0–46.6)
Hemoglobin: 9.6 g/dL — ABNORMAL LOW (ref 11.1–15.9)
MCH: 27.8 pg (ref 26.6–33.0)
MCHC: 32.5 g/dL (ref 31.5–35.7)
MCV: 86 fL (ref 79–97)
Platelets: 367 10*3/uL (ref 150–450)
RBC: 3.45 x10E6/uL — ABNORMAL LOW (ref 3.77–5.28)
RDW: 14.8 % (ref 11.7–15.4)
WBC: 7.4 10*3/uL (ref 3.4–10.8)

## 2022-12-05 MED ORDER — CLOPIDOGREL BISULFATE 75 MG PO TABS: 75.0000 mg | ORAL_TABLET | Freq: Every day | ORAL | 6 refills | Status: DC

## 2022-12-05 NOTE — Patient Instructions (Signed)
Medication Instructions:  Your physician has recommended you make the following change in your medication: Start Clopidogrel 75 mg by mouth daily   *If you need a refill on your cardiac medications before your next appointment, please call your pharmacy*   Lab Work: Lab work to be done today--BMP and CBC If you have labs (blood work) drawn today and your tests are completely normal, you will receive your results only by: [place] (if you have MyChart) OR A paper copy in the mail If you have any lab test that is abnormal or we need to change your treatment, we will call you to review the results.   Testing/Procedures: Your physician has requested that you have a cardiac catheterization. Cardiac catheterization is used to diagnose and/or treat various heart conditions. Doctors may recommend this procedure for a number of different reasons. The most common reason is to evaluate chest pain. Chest pain can be a symptom of coronary artery disease (CAD), and cardiac catheterization can show whether plaque is narrowing or blocking your heart's arteries. This procedure is also used to evaluate the valves, as well as measure the blood flow and oxygen levels in different parts of your heart. For further information please visit [email or web address]. Please follow instruction sheet, as given. Scheduled for 12/11/22   Follow-Up: At [facility], you and your health needs are our priority.  As part of our continuing mission to provide you with exceptional heart care, we have created designated Provider Care Teams.  These Care Teams include your primary Cardiologist (physician) and Advanced Practice Providers (APPs -  Physician Assistants and Nurse Practitioners) who all work together to provide you with the care you need, when you need it.  We recommend signing up for the patient portal called "MyChart".  Sign up information is provided on this After Visit Summary.  MyChart is used to connect  with patients for Virtual Visits (Telemedicine).  Patients are able to view lab/test results, encounter notes, upcoming appointments, etc.  Non-urgent messages can be sent to your provider as well.   To learn more about what you can do with MyChart, go to [email or web address].    Your next appointment:   To be arranged after procedure  Provider:   Dr Irish Lack          Cardiac/Peripheral Catheterization   You are scheduled for a Cardiac Catheterization on Tuesday, February 6 with Dr. Larae Grooms.  1. Please arrive at the Main Entrance A at [facility]: [place], [place] [address] on February 6 at 5:30 AM (This time is two hours before your procedure to ensure your preparation). Free valet parking service is available. You will check in at ADMITTING. The support person will be asked to wait in the waiting room.  It is OK to have someone drop you off and come back when you are ready to be discharged.        Special note: Every effort is made to have your procedure done on time. Please understand that emergencies sometimes delay scheduled procedures.   . 2. Diet: Do not eat solid foods after midnight.  You may have clear liquids until 5 AM the day of the procedure.  3. Labs: done in office today  4. Medication instructions in preparation for your procedure:   Contrast Allergy: No  Do not take any diabetes medication the morning of the procedure. Do not take metformin for 48 hours after the procedure.    On the morning of  your procedure, take Aspirin 81 mg  and Clopidogrel 75 mg and any morning medicines NOT listed above.  You may use sips of water.  5. Plan to go home the same day, you will only stay overnight if medically necessary. 6. You MUST have a responsible adult to drive you home. 7. An adult MUST be with you the first 24 hours after you arrive home. 8. Bring a current list of your medications, and the last time and date medication taken. 9.  Bring ID and current insurance cards. 10.Please wear clothes that are easy to get on and off and wear slip-on shoes.  Thank you for allowing [place] to care for you!   -- [facility]

## 2022-12-08 ENCOUNTER — Telehealth: Payer: Self-pay | Admitting: Interventional Cardiology

## 2022-12-08 NOTE — Telephone Encounter (Signed)
Angel Reynolds is scheduled for Tuesday.  May have to postpone if we can't confirm a recent GI w/u, hemoccult testing.  Would also ask if she has had any GI bleeding since starting clopidogrel last week.

## 2022-12-10 ENCOUNTER — Telehealth: Payer: Self-pay | Admitting: *Deleted

## 2022-12-10 NOTE — Telephone Encounter (Signed)
Call placed to patient to discuss any recent GI workup, hemoccult testing.  Patient reports she was told many years ago that she had anemia and was prescribed iron supplements, no longer takes iron supplements.  She reports last colonoscopy was probably 10 years ago, reports she has not seen any blood in stool recently or had significant change to bowel habits.   Patient is aware that I will forward this message to Dr Irish Lack for his review and let her know his recommendation for proceeding with Coronary Stent tomorrow, 12/11/22.

## 2022-12-10 NOTE — Telephone Encounter (Signed)
Coronary Stent scheduled at [facility] for Tuesday December 11, 2022 7:30 AM  Arrival time and place: [place] Entrance A at: 5:30 AM  Nothing to eat after midnight prior to procedure, clear liquids until 5 AM day of procedure.  Medication instructions: -Hold:  Metformin-day of procedure and 48 hours post procedure  Glimepiride-AM of procedure -Except hold medications usual morning medications can be taken with sips of water including aspirin 81 mg and Plavix 75 mg  Confirmed patient has responsible adult to drive home post procedure and be with patient first 24 hours after arriving home.  Patient reports no new symptoms concerning for COVID-19 in the past 10 days.  Reviewed procedure instructions with patient.

## 2022-12-10 NOTE — Telephone Encounter (Signed)
Patient reports history of anemia for many years, will plan to proceed with PCI tomorrow, 12/11/22.

## 2022-12-11 ENCOUNTER — Observation Stay (HOSPITAL_COMMUNITY)
Admission: RE | Admit: 2022-12-11 | Discharge: 2022-12-12 | Disposition: A | Discharge status: Home / Self Care | Payer: Medicare HMO | Attending: Interventional Cardiology | Admitting: Interventional Cardiology

## 2022-12-11 ENCOUNTER — Encounter (HOSPITAL_COMMUNITY): Payer: Self-pay | Admitting: Interventional Cardiology

## 2022-12-11 ENCOUNTER — Ambulatory Visit (HOSPITAL_COMMUNITY)
Admission: RE | Disposition: A | Discharge status: Home / Self Care | Payer: Self-pay | Source: Home / Self Care | Attending: Interventional Cardiology

## 2022-12-11 ENCOUNTER — Other Ambulatory Visit: Payer: Self-pay

## 2022-12-11 ENCOUNTER — Observation Stay (HOSPITAL_COMMUNITY): Payer: Medicare HMO

## 2022-12-11 DIAGNOSIS — Z7984 Long term (current) use of oral hypoglycemic drugs: Secondary | ICD-10-CM | POA: Diagnosis not present

## 2022-12-11 DIAGNOSIS — E1151 Type 2 diabetes mellitus with diabetic peripheral angiopathy without gangrene: Secondary | ICD-10-CM | POA: Insufficient documentation

## 2022-12-11 DIAGNOSIS — R4182 Altered mental status, unspecified: Secondary | ICD-10-CM | POA: Insufficient documentation

## 2022-12-11 DIAGNOSIS — I6782 Cerebral ischemia: Secondary | ICD-10-CM | POA: Diagnosis not present

## 2022-12-11 DIAGNOSIS — G319 Degenerative disease of nervous system, unspecified: Secondary | ICD-10-CM | POA: Diagnosis not present

## 2022-12-11 DIAGNOSIS — I25118 Atherosclerotic heart disease of native coronary artery with other forms of angina pectoris: Secondary | ICD-10-CM

## 2022-12-11 DIAGNOSIS — Z79899 Other long term (current) drug therapy: Secondary | ICD-10-CM | POA: Insufficient documentation

## 2022-12-11 DIAGNOSIS — D649 Anemia, unspecified: Secondary | ICD-10-CM | POA: Insufficient documentation

## 2022-12-11 DIAGNOSIS — I10 Essential (primary) hypertension: Secondary | ICD-10-CM | POA: Diagnosis not present

## 2022-12-11 DIAGNOSIS — E785 Hyperlipidemia, unspecified: Secondary | ICD-10-CM | POA: Diagnosis present

## 2022-12-11 DIAGNOSIS — I2510 Atherosclerotic heart disease of native coronary artery without angina pectoris: Principal | ICD-10-CM | POA: Insufficient documentation

## 2022-12-11 DIAGNOSIS — Z955 Presence of coronary angioplasty implant and graft: Secondary | ICD-10-CM

## 2022-12-11 DIAGNOSIS — Z87891 Personal history of nicotine dependence: Secondary | ICD-10-CM | POA: Diagnosis not present

## 2022-12-11 DIAGNOSIS — Z7982 Long term (current) use of aspirin: Secondary | ICD-10-CM | POA: Insufficient documentation

## 2022-12-11 HISTORY — PX: CORONARY ATHERECTOMY: CATH118238

## 2022-12-11 HISTORY — PX: LEFT HEART CATH AND CORONARY ANGIOGRAPHY: CATH118249

## 2022-12-11 LAB — IRON AND TIBC
Iron: 48 ug/dL (ref 28–170)
Saturation Ratios: 13 % (ref 10.4–31.8)
TIBC: 360 ug/dL (ref 250–450)
UIBC: 312 ug/dL

## 2022-12-11 LAB — POCT ACTIVATED CLOTTING TIME
Activated Clotting Time: 282 seconds
Activated Clotting Time: 293 seconds
Activated Clotting Time: 244 seconds

## 2022-12-11 LAB — FERRITIN: Ferritin: 6 ng/mL — ABNORMAL LOW (ref 11–307)

## 2022-12-11 LAB — GLUCOSE, CAPILLARY
Glucose-Capillary: 46 mg/dL — ABNORMAL LOW (ref 70–99)
Glucose-Capillary: 136 mg/dL — ABNORMAL HIGH (ref 70–99)
Glucose-Capillary: 46 mg/dL — ABNORMAL LOW (ref 70–99)
Glucose-Capillary: 361 mg/dL — ABNORMAL HIGH (ref 70–99)
Glucose-Capillary: 59 mg/dL — ABNORMAL LOW (ref 70–99)
Glucose-Capillary: 130 mg/dL — ABNORMAL HIGH (ref 70–99)

## 2022-12-11 LAB — RETICULOCYTES
Immature Retic Fract: 18 % — ABNORMAL HIGH (ref 2.3–15.9)
RBC.: 3.11 MIL/uL — ABNORMAL LOW (ref 3.87–5.11)
Retic Count, Absolute: 47 10*3/uL (ref 19.0–186.0)
Retic Ct Pct: 1.5 % (ref 0.4–3.1)

## 2022-12-11 LAB — FOLATE: Folate: 15.4 ng/mL (ref >5.9)

## 2022-12-11 LAB — VITAMIN B12: Vitamin B-12: 107 pg/mL — ABNORMAL LOW (ref 180–914)

## 2022-12-11 SURGERY — CORONARY ATHERECTOMY
Anesthesia: LOCAL

## 2022-12-11 MED ORDER — MIDAZOLAM HCL 2 MG/2ML IJ SOLN
INTRAMUSCULAR | Status: AC
  Filled 2022-12-11: qty 2

## 2022-12-11 MED ORDER — HYDRALAZINE HCL 20 MG/ML IJ SOLN: 10.0000 mg | INTRAMUSCULAR | Status: AC | PRN

## 2022-12-11 MED ORDER — SODIUM CHLORIDE 0.9% FLUSH
3.0000 mL | Freq: Two times a day (BID) | INTRAVENOUS | Status: DC
  Administered 2022-12-11: 3 mL via INTRAVENOUS

## 2022-12-11 MED ORDER — ESCITALOPRAM OXALATE 10 MG PO TABS
10.0000 mg | ORAL_TABLET | Freq: Every evening | ORAL | Status: DC
  Administered 2022-12-11: 10 mg via ORAL
  Filled 2022-12-11: qty 1

## 2022-12-11 MED ORDER — GLIMEPIRIDE 4 MG PO TABS
4.0000 mg | ORAL_TABLET | Freq: Two times a day (BID) | ORAL | Status: DC
  Administered 2022-12-11 – 2022-12-12 (×3): 4 mg via ORAL
  Filled 2022-12-11 (×4): qty 1

## 2022-12-11 MED ORDER — IOHEXOL 350 MG/ML SOLN
INTRAVENOUS | Status: DC | PRN
  Administered 2022-12-11: 120 mL

## 2022-12-11 MED ORDER — LIDOCAINE HCL (PF) 1 % IJ SOLN
INTRAMUSCULAR | Status: AC
  Filled 2022-12-11: qty 30

## 2022-12-11 MED ORDER — SODIUM CHLORIDE 0.9% FLUSH
3.0000 mL | Freq: Two times a day (BID) | INTRAVENOUS | Status: DC
  Administered 2022-12-11 – 2022-12-12 (×2): 3 mL via INTRAVENOUS

## 2022-12-11 MED ORDER — NITROGLYCERIN 0.4 MG SL SUBL: 0.4000 mg | SUBLINGUAL_TABLET | SUBLINGUAL | Status: DC | PRN

## 2022-12-11 MED ORDER — VIPERSLIDE LUBRICANT OPTIME
TOPICAL | Status: DC | PRN
  Administered 2022-12-11: 20 mL via SURGICAL_CAVITY

## 2022-12-11 MED ORDER — VERAPAMIL HCL 2.5 MG/ML IV SOLN
INTRAVENOUS | Status: AC
  Filled 2022-12-11: qty 2

## 2022-12-11 MED ORDER — MIDAZOLAM HCL 2 MG/2ML IJ SOLN
INTRAMUSCULAR | Status: DC | PRN
  Administered 2022-12-11 (×2): 1 mg via INTRAVENOUS
  Administered 2022-12-11: 2 mg via INTRAVENOUS
  Administered 2022-12-11: 1 mg via INTRAVENOUS

## 2022-12-11 MED ORDER — HEPARIN (PORCINE) IN NACL 1000-0.9 UT/500ML-% IV SOLN
INTRAVENOUS | Status: AC
  Filled 2022-12-11: qty 500

## 2022-12-11 MED ORDER — SODIUM CHLORIDE 0.9 % IV SOLN: 250.0000 mL | INTRAVENOUS | Status: DC | PRN

## 2022-12-11 MED ORDER — ONDANSETRON HCL 4 MG/2ML IJ SOLN
4.0000 mg | Freq: Four times a day (QID) | INTRAMUSCULAR | Status: DC | PRN
  Administered 2022-12-11: 4 mg via INTRAVENOUS

## 2022-12-11 MED ORDER — ASPIRIN 81 MG PO TABS: 81.0000 mg | ORAL_TABLET | Freq: Every evening | ORAL | Status: DC

## 2022-12-11 MED ORDER — DEXTROSE 50 % IV SOLN
INTRAVENOUS | Status: AC
  Administered 2022-12-11: 25 g via INTRAVENOUS
  Filled 2022-12-11: qty 50

## 2022-12-11 MED ORDER — VERAPAMIL HCL 2.5 MG/ML IV SOLN
INTRAVENOUS | Status: DC | PRN
  Administered 2022-12-11 (×2): 10 mL via INTRA_ARTERIAL

## 2022-12-11 MED ORDER — SODIUM CHLORIDE 0.9% FLUSH: 3.0000 mL | INTRAVENOUS | Status: DC | PRN

## 2022-12-11 MED ORDER — SODIUM CHLORIDE 0.9 % WEIGHT BASED INFUSION
1.0000 mL/kg/h | INTRAVENOUS | Status: DC
  Administered 2022-12-11: 1 mL/kg/h via INTRAVENOUS

## 2022-12-11 MED ORDER — ATORVASTATIN CALCIUM 80 MG PO TABS
80.0000 mg | ORAL_TABLET | Freq: Every evening | ORAL | Status: DC
  Administered 2022-12-11: 80 mg via ORAL
  Filled 2022-12-11: qty 1

## 2022-12-11 MED ORDER — LIDOCAINE HCL (PF) 1 % IJ SOLN
INTRAMUSCULAR | Status: DC | PRN
  Administered 2022-12-11: 5 mL

## 2022-12-11 MED ORDER — SODIUM CHLORIDE 0.9 % IV SOLN: INTRAVENOUS | Status: AC

## 2022-12-11 MED ORDER — HYDROCODONE-ACETAMINOPHEN 5-325 MG PO TABS
ORAL_TABLET | ORAL | Status: AC
  Filled 2022-12-11: qty 1

## 2022-12-11 MED ORDER — FENTANYL CITRATE (PF) 100 MCG/2ML IJ SOLN
INTRAMUSCULAR | Status: AC
  Filled 2022-12-11: qty 2

## 2022-12-11 MED ORDER — LABETALOL HCL 5 MG/ML IV SOLN: 10.0000 mg | INTRAVENOUS | Status: AC | PRN

## 2022-12-11 MED ORDER — CLOPIDOGREL BISULFATE 75 MG PO TABS: 75.0000 mg | ORAL_TABLET | ORAL | Status: DC

## 2022-12-11 MED ORDER — METOPROLOL TARTRATE 25 MG PO TABS
25.0000 mg | ORAL_TABLET | Freq: Two times a day (BID) | ORAL | Status: DC
  Administered 2022-12-11 – 2022-12-12 (×2): 25 mg via ORAL
  Filled 2022-12-11 (×2): qty 1

## 2022-12-11 MED ORDER — DEXTROSE 50 % IV SOLN: 25.0000 g | Freq: Once | INTRAVENOUS | Status: AC

## 2022-12-11 MED ORDER — VITAMIN D 25 MCG (1000 UNIT) PO TABS
1000.0000 [IU] | ORAL_TABLET | Freq: Every day | ORAL | Status: DC
  Administered 2022-12-11 – 2022-12-12 (×2): 1000 [IU] via ORAL
  Filled 2022-12-11 (×2): qty 1

## 2022-12-11 MED ORDER — DEXTROSE 50 % IV SOLN
1.0000 | Freq: Once | INTRAVENOUS | Status: AC
  Administered 2022-12-11: 25 mL via INTRAVENOUS

## 2022-12-11 MED ORDER — ASPIRIN 81 MG PO CHEW
81.0000 mg | CHEWABLE_TABLET | Freq: Every day | ORAL | Status: DC
  Administered 2022-12-12: 81 mg via ORAL
  Filled 2022-12-11: qty 1

## 2022-12-11 MED ORDER — ISOSORBIDE MONONITRATE ER 60 MG PO TB24
60.0000 mg | ORAL_TABLET | Freq: Every day | ORAL | Status: DC
  Administered 2022-12-12: 60 mg via ORAL
  Filled 2022-12-11: qty 1

## 2022-12-11 MED ORDER — NITROGLYCERIN 1 MG/10 ML FOR IR/CATH LAB
INTRA_ARTERIAL | Status: AC
  Filled 2022-12-11: qty 10

## 2022-12-11 MED ORDER — HEPARIN SODIUM (PORCINE) 1000 UNIT/ML IJ SOLN
INTRAMUSCULAR | Status: DC | PRN
  Administered 2022-12-11 (×2): 2000 [IU] via INTRAVENOUS
  Administered 2022-12-11: 9000 [IU] via INTRAVENOUS

## 2022-12-11 MED ORDER — FENTANYL CITRATE (PF) 100 MCG/2ML IJ SOLN
INTRAMUSCULAR | Status: DC | PRN
  Administered 2022-12-11 (×2): 25 ug via INTRAVENOUS
  Administered 2022-12-11: 25 ug
  Administered 2022-12-11 (×2): 25 ug via INTRAVENOUS

## 2022-12-11 MED ORDER — SODIUM CHLORIDE 0.9 % WEIGHT BASED INFUSION
3.0000 mL/kg/h | INTRAVENOUS | Status: DC
  Administered 2022-12-11: 3 mL/kg/h via INTRAVENOUS

## 2022-12-11 MED ORDER — DEXTROSE 50 % IV SOLN
INTRAVENOUS | Status: AC
  Filled 2022-12-11: qty 50

## 2022-12-11 MED ORDER — TRAMADOL HCL 50 MG PO TABS: 50.0000 mg | ORAL_TABLET | Freq: Three times a day (TID) | ORAL | Status: DC | PRN

## 2022-12-11 MED ORDER — HEPARIN SODIUM (PORCINE) 1000 UNIT/ML IJ SOLN
INTRAMUSCULAR | Status: AC
  Filled 2022-12-11: qty 10

## 2022-12-11 MED ORDER — HYDROCODONE-ACETAMINOPHEN 5-325 MG PO TABS
1.0000 | ORAL_TABLET | Freq: Three times a day (TID) | ORAL | Status: DC | PRN
  Administered 2022-12-11 – 2022-12-12 (×2): 1 via ORAL
  Filled 2022-12-11: qty 1

## 2022-12-11 MED ORDER — ONDANSETRON HCL 4 MG/2ML IJ SOLN
INTRAMUSCULAR | Status: AC
  Filled 2022-12-11: qty 2

## 2022-12-11 MED ORDER — CLOPIDOGREL BISULFATE 75 MG PO TABS
75.0000 mg | ORAL_TABLET | Freq: Every day | ORAL | Status: DC
  Administered 2022-12-12: 75 mg via ORAL
  Filled 2022-12-11: qty 1

## 2022-12-11 MED ORDER — HEPARIN (PORCINE) IN NACL 1000-0.9 UT/500ML-% IV SOLN
INTRAVENOUS | Status: DC | PRN
  Administered 2022-12-11 (×2): 500 mL

## 2022-12-11 MED ORDER — FAMOTIDINE 20 MG PO TABS: 20.0000 mg | ORAL_TABLET | Freq: Two times a day (BID) | ORAL | Status: DC | PRN

## 2022-12-11 MED ORDER — ACETAMINOPHEN 325 MG PO TABS
650.0000 mg | ORAL_TABLET | ORAL | Status: DC | PRN
  Administered 2022-12-12 (×2): 650 mg via ORAL
  Filled 2022-12-11 (×2): qty 2

## 2022-12-11 MED ORDER — ASPIRIN 81 MG PO CHEW: 81.0000 mg | CHEWABLE_TABLET | ORAL | Status: DC

## 2022-12-11 MED ORDER — SEMAGLUTIDE (1 MG/DOSE) 4 MG/3ML ~~LOC~~ SOPN: 1.0000 mg | PEN_INJECTOR | SUBCUTANEOUS | Status: DC

## 2022-12-11 MED ORDER — CLOPIDOGREL BISULFATE 75 MG PO TABS: 75.0000 mg | ORAL_TABLET | Freq: Every day | ORAL | Status: DC

## 2022-12-11 MED ORDER — LISINOPRIL 10 MG PO TABS
10.0000 mg | ORAL_TABLET | Freq: Every day | ORAL | Status: DC
  Administered 2022-12-12: 10 mg via ORAL
  Filled 2022-12-11: qty 1

## 2022-12-11 MED ORDER — MUSCLE RUB 10-15 % EX CREA: 1.0000 | TOPICAL_CREAM | Freq: Every day | CUTANEOUS | Status: DC | PRN

## 2022-12-11 SURGICAL SUPPLY — 26 items
BALL SAPPHIRE NC24 3.25X10 (BALLOONS) ×1
BALL SAPPHIRE NC24 3.5X15 (BALLOONS) ×1
BALLN SAPPHIRE 2.75X15 (BALLOONS) ×1
BALLN WOLVERINE 3.00X10 (BALLOONS) ×1
CATH INFINITI JR4 5F (CATHETERS) ×1
CATH LAUNCHER 6FR EBU3.5 (CATHETERS) ×1
CATH TELEPORT (CATHETERS) ×1
CROWN DIAMONDBACK CLASSIC 1.25 (BURR) ×1
ELECT DEFIB PAD ADLT CADENCE (PAD) ×1
GLIDESHEATH SLEND SS 6F .021 (SHEATH) ×1
INQWIRE 1.5J .035X260CM (WIRE) ×1
KIT ENCORE 26 ADVANTAGE (KITS) ×1
KIT HEART LEFT (KITS) ×1
KIT HEMO VALVE WATCHDOG (MISCELLANEOUS) ×1
LUBRICANT VIPERSLIDE CORONARY (MISCELLANEOUS) ×1
MAT PREVALON FULL STRYKER (MISCELLANEOUS) ×1
PACK CARDIAC CATHETERIZATION (CUSTOM PROCEDURE TRAY) ×1
SHEATH PROBE COVER 6X72 (BAG) ×1
STENT SYNERGY XD 3.0X32 (Permanent Stent) ×1 IMPLANT
TRANSDUCER W/STOPCOCK (MISCELLANEOUS) ×1
TUBING CIL FLEX 10 FLL-RA (TUBING) ×1
WIRE ASAHI MIRACLEBROS-3 300CM (WIRE) ×1
WIRE ASAHI PROWATER 300CM (WIRE) ×1
WIRE FIGHTER CROSSING 190CM (WIRE) ×1
WIRE RUNTHROUGH .014X180CM (WIRE) ×1
WIRE VIPERWIRE COR FLEX .012 (WIRE) ×1

## 2022-12-11 NOTE — Interval H&P Note (Signed)
Cath Lab Visit (complete for each Cath Lab visit)  Clinical Evaluation Leading to the Procedure:   ACS: No.  Non-ACS:    Anginal Classification: CCS III  Anti-ischemic medical therapy: Maximal Therapy (2 or more classes of medications)  Non-Invasive Test Results: No non-invasive testing performed  Prior CABG: No previous CABG   Declined for CABG in 2023.  PLanned CSI of LAD.  Chronic anemia.  Had been on iron in the past but stopped this.  Will check iron studies and add back IV or PO iron post procedure.    History and Physical Interval Note:  12/11/2022 7:35 AM  Angel Reynolds  has presented today for surgery, with the diagnosis of CAD.  The various methods of treatment have been discussed with the patient and family. After consideration of risks, benefits and other options for treatment, the patient has consented to  Procedure(s): CORONARY ATHERECTOMY (N/A) as a surgical intervention.  The patient's history has been reviewed, patient examined, no change in status, stable for surgery.  I have reviewed the patient's chart and labs.  Questions were answered to the patient's satisfaction.     Larae Grooms

## 2022-12-11 NOTE — Progress Notes (Signed)
Patient has just arrived from the cath lab.  Per RN she was chewing on her cords and not speaking very much.  When I arrived she was agitated and delirious.  She can say a few sentences but otherwise mumbling.  No focal deficits noted  Almyra Deforest PA also at bedside review record from prior cath when she had a similar event.    BP 148/86  HR 99  RR 16  O2 sat 97% on RA

## 2022-12-11 NOTE — Progress Notes (Signed)
    Patient was received and CBG was checked at 1100, it was low at 46 mg/dl, she was given 12 oz of orange juice and peanut butter with crackers. Patient stated that her CBG was in 40's that morning at home.  Repeat CBG was 59 mg/dl, and at this point she was given half of amp of D 50 %. CBG was 361 at this point. Patient was very restless most of time, stating that she wants to get up.

## 2022-12-11 NOTE — Progress Notes (Addendum)
Paged by the nurse to the bedside due to agitation and acute delirium post cath. She received Versed and Fentanyl during cath.  Ex-husband at bedside.  Patient appears to be agitated, shaking, mumbling without making any sense.  Looking through the record, patient had a similar event in May 2023 post cath as well.  She underwent full neurowork-up including MRI of the brain and EEG.  Neurology was involved at the time and felt the patient had paradoxical effect from benzodiazepine and sedation.  Patient did not follow command with me. Likely will not stay still enough for CT or MRI, however hopefully once sedation and benzo wears off a little more, can obtain CT of head and do a full neuro exam. BP stable. HR 90s in sinus. O2 sat 90s. Received OJ, peanut butter cracker and 1/2 amp of D50% post cath due to hypoglycemia.    Addendum: Seen patient again, calmer, lying down, still moaning and barely following command. Able to answer yes when her ex-husband calls her. Will order head ct wo contrast to make sure she does have large stroke or brain bleed.

## 2022-12-12 ENCOUNTER — Encounter (HOSPITAL_COMMUNITY): Payer: Self-pay | Admitting: Interventional Cardiology

## 2022-12-12 DIAGNOSIS — I10 Essential (primary) hypertension: Secondary | ICD-10-CM | POA: Diagnosis not present

## 2022-12-12 DIAGNOSIS — Z79899 Other long term (current) drug therapy: Secondary | ICD-10-CM | POA: Diagnosis not present

## 2022-12-12 DIAGNOSIS — I2510 Atherosclerotic heart disease of native coronary artery without angina pectoris: Secondary | ICD-10-CM | POA: Diagnosis not present

## 2022-12-12 DIAGNOSIS — D649 Anemia, unspecified: Secondary | ICD-10-CM | POA: Insufficient documentation

## 2022-12-12 DIAGNOSIS — Z7984 Long term (current) use of oral hypoglycemic drugs: Secondary | ICD-10-CM | POA: Diagnosis not present

## 2022-12-12 DIAGNOSIS — I209 Angina pectoris, unspecified: Secondary | ICD-10-CM

## 2022-12-12 DIAGNOSIS — E1151 Type 2 diabetes mellitus with diabetic peripheral angiopathy without gangrene: Secondary | ICD-10-CM | POA: Diagnosis not present

## 2022-12-12 DIAGNOSIS — Z7982 Long term (current) use of aspirin: Secondary | ICD-10-CM | POA: Diagnosis not present

## 2022-12-12 DIAGNOSIS — R4182 Altered mental status, unspecified: Secondary | ICD-10-CM | POA: Diagnosis not present

## 2022-12-12 DIAGNOSIS — Z87891 Personal history of nicotine dependence: Secondary | ICD-10-CM | POA: Diagnosis not present

## 2022-12-12 LAB — CBC
HCT: 29.8 % — ABNORMAL LOW (ref 36.0–46.0)
Hemoglobin: 9.8 g/dL — ABNORMAL LOW (ref 12.0–15.0)
MCH: 28.3 pg (ref 26.0–34.0)
MCHC: 32.9 g/dL (ref 30.0–36.0)
MCV: 86.1 fL (ref 80.0–100.0)
Platelets: 351 10*3/uL (ref 150–400)
RBC: 3.46 MIL/uL — ABNORMAL LOW (ref 3.87–5.11)
RDW: 14.2 % (ref 11.5–15.5)
WBC: 8.3 10*3/uL (ref 4.0–10.5)
nRBC: 0 % (ref 0.0–0.2)

## 2022-12-12 LAB — BASIC METABOLIC PANEL
Anion gap: 10 (ref 5–15)
BUN: 12 mg/dL (ref 8–23)
CO2: 22 mmol/L (ref 22–32)
Calcium: 8.6 mg/dL — ABNORMAL LOW (ref 8.9–10.3)
Chloride: 103 mmol/L (ref 98–111)
Creatinine, Ser: 0.91 mg/dL (ref 0.44–1.00)
GFR, Estimated: >60 mL/min (ref >60)
Glucose, Bld: 130 mg/dL — ABNORMAL HIGH (ref 70–99)
Potassium: 4.1 mmol/L (ref 3.5–5.1)
Sodium: 135 mmol/L (ref 135–145)

## 2022-12-12 LAB — GLUCOSE, CAPILLARY
Glucose-Capillary: 124 mg/dL — ABNORMAL HIGH (ref 70–99)
Glucose-Capillary: 150 mg/dL — ABNORMAL HIGH (ref 70–99)
Glucose-Capillary: 222 mg/dL — ABNORMAL HIGH (ref 70–99)

## 2022-12-12 MED ORDER — ORAL CARE MOUTH RINSE: 15.0000 mL | OROMUCOSAL | Status: DC | PRN

## 2022-12-12 MED ORDER — SODIUM CHLORIDE 0.9 % IV SOLN
500.0000 mg | Freq: Once | INTRAVENOUS | Status: AC
  Administered 2022-12-12: 500 mg via INTRAVENOUS
  Filled 2022-12-12: qty 500

## 2022-12-12 NOTE — Discharge Summary (Addendum)
Discharge Summary    Patient ID: Angel Reynolds MRN: [id number]; DOB: November 18, 1955  Admit date: 12/11/2022 Discharge date: 12/12/2022  PCP:  Simone Curia, MD   [facility] Providers Cardiologist:  Gypsy Balsam, MD     Discharge Diagnoses    Principal Problem:   CAD (coronary artery disease) Active Problems:   Hyperlipidemia   Anemia  Diagnostic Studies/Procedures    Cath: 12/11/2022    Prox LAD lesion is 35% stenosed.   Prox RCA lesion is 100% stenosed. L-R collaterals.   1st Mrg lesion is 85% stenosed.   RPDA lesion is 100% stenosed.   Dist Cx lesion is 100% stenosed. Unable to cross with miracle brothers wire.   Mid LAD lesion is 80% stenosed.   A drug-eluting stent was successfully placed using a STENT SYNERGY XD 3.0X32, postdilated to 3.5 mm.   Post intervention, there is a 0% residual stenosis.   The left ventricular systolic function is normal.   LV end diastolic pressure is normal.   The left ventricular ejection fraction is 50-55% by visual estimate.   There is no aortic valve stenosis.   Successful atherectomy and stenting of the mid LAD with a 3.0 x 32 Synergy drug-eluting stent postdilated to 3.5 mm.  Unable to cross the circumflex occlusion.  Patient did get somewhat restless towards the end of the procedure and sedation did not seem to be helping.  Will watch overnight.   Iron studies pending.  If iron is low, would see if she is a candidate for IV iron to help with her chronic anemia. _____________   History of Present Illness     Angel Reynolds is a 67 y.o. female with PMH of HLD, HTN, DM2, anemia who is followed by Dr. Bing Matter as an outpatient. She Underwent prior cardiac cath showing 80 to 90% proximal LAD, 90% obtuse marginal branch, completely occluded right coronary artery with collateralization. After cath, she had a psychological/neurological event with extensive testing which was found to be the result of withdrawal of vicodin. She was  evaluated by [facility] and felt not to be a good candidate for CABG. Presented to the office with Dr. Bing Matter with recurrent chest pain requiring SL nitro and was referred to interventional cardiology for options. See in the office with Dr. Eldridge Dace 1/31 with recurrent episodes of chest pain that have required nitro. She was started on plavix and set up for outpatient cardiac cath.   Hospital Course     Underwent outpatient cardiac catheterization noted above with successful atherectomy and stenting of the mid LAD with DES x 1.  Unable to cross circumflex lesion.  Continued on DAPT with aspirin/Plavix with recommendations for at least 6 months. Given she was restless towards the end of the procedure without sedation helping she was watched overnight.  Her iron studies showed iron 48, 6, sat ratio was 13.  She was given IV iron prior to discharge.  She was without chest pain post cath.  Evaluated by cardiac rehab. Ambulated without recurrent chest pain.   General: Well developed, well nourished, female appearing in no acute distress. Head: Normocephalic, atraumatic.  Neck: Supple without bruits, JVD. Lungs:  Resp regular and unlabored, CTA. Heart: RRR, S1, S2, no S3, S4, or murmur; no rub. Abdomen: Soft, non-tender, non-distended with normoactive bowel sounds. No hepatomegaly. No rebound/guarding. No obvious abdominal masses. Extremities: No clubbing, cyanosis, edema. Distal pedal pulses are 2+ bilaterally. Right radial cath site stable without bruising or hematoma Neuro: Alert and  oriented X 3. Moves all extremities spontaneously. Psych: Normal affect.  Patient was seen by Dr. Ali Lowe and deemed stable for discharge home. Follow up in the arranged. Educated by pharmD prior discharge.   Did the patient have an acute coronary syndrome (MI, NSTEMI, STEMI, etc) this admission?:  No                               Did the patient have a percutaneous coronary intervention (stent / angioplasty)?:  Yes.      Cath/PCI Registry Performance & Quality Measures: Aspirin prescribed? - Yes ADP Receptor Inhibitor (Plavix/Clopidogrel, Brilinta/Ticagrelor or Effient/Prasugrel) prescribed (includes medically managed patients)? - Yes High Intensity Statin (Lipitor 40-80mg  or Crestor 20-40mg ) prescribed? - Yes For EF <40%, was ACEI/ARB prescribed? - Yes For EF <40%, Aldosterone Antagonist (Spironolactone or Eplerenone) prescribed? - Not Applicable (EF >/= 60%) Cardiac Rehab Phase II ordered? - Yes       The patient will be scheduled for a TOC follow up appointment in 10-14 days.  A message has been sent to the [facility] and Scheduling Pool at the office where the patient should be seen for follow up.  _____________  Discharge Vitals Blood pressure 129/73, pulse 76, temperature 98.1 F (36.7 C), temperature source Oral, resp. rate 19, height 5\' 2"  (1.575 m), weight 78.9 kg, SpO2 98 %.  Filed Weights   12/11/22 0653  Weight: 78.9 kg    Labs & Radiologic Studies    CBC Recent Labs    12/12/22 0245  WBC 8.3  HGB 9.8*  HCT 29.8*  MCV 86.1  PLT 109   Basic Metabolic Panel Recent Labs    12/12/22 0245  NA 135  K 4.1  CL 103  CO2 22  GLUCOSE 130*  BUN 12  CREATININE 0.91  CALCIUM 8.6*   Liver Function Tests No results for input(s): "AST", "ALT", "ALKPHOS", "BILITOT", "PROT", "ALBUMIN" in the last 72 hours. No results for input(s): "LIPASE", "AMYLASE" in the last 72 hours. High Sensitivity Troponin:   No results for input(s): "TROPONINIHS" in the last 720 hours.  BNP Invalid input(s): "POCBNP" D-Dimer No results for input(s): "DDIMER" in the last 72 hours. Hemoglobin A1C No results for input(s): "HGBA1C" in the last 72 hours. Fasting Lipid Panel No results for input(s): "CHOL", "HDL", "LDLCALC", "TRIG", "CHOLHDL", "LDLDIRECT" in the last 72 hours. Thyroid Function Tests No results for input(s): "TSH", "T4TOTAL", "T3FREE", "THYROIDAB" in the last 72 hours.  Invalid input(s):  "FREET3" _____________  CT HEAD WO CONTRAST (5MM)  Result Date: 12/11/2022 CLINICAL DATA:  Initial evaluation for mental status change. EXAM: CT HEAD WITHOUT CONTRAST TECHNIQUE: Contiguous axial images were obtained from the base of the skull through the vertex without intravenous contrast. RADIATION DOSE REDUCTION: This exam was performed according to the departmental dose-optimization program which includes automated exposure control, adjustment of the mA and/or kV according to patient size and/or use of iterative reconstruction technique. COMPARISON:  Prior MRI from 03/19/2022. FINDINGS: Brain: Generalized age-related cerebral atrophy with moderately advanced chronic microvascular ischemic disease. No acute intracranial hemorrhage. No acute large vessel territory infarct. No mass lesion or midline shift. No hydrocephalus or extra-axial fluid collection. Vascular: No abnormal hyperdense vessel. Calcified atherosclerosis present at the skull base. Skull: Scalp soft tissues and calvarium demonstrate no acute finding. Sinuses/Orbits: Globes and orbital soft tissues demonstrate no acute finding. Sequelae of prior orbital floor fracture repair of the right. Paranasal sinuses are largely clear. No  significant mastoid effusion. Other: None. IMPRESSION: 1. No acute intracranial abnormality. 2. Generalized age-related cerebral atrophy with moderately advanced chronic microvascular ischemic disease. Electronically Signed   By: Jeannine Boga M.D.   On: 12/11/2022 23:35   CARDIAC CATHETERIZATION  Result Date: 12/11/2022   Prox LAD lesion is 35% stenosed.   Prox RCA lesion is 100% stenosed. L-R collaterals.   1st Mrg lesion is 85% stenosed.   RPDA lesion is 100% stenosed.   Dist Cx lesion is 100% stenosed. Unable to cross with miracle brothers wire.   Mid LAD lesion is 80% stenosed.   A drug-eluting stent was successfully placed using a STENT SYNERGY XD 3.0X32, postdilated to 3.5 mm.   Post intervention, there is  a 0% residual stenosis.   The left ventricular systolic function is normal.   LV end diastolic pressure is normal.   The left ventricular ejection fraction is 50-55% by visual estimate.   There is no aortic valve stenosis. Successful atherectomy and stenting of the mid LAD with a 3.0 x 32 Synergy drug-eluting stent postdilated to 3.5 mm.  Unable to cross the circumflex occlusion.  Patient did get somewhat restless towards the end of the procedure and sedation did not seem to be helping.  Will watch overnight. Iron studies pending.  If iron is low, would see if she is a candidate for IV iron to help with her chronic anemia.   Disposition   Pt is being discharged home today in good condition.  Follow-up Plans & Appointments     Follow-up Information     Park Liter, MD Follow up on 01/18/2023.   Specialty: Cardiology Why: at 10:40am for your follow up appt Contact information: [place] [address] [phone]                Discharge Instructions     Amb Referral to Cardiac Rehabilitation   Complete by: As directed    Diagnosis: Coronary Stents   After initial evaluation and assessments completed: Virtual Based Care may be provided alone or in conjunction with Phase 2 Cardiac Rehab based on patient barriers.: Yes   Intensive Cardiac Rehabilitation (ICR) [place] location only OR Traditional Cardiac Rehabilitation (TCR) *If criteria for ICR are not met will enroll in TCR [facility] only): Yes   Call MD for:  redness, tenderness, or signs of infection (pain, swelling, redness, odor or green/yellow discharge around incision site)   Complete by: As directed    Diet - low sodium heart healthy   Complete by: As directed    Discharge instructions   Complete by: As directed    Radial Site Care Refer to this sheet in the next few weeks. These instructions provide you with information on caring for yourself after your procedure. Your caregiver may also give you more specific  instructions. Your treatment has been planned according to current medical practices, but problems sometimes occur. Call your caregiver if you have any problems or questions after your procedure. HOME CARE INSTRUCTIONS You may shower the day after the procedure. Remove the bandage (dressing) and gently wash the site with plain soap and water. Gently pat the site dry.  Do not apply powder or lotion to the site.  Do not submerge the affected site in water for 3 to 5 days.  Inspect the site at least twice daily.  Do not flex or bend the affected arm for 24 hours.  No lifting over 5 pounds (2.3 kg) for 5 days after your  procedure.  Do not drive home if you are discharged the same day of the procedure. Have someone else drive you.  You may drive 24 hours after the procedure unless otherwise instructed by your caregiver.  What to expect: Any bruising will usually fade within 1 to 2 weeks.  Blood that collects in the tissue (hematoma) may be painful to the touch. It should usually decrease in size and tenderness within 1 to 2 weeks.  SEEK IMMEDIATE MEDICAL CARE IF: You have unusual pain at the radial site.  You have redness, warmth, swelling, or pain at the radial site.  You have drainage (other than a small amount of blood on the dressing).  You have chills.  You have a fever or persistent symptoms for more than 72 hours.  You have a fever and your symptoms suddenly get worse.  Your arm becomes pale, cool, tingly, or numb.  You have heavy bleeding from the site. Hold pressure on the site.   PLEASE DO NOT MISS ANY DOSES OF YOUR PLAVIX!!!!! Also keep a log of you blood pressures and bring back to your follow up appt. Please call the office with any questions.   Patients taking blood thinners should generally stay away from medicines like ibuprofen, Advil, Motrin, naproxen, and Aleve due to risk of stomach bleeding. You may take Tylenol as directed or talk to your primary doctor about  alternatives.   PLEASE ENSURE THAT YOU DO NOT RUN OUT OF YOUR PLAVIX. This medication is very important to remain on for at least 52months. IF you have issues obtaining this medication due to cost please CALL the office 3-5 business days prior to running out in order to prevent missing doses of this medication.   Increase activity slowly   Complete by: As directed         Discharge Medications   Allergies as of 12/12/2022       Reactions   Versed [midazolam] Other (See Comments)   Post cath after receiving versed, acute delirium and agitation, parodoxycal effect   Mobic [meloxicam] Palpitations   "heart flutters"         Medication List     TAKE these medications    aspirin 81 MG tablet Take 81 mg by mouth every evening.   atorvastatin 80 MG tablet Commonly known as: LIPITOR Take 80 mg by mouth every evening.   Cholecalciferol 25 MCG (1000 UT) capsule Take 1,000 Units by mouth in the morning.   clopidogrel 75 MG tablet Commonly known as: PLAVIX Take 1 tablet (75 mg total) by mouth daily.   clotrimazole-betamethasone cream Commonly known as: LOTRISONE Apply 1 application. topically daily as needed (Eczema).   escitalopram 10 MG tablet Commonly known as: LEXAPRO Take 10 mg by mouth every evening.   famotidine 20 MG tablet Commonly known as: PEPCID Take 20 mg by mouth every 12 (twelve) hours as needed for heartburn or indigestion.   glimepiride 4 MG tablet Commonly known as: AMARYL Take 4 mg by mouth 2 (two) times daily.   HYDROcodone-acetaminophen 5-325 MG tablet Commonly known as: NORCO/VICODIN Take 1 tablet by mouth every 8 (eight) hours as needed for pain.   Icy Hot 5 % Ptch Generic drug: Menthol (Topical Analgesic) Place 1 patch onto the skin daily as needed (pain.).   isosorbide mononitrate 60 MG 24 hr tablet Commonly known as: IMDUR Take 1 tablet (60 mg total) by mouth daily.   lisinopril 10 MG tablet Commonly known as: ZESTRIL Take 1 tablet  by mouth once daily   metFORMIN 850 MG tablet Commonly known as: GLUCOPHAGE Take 850 mg by mouth 3 (three) times daily.   metoprolol tartrate 25 MG tablet Commonly known as: LOPRESSOR Take 1 tablet (25 mg total) by mouth 2 (two) times daily.   nitroGLYCERIN 0.4 MG SL tablet Commonly known as: NITROSTAT Place 1 tablet (0.4 mg total) under the tongue every 5 (five) minutes as needed for chest pain.   Ozempic (1 MG/DOSE) 4 MG/3ML Sopn Generic drug: Semaglutide (1 MG/DOSE) Inject 1 mg into the skin once a week.   traMADol 50 MG tablet Commonly known as: ULTRAM Take 50 mg by mouth 3 (three) times daily as needed for pain.        Outstanding Labs/Studies   N/a   Duration of Discharge Encounter   Greater than 30 minutes including physician time.  Signed, Laverda Page, NP 12/12/2022, 10:08 AM   ATTENDING ATTESTATION:  After conducting a review of all available clinical information with the care team, interviewing the patient, and performing a physical exam, I agree with the findings and plan described in this note.   GEN: No acute distress.   HEENT:  MMM, no JVD, no scleral icterus Cardiac: RRR, no murmurs, rubs, or gallops.  Respiratory: Clear to auscultation bilaterally. GI: Soft, nontender, non-distended  MS: No edema; No deformity. Neuro:  Nonfocal  Vasc:  R radial site CDI  Patient doing well after uncomplicated PCI with atherectomy of LAD and failed PCI of subtotally occluded distal Lcx.  After the procedure, she developed agitation thought due to procedural sedation which has now cleared.  Discharge today on DAPT x 6 months after iron sucrose infusion completed.  Alverda Skeans, MD Pager [phone]

## 2022-12-12 NOTE — Progress Notes (Signed)
CARDIAC REHAB PHASE I    Pt assisted to bathroom, able to have BM and void. Would like to return to chair for breakfast and ambulate in later this morning. Pt tolerated ambulation to and from bathroom with no CP or SOB. Post stent education including site care, restrictions, risk factors, exercise guidelines, heart healthy diabetic diet, antiplatelet therapy importance and CRP2 reviewed with pt and family. All questions and concerns addressed. Will refer to [facility] for CRP2. Will continue to follow.   [id number]  Vanessa Barbara, RN BSN 12/12/2022 9:33 AM

## 2022-12-12 NOTE — Inpatient Diabetes Management (Signed)
Inpatient Diabetes Program Recommendations  AACE/ADA: New Consensus Statement on Inpatient Glycemic Control (2015)  Target Ranges:  Prepandial:   less than 140 mg/dL      Peak postprandial:   less than 180 mg/dL (1-2 hours)      Critically ill patients:  140 - 180 mg/dL   Lab Results  Component Value Date   GLUCAP 150 (H) 12/12/2022   HGBA1C 8.2 (H) 03/19/2022    Review of Glycemic Control  Latest Reference Range & Units 12/11/22 06:45 12/11/22 10:55 12/11/22 12:50 12/11/22 13:18 12/11/22 14:17 12/11/22 23:17 12/12/22 07:58  Glucose-Capillary 70 - 99 mg/dL 136 (H) 46 (L) 59 (L) 361 (H) 130 (H) 124 (H) 150 (H)   Diabetes history: DM 2 Outpatient Diabetes medications: Amaryl 4 mg bid, Metformin 850 mg tid, Ozempic in the past Current orders for Inpatient glycemic control:  Amaryl 4 mg bid  Inpatient Diabetes Program Recommendations:    Note: Hypoglycemia, pt on Amaryl, a Sulfonylurea known to cause hypoglycemia.   -  D/c all oral agents for glucose control and order CBGs and Novolog 0-15 units tid + hs scale.  Thanks,  Tama Headings RN, MSN, BC-ADM Inpatient Diabetes Coordinator Team Pager [phone] (8a-5p)

## 2022-12-14 DIAGNOSIS — F3341 Major depressive disorder, recurrent, in partial remission: Secondary | ICD-10-CM | POA: Diagnosis not present

## 2022-12-14 DIAGNOSIS — R748 Abnormal levels of other serum enzymes: Secondary | ICD-10-CM | POA: Diagnosis not present

## 2022-12-14 DIAGNOSIS — E669 Obesity, unspecified: Secondary | ICD-10-CM | POA: Diagnosis not present

## 2022-12-14 DIAGNOSIS — N390 Urinary tract infection, site not specified: Secondary | ICD-10-CM | POA: Diagnosis not present

## 2022-12-14 DIAGNOSIS — M545 Low back pain, unspecified: Secondary | ICD-10-CM | POA: Diagnosis not present

## 2022-12-14 DIAGNOSIS — I10 Essential (primary) hypertension: Secondary | ICD-10-CM | POA: Diagnosis not present

## 2022-12-14 DIAGNOSIS — I25110 Atherosclerotic heart disease of native coronary artery with unstable angina pectoris: Secondary | ICD-10-CM | POA: Diagnosis not present

## 2022-12-14 DIAGNOSIS — E1169 Type 2 diabetes mellitus with other specified complication: Secondary | ICD-10-CM | POA: Diagnosis not present

## 2022-12-14 DIAGNOSIS — E7800 Pure hypercholesterolemia, unspecified: Secondary | ICD-10-CM | POA: Diagnosis not present

## 2022-12-14 MED FILL — Nitroglycerin IV Soln 100 MCG/ML in D5W: INTRA_ARTERIAL | Qty: 10 | Status: AC

## 2022-12-18 NOTE — Progress Notes (Signed)
"  Late Entry" 54mcg Fentanyl wasted with Suzzanne Cloud, RN

## 2022-12-20 ENCOUNTER — Telehealth (HOSPITAL_COMMUNITY): Payer: Self-pay

## 2022-12-20 NOTE — Telephone Encounter (Signed)
Pt insurance is active and benefits verified through [facility]. Co-pay $25.00, DED $0.00/$0.00 met, out of pocket $3,600.00/$0.00 met, co-insurance 0%. No pre-authorization required. Passport, 12/20/2022 @ 2:29PM, [id number]   How many CR sessions are covered? (36 sessions for TCR, 72 sessions for ICR) 72 Is this a lifetime maximum or an annual maximum? lifetime Has the member used any of these services to date? No Is there a time limit (weeks/months) on start of program and/or program completion? No     Will contact patient to see if she is interested in the Cardiac Rehab Program. If interested, patient will need to complete follow up appt. Once completed, patient will be contacted for scheduling upon review by the RN Navigator.

## 2022-12-20 NOTE — Telephone Encounter (Signed)
Called patient to see if she is interested in the Cardiac Rehab Program. Patient expressed interest. Explained scheduling process and went over insurance, patient verbalized understanding. Will contact patient for scheduling once f/u has been completed.  

## 2023-01-02 DIAGNOSIS — E669 Obesity, unspecified: Secondary | ICD-10-CM | POA: Diagnosis not present

## 2023-01-02 DIAGNOSIS — E7800 Pure hypercholesterolemia, unspecified: Secondary | ICD-10-CM | POA: Diagnosis not present

## 2023-01-02 DIAGNOSIS — F3341 Major depressive disorder, recurrent, in partial remission: Secondary | ICD-10-CM | POA: Diagnosis not present

## 2023-01-02 DIAGNOSIS — R748 Abnormal levels of other serum enzymes: Secondary | ICD-10-CM | POA: Diagnosis not present

## 2023-01-02 DIAGNOSIS — I10 Essential (primary) hypertension: Secondary | ICD-10-CM | POA: Diagnosis not present

## 2023-01-02 DIAGNOSIS — I25110 Atherosclerotic heart disease of native coronary artery with unstable angina pectoris: Secondary | ICD-10-CM | POA: Diagnosis not present

## 2023-01-02 DIAGNOSIS — E1169 Type 2 diabetes mellitus with other specified complication: Secondary | ICD-10-CM | POA: Diagnosis not present

## 2023-01-02 DIAGNOSIS — F419 Anxiety disorder, unspecified: Secondary | ICD-10-CM | POA: Diagnosis not present

## 2023-01-02 DIAGNOSIS — M545 Low back pain, unspecified: Secondary | ICD-10-CM | POA: Diagnosis not present

## 2023-01-18 ENCOUNTER — Encounter: Payer: Self-pay | Admitting: Cardiology

## 2023-01-18 ENCOUNTER — Ambulatory Visit: Payer: Medicare HMO | Attending: Cardiology | Admitting: Cardiology

## 2023-01-18 VITALS — BP 130/58 | HR 70 | Ht 62.0 in | Wt 172.8 lb

## 2023-01-18 DIAGNOSIS — E782 Mixed hyperlipidemia: Secondary | ICD-10-CM

## 2023-01-18 DIAGNOSIS — I25118 Atherosclerotic heart disease of native coronary artery with other forms of angina pectoris: Secondary | ICD-10-CM | POA: Diagnosis not present

## 2023-01-18 DIAGNOSIS — E1169 Type 2 diabetes mellitus with other specified complication: Secondary | ICD-10-CM

## 2023-01-18 DIAGNOSIS — I10 Essential (primary) hypertension: Secondary | ICD-10-CM | POA: Diagnosis not present

## 2023-01-18 DIAGNOSIS — E669 Obesity, unspecified: Secondary | ICD-10-CM

## 2023-01-18 NOTE — Progress Notes (Signed)
Cardiology Office Note:    Date:  01/18/2023   ID:  Angel Reynolds, DOB 1956-07-23, MRN [id number]  PCP:  Cher Nakai, MD  Cardiologist:  Jenne Campus, MD    Referring MD: Cher Nakai, MD   Chief Complaint  Patient presents with   Follow-up    History of Present Illness:    Angel Reynolds is a 67 y.o. female with past medical history significant for coronary artery disease few months ago she had cardiac catheterization done which showed 42 to 90% stenosis of proximal LAD, 90% obtuse marginal branch, completely occluded right coronary artery after cardiac catheterization she got some psychological/neurological event eventually it was identified that was probably withdrawal from Vicodin.  She was evaluated for possibility of coronary bypass graft was placed and she was disqualified as candidate for it.  She was brought back to cardiac cath laboratory in February 6 at that time she did have PTCA and stenting with drug-eluting stent to proximal LAD which was successful, however we were unable to cross circumflex.  On top of that the patient became restless towards the end of procedure therefore procedure was aborted.  Additional problem include dyslipidemia, essential hypertension, anemia. She comes today to months for follow-up overall she says she is feeling much better.  She is able to complete her shopping by herself does not need to rest denies having any typical chest pain tightness squeezing pressure burning chest but she said she got a lot of pain everywhere which make the situation kind of difficult to assess.  Past Medical History:  Diagnosis Date   Anxiety    Benign essential hypertension    Chronic constipation    Diabetes mellitus    AODM   Elevated liver enzymes    Hypercholesteremia    Hyperlipidemia    Lower back pain    Obesity    Osteoarthritis of left knee 01/06/2021   Peripheral neuropathy    Recurrent major depressive disorder in partial remission ([place])    Type  2 diabetes mellitus with obesity ([place])     Past Surgical History:  Procedure Laterality Date   ABDOMINAL HYSTERECTOMY     CORONARY ATHERECTOMY N/A 12/11/2022   Procedure: CORONARY ATHERECTOMY;  Surgeon: Jettie Booze, MD;  Location: [facility];  Service: Cardiovascular;  Laterality: N/A;   INTRAVASCULAR PRESSURE WIRE/FFR STUDY N/A 03/19/2022   Procedure: INTRAVASCULAR PRESSURE WIRE/FFR STUDY;  Surgeon: Belva Crome, MD;  Location: [facility];  Service: Cardiovascular;  Laterality: N/A;   LEFT HEART CATH AND CORONARY ANGIOGRAPHY N/A 03/19/2022   Procedure: LEFT HEART CATH AND CORONARY ANGIOGRAPHY;  Surgeon: Belva Crome, MD;  Location: [facility];  Service: Cardiovascular;  Laterality: N/A;   LEFT HEART CATH AND CORONARY ANGIOGRAPHY N/A 12/11/2022   Procedure: LEFT HEART CATH AND CORONARY ANGIOGRAPHY;  Surgeon: Jettie Booze, MD;  Location: [facility];  Service: Cardiovascular;  Laterality: N/A;    Current Medications: Current Meds  Medication Sig   aspirin 81 MG tablet Take 81 mg by mouth every evening.   atorvastatin (LIPITOR) 80 MG tablet Take 80 mg by mouth every evening.   Cholecalciferol 25 MCG (1000 UT) capsule Take 1,000 Units by mouth in the morning.   clopidogrel (PLAVIX) 75 MG tablet Take 1 tablet (75 mg total) by mouth daily.   clotrimazole-betamethasone (LOTRISONE) cream Apply 1 application. topically daily as needed (Eczema).   escitalopram (LEXAPRO) 10 MG tablet Take 10 mg by mouth every  evening.   famotidine (PEPCID) 20 MG tablet Take 20 mg by mouth every 12 (twelve) hours as needed for heartburn or indigestion.   glimepiride (AMARYL) 4 MG tablet Take 4 mg by mouth 2 (two) times daily.   HYDROcodone-acetaminophen (NORCO/VICODIN) 5-325 MG tablet Take 1 tablet by mouth every 8 (eight) hours as needed for pain.   isosorbide mononitrate (IMDUR) 60 MG 24 hr tablet Take 1 tablet (60 mg total) by mouth daily.   lisinopril (ZESTRIL) 10  MG tablet Take 1 tablet by mouth once daily (Patient taking differently: Take 10 mg by mouth daily.)   Menthol, Topical Analgesic, (ICY HOT) 5 % PTCH Place 1 patch onto the skin daily as needed (pain.).   metFORMIN (GLUCOPHAGE) 850 MG tablet Take 850 mg by mouth 3 (three) times daily.   metoprolol tartrate (LOPRESSOR) 25 MG tablet Take 1 tablet (25 mg total) by mouth 2 (two) times daily.   nitroGLYCERIN (NITROSTAT) 0.4 MG SL tablet Place 1 tablet (0.4 mg total) under the tongue every 5 (five) minutes as needed for chest pain.   OZEMPIC, 1 MG/DOSE, 4 MG/3ML SOPN Inject 1 mg into the skin once a week.   traMADol (ULTRAM) 50 MG tablet Take 50 mg by mouth 3 (three) times daily as needed for pain.     Allergies:   Versed [midazolam] and Mobic [meloxicam]   Social History   Socioeconomic History   Marital status: Married    Spouse name: Not on file   Number of children: Not on file   Years of education: Not on file   Highest education level: Not on file  Occupational History   Not on file  Tobacco Use   Smoking status: Former    Types: E-cigarettes, Cigarettes   Smokeless tobacco: Never  Vaping Use   Vaping Use: Former   Substances: Nicotine  Substance and Sexual Activity   Alcohol use: Not Currently   Drug use: Not Currently   Sexual activity: Not on file  Other Topics Concern   Not on file  Social History Narrative   Not on file   Social Determinants of Health   Financial Resource Strain: Not on file  Food Insecurity: No Food Insecurity (12/12/2022)   Hunger Vital Sign    Worried About Running Out of Food in the Last Year: Never true    Ran Out of Food in the Last Year: Never true  Transportation Needs: No Transportation Needs (12/12/2022)   PRAPARE - Hydrologist (Medical): No    Lack of Transportation (Non-Medical): No  Physical Activity: Not on file  Stress: Not on file  Social Connections: Not on file     Family History: The patient's  family history includes Diabetes in her father and maternal uncle. ROS:   Please see the history of present illness.    All 14 point review of systems negative except as described per history of present illness  EKGs/Labs/Other Studies Reviewed:      Recent Labs: 03/19/2022: ALT 25 12/12/2022: BUN 12; Creatinine, Ser 0.91; Hemoglobin 9.8; Platelets 351; Potassium 4.1; Sodium 135  Recent Lipid Panel    Component Value Date/Time   CHOL 133 07/25/2022 1121   TRIG 113 07/25/2022 1121   HDL 41 07/25/2022 1121   CHOLHDL 3.2 07/25/2022 1121   LDLCALC 71 07/25/2022 1121    Physical Exam:    VS:  BP (!) 130/58 (BP Location: Left Arm, Patient Position: Sitting)   Pulse 70   Ht  5\' 2"  (1.575 m)   Wt 172 lb 12.8 oz (78.4 kg)   SpO2 98%   BMI 31.61 kg/m     Wt Readings from Last 3 Encounters:  01/18/23 172 lb 12.8 oz (78.4 kg)  12/11/22 174 lb (78.9 kg)  12/05/22 172 lb 12.8 oz (78.4 kg)     GEN:  Well nourished, well developed in no acute distress HEENT: Normal NECK: No JVD; No carotid bruits LYMPHATICS: No lymphadenopathy CARDIAC: RRR, no murmurs, no rubs, no gallops RESPIRATORY:  Clear to auscultation without rales, wheezing or rhonchi  ABDOMEN: Soft, non-tender, non-distended MUSCULOSKELETAL:  No edema; No deformity  SKIN: Warm and dry LOWER EXTREMITIES: no swelling NEUROLOGIC:  Alert and oriented x 3 PSYCHIATRIC:  Normal affect   ASSESSMENT:    1. Atherosclerosis of native coronary artery of native heart with stable angina pectoris ([place])   2. Benign essential hypertension   3. Type 2 diabetes mellitus with obesity ([place])   4. Mixed hyperlipidemia    PLAN:    In order of problems listed above:  Coronary artery disease status post PTCA and stenting of the proximal LAD with residual completely occluded RCA as well as significant 90% obtuse marginal branches.  Unable to stent she improved clinically.  She is on dual antiplatelet therapy which I will continue.  Will  continue present medications. Benign essential hypertension blood pressure seems to be well-controlled continue present management. Dyslipidemia I did review K PN which show LDL 71 HDL 41 she is on high intense statin follow-up Lipitor 80 which I will continue she is scheduled to see her primary care physician 2 weeks ago will do fasting lipid profile. Anemia.  I will do CBC today.  She did have iron deficiency as well as mild anemia now she looks very pale on the physical exam she is on dual antiplatelet therapy I will check her H&H. Diabetes that being followed by internal medicine team again she does have follow-up appoint with them in 2 weeks   Medication Adjustments/Labs and Tests Ordered: Current medicines are reviewed at length with the patient today.  Concerns regarding medicines are outlined above.  No orders of the defined types were placed in this encounter.  Medication changes: No orders of the defined types were placed in this encounter.   Signed, Park Liter, MD, [facility] 01/18/2023 11:07 AM    [place]

## 2023-01-18 NOTE — Patient Instructions (Addendum)
Medication Instructions:  Your physician recommends that you continue on your current medications as directed. Please refer to the Current Medication list given to you today.  *If you need a refill on your cardiac medications before your next appointment, please call your pharmacy*   Lab Work: You had a CBC today in the office. If you have labs (blood work) drawn today and your tests are completely normal, you will receive your results only by: [place] (if you have MyChart) OR A paper copy in the mail If you have any lab test that is abnormal or we need to change your treatment, we will call you to review the results.   Testing/Procedures: None ordered   Follow-Up: At [facility], you and your health needs are our priority.  As part of our continuing mission to provide you with exceptional heart care, we have created designated Provider Care Teams.  These Care Teams include your primary Cardiologist (physician) and Advanced Practice Providers (APPs -  Physician Assistants and Nurse Practitioners) who all work together to provide you with the care you need, when you need it.  We recommend signing up for the patient portal called "MyChart".  Sign up information is provided on this After Visit Summary.  MyChart is used to connect with patients for Virtual Visits (Telemedicine).  Patients are able to view lab/test results, encounter notes, upcoming appointments, etc.  Non-urgent messages can be sent to your provider as well.   To learn more about what you can do with MyChart, go to [email or web address].    Your next appointment:   3 month(s)  The format for your next appointment:   In Person  Provider:   Jenne Campus, MD    Other Instructions none  Important Information About Sugar

## 2023-01-18 NOTE — Addendum Note (Signed)
Addended by: Truddie Hidden on: 01/18/2023 11:14 AM   Modules accepted: Orders

## 2023-01-19 LAB — CBC
Hematocrit: 34.5 % (ref 34.0–46.6)
Hemoglobin: 10.9 g/dL — ABNORMAL LOW (ref 11.1–15.9)
MCH: 28.3 pg (ref 26.6–33.0)
MCHC: 31.6 g/dL (ref 31.5–35.7)
MCV: 90 fL (ref 79–97)
Platelets: 360 10*3/uL (ref 150–450)
RBC: 3.85 x10E6/uL (ref 3.77–5.28)
RDW: 14.6 % (ref 11.7–15.4)
WBC: 5.5 10*3/uL (ref 3.4–10.8)

## 2023-01-25 ENCOUNTER — Telehealth (HOSPITAL_COMMUNITY): Payer: Self-pay

## 2023-01-25 NOTE — Telephone Encounter (Signed)
Spoke with patient and reviewed Cardiac Rehab Cardiac Risk Profile Nursing Assessment. Patient knows where our office is located.

## 2023-01-25 NOTE — Telephone Encounter (Signed)
Called patient to see if she was interested in participating in the Cardiac Rehab Program. Patient stated yes. Patient will come in for orientation on 01/29/23 @ 10:30AM and will attend the 10:15AM exercise class.   Tourist information centre manager.

## 2023-01-26 ENCOUNTER — Other Ambulatory Visit: Payer: Self-pay | Admitting: Cardiology

## 2023-01-29 ENCOUNTER — Encounter (HOSPITAL_COMMUNITY)
Admission: RE | Admit: 2023-01-29 | Discharge: 2023-01-29 | Disposition: A | Discharge status: Home / Self Care | Payer: Medicare HMO | Source: Ambulatory Visit | Attending: Cardiology | Admitting: Cardiology

## 2023-01-29 ENCOUNTER — Encounter (HOSPITAL_COMMUNITY): Payer: Self-pay

## 2023-01-29 VITALS — BP 128/58 | HR 70 | Ht 63.25 in | Wt 173.3 lb

## 2023-01-29 DIAGNOSIS — Z955 Presence of coronary angioplasty implant and graft: Secondary | ICD-10-CM | POA: Diagnosis not present

## 2023-01-29 HISTORY — DX: Atherosclerotic heart disease of native coronary artery without angina pectoris: I25.10

## 2023-01-29 NOTE — Progress Notes (Signed)
Cardiac Individual Treatment Plan  Patient Details  Name: Angel Reynolds MRN: [id number] Date of Birth: Jun 19, 1956 Referring Provider:   Flowsheet Row INTENSIVE CARDIAC REHAB ORIENT from 01/29/2023 in [facility] [place]  Referring Provider Park Liter, MD       Initial Encounter Date:  [place] from 01/29/2023 in [facility]  Date 01/29/23       Visit Diagnosis: 12/11/22 S/P DES Mid LAD  Patient's Home Medications on Admission:  Current Outpatient Medications:    aspirin 81 MG tablet, Take 81 mg by mouth every evening., Disp: , Rfl:    atorvastatin (LIPITOR) 80 MG tablet, Take 80 mg by mouth every evening., Disp: , Rfl:    Cholecalciferol 25 MCG (1000 UT) capsule, Take 1,000 Units by mouth in the morning., Disp: , Rfl:    clopidogrel (PLAVIX) 75 MG tablet, Take 1 tablet (75 mg total) by mouth daily., Disp: 30 tablet, Rfl: 6   clotrimazole-betamethasone (LOTRISONE) cream, Apply 1 application. topically daily as needed (Eczema)., Disp: , Rfl:    escitalopram (LEXAPRO) 10 MG tablet, Take 10 mg by mouth every evening., Disp: , Rfl:    famotidine (PEPCID) 20 MG tablet, Take 20 mg by mouth every 12 (twelve) hours as needed for heartburn or indigestion., Disp: , Rfl:    glimepiride (AMARYL) 4 MG tablet, Take 4 mg by mouth 2 (two) times daily., Disp: , Rfl:    HYDROcodone-acetaminophen (NORCO/VICODIN) 5-325 MG tablet, Take 1 tablet by mouth every 8 (eight) hours as needed for pain., Disp: , Rfl:    isosorbide mononitrate (IMDUR) 60 MG 24 hr tablet, Take 1 tablet (60 mg total) by mouth daily., Disp: 90 tablet, Rfl: 3   lisinopril (ZESTRIL) 10 MG tablet, Take 1 tablet by mouth once daily (Patient taking differently: Take 10 mg by mouth daily.), Disp: 90 tablet, Rfl: 2   Menthol, Topical Analgesic, (ICY HOT) 5 % PTCH, Place 1 patch onto the  skin daily as needed (pain.)., Disp: , Rfl:    metFORMIN (GLUCOPHAGE) 850 MG tablet, Take 850 mg by mouth 3 (three) times daily., Disp: , Rfl:    metoprolol tartrate (LOPRESSOR) 25 MG tablet, Take 1 tablet by mouth twice daily, Disp: 180 tablet, Rfl: 3   nitroGLYCERIN (NITROSTAT) 0.4 MG SL tablet, Place 1 tablet (0.4 mg total) under the tongue every 5 (five) minutes as needed for chest pain., Disp: 35 tablet, Rfl: 2   traMADol (ULTRAM) 50 MG tablet, Take 50 mg by mouth 3 (three) times daily as needed for pain., Disp: , Rfl:    OZEMPIC, 1 MG/DOSE, 4 MG/3ML SOPN, Inject 1 mg into the skin once a week. (Patient not taking: Reported on 01/29/2023), Disp: , Rfl:   Past Medical History: Past Medical History:  Diagnosis Date   Anxiety    Benign essential hypertension    Chronic constipation    Coronary artery disease    Diabetes mellitus    AODM   Elevated liver enzymes    Hypercholesteremia    Hyperlipidemia    Lower back pain    Obesity    Osteoarthritis of left knee 01/06/2021   Peripheral neuropathy    Recurrent major depressive disorder in partial remission ([place])    Type 2 diabetes mellitus with obesity (HCC)     Tobacco Use: Social History   Tobacco Use  Smoking Status Former   Types:  E-cigarettes, Cigarettes  Smokeless Tobacco Never  Tobacco Comments   Stopped vaping in 2023    Labs: Review Flowsheet       Latest Ref Rng & Units 06/03/2008 03/19/2022 07/25/2022  Labs for ITP Cardiac and Pulmonary Rehab  Cholestrol 100 - 199 mg/dL - - 133   LDL (calc) 0 - 99 mg/dL - - 71   HDL-C >39 mg/dL - - 41   Trlycerides 0 - 149 mg/dL - - 113   Hemoglobin A1c 4.8 - 5.6 % - 8.2  -  TCO2 - 29  - -    Capillary Blood Glucose: Lab Results  Component Value Date   GLUCAP 222 (H) 12/12/2022   GLUCAP 150 (H) 12/12/2022   GLUCAP 124 (H) 12/11/2022   GLUCAP 130 (H) 12/11/2022   GLUCAP 361 (H) 12/11/2022     Exercise Target Goals: Exercise Program Goal: Individual exercise  prescription set using results from initial 6 min walk test and THRR while considering  patient's activity barriers and safety.   Exercise Prescription Goal: Initial exercise prescription builds to 30-45 minutes a day of aerobic activity, 2-3 days per week.  Home exercise guidelines will be given to patient during program as part of exercise prescription that the participant will acknowledge.  Activity Barriers & Risk Stratification:  Activity Barriers & Cardiac Risk Stratification - 01/29/23 1135       Activity Barriers & Cardiac Risk Stratification   Activity Barriers Arthritis;Back Problems;Joint Problems;Balance Concerns;Assistive Device;Other (comment)    Comments Chronic low back pain, DJD;  osteoarthritis in both knees, bone-on-bone.    Cardiac Risk Stratification Moderate             6 Minute Walk:  6 Minute Walk     Row Name 01/29/23 1237         6 Minute Walk   Phase Initial     Distance 472 feet     Walk Time 4.85 minutes     # of Rest Breaks 1  Seated for 1.15 minutes     MPH 1.1     METS 1.52     RPE 12     Perceived Dyspnea  0     VO2 Peak 5.31     Symptoms Yes (comment)     Comments Low back, bilateral knee pain-chronic, 6/10 on pain scale.     Resting HR 70 bpm     Resting BP 128/58     Resting Oxygen Saturation  98 %     Exercise Oxygen Saturation  during 6 min walk 97 %     Max Ex. HR 93 bpm     Max Ex. BP 166/62     2 Minute Post BP 122/58              Oxygen Initial Assessment:   Oxygen Re-Evaluation:   Oxygen Discharge (Final Oxygen Re-Evaluation):   Initial Exercise Prescription:  Initial Exercise Prescription - 01/29/23 1200       Date of Initial Exercise RX and Referring Provider   Date 01/29/23    Referring Provider Park Liter, MD    Expected Discharge Date 04/12/23      NuStep   Level 1    SPM 70    Minutes 20    METs 1.5      Prescription Details   Frequency (times per week) 3    Duration Progress to 30  minutes of continuous aerobic without signs/symptoms of physical distress  Intensity   THRR 40-80% of Max Heartrate 61-122    Ratings of Perceived Exertion 11-13    Perceived Dyspnea 0-4      Progression   Progression Continue to progress workloads to maintain intensity without signs/symptoms of physical distress.      Resistance Training   Training Prescription Yes    Weight 2 lbs    Reps 10-15             Perform Capillary Blood Glucose checks as needed.  Exercise Prescription Changes:   Exercise Comments:   Exercise Goals and Review:   Exercise Goals     Row Name 01/29/23 1135             Exercise Goals   Increase Physical Activity Yes       Intervention Provide advice, education, support and counseling about physical activity/exercise needs.;Develop an individualized exercise prescription for aerobic and resistive training based on initial evaluation findings, risk stratification, comorbidities and participant's personal goals.       Expected Outcomes Short Term: Attend rehab on a regular basis to increase amount of physical activity.;Long Term: Add in home exercise to make exercise part of routine and to increase amount of physical activity.;Long Term: Exercising regularly at least 3-5 days a week.       Increase Strength and Stamina Yes       Intervention Provide advice, education, support and counseling about physical activity/exercise needs.;Develop an individualized exercise prescription for aerobic and resistive training based on initial evaluation findings, risk stratification, comorbidities and participant's personal goals.       Expected Outcomes Short Term: Increase workloads from initial exercise prescription for resistance, speed, and METs.;Short Term: Perform resistance training exercises routinely during rehab and add in resistance training at home;Long Term: Improve cardiorespiratory fitness, muscular endurance and strength as measured by increased  METs and functional capacity (6MWT)       Able to understand and use rate of perceived exertion (RPE) scale Yes       Intervention Provide education and explanation on how to use RPE scale       Expected Outcomes Short Term: Able to use RPE daily in rehab to express subjective intensity level;Long Term:  Able to use RPE to guide intensity level when exercising independently       Knowledge and understanding of Target Heart Rate Range (THRR) Yes       Intervention Provide education and explanation of THRR including how the numbers were predicted and where they are located for reference       Expected Outcomes Short Term: Able to state/look up THRR;Long Term: Able to use THRR to govern intensity when exercising independently;Short Term: Able to use daily as guideline for intensity in rehab       Able to check pulse independently Yes       Intervention Provide education and demonstration on how to check pulse in carotid and radial arteries.;Review the importance of being able to check your own pulse for safety during independent exercise       Expected Outcomes Short Term: Able to explain why pulse checking is important during independent exercise;Long Term: Able to check pulse independently and accurately       Understanding of Exercise Prescription Yes       Intervention Provide education, explanation, and written materials on patient's individual exercise prescription       Expected Outcomes Short Term: Able to explain program exercise prescription;Long Term: Able to explain home exercise prescription  to exercise independently                Exercise Goals Re-Evaluation :   Discharge Exercise Prescription (Final Exercise Prescription Changes):   Nutrition:  Target Goals: Understanding of nutrition guidelines, daily intake of sodium 1500mg , cholesterol 200mg , calories 30% from fat and 7% or less from saturated fats, daily to have 5 or more servings of fruits and  vegetables.  Biometrics:  Pre Biometrics - 01/29/23 1157       Pre Biometrics   Waist Circumference 44.5 inches    Hip Circumference 46.25 inches    Waist to Hip Ratio 0.96 %    Triceps Skinfold 22 mm    % Body Fat 42.8 %    Grip Strength 16 kg    Flexibility --   Not performed, chronic back pain.   Single Leg Stand 0.87 seconds              Nutrition Therapy Plan and Nutrition Goals:   Nutrition Assessments:  MEDIFICTS Score Key: ?70 Need to make dietary changes  40-70 Heart Healthy Diet ? 40 Therapeutic Level Cholesterol Diet    Picture Your Plate Scores: [id number] Unhealthy dietary pattern with much room for improvement. 41-50 Dietary pattern unlikely to meet recommendations for good health and room for improvement. 51-60 More healthful dietary pattern, with some room for improvement.  >60 Healthy dietary pattern, although there may be some specific behaviors that could be improved.    Nutrition Goals Re-Evaluation:   Nutrition Goals Re-Evaluation:   Nutrition Goals Discharge (Final Nutrition Goals Re-Evaluation):   Psychosocial: Target Goals: Acknowledge presence or absence of significant depression and/or stress, maximize coping skills, provide positive support system. Participant is able to verbalize types and ability to use techniques and skills needed for reducing stress and depression.  Initial Review & Psychosocial Screening:  Initial Psych Review & Screening - 01/29/23 1202       Initial Review   Current issues with Current Stress Concerns;History of Depression;Current Depression    Source of Stress Concerns Chronic Illness;Family   Has chronic pain lower back and bilateral knee pain   Comments Legaci's daughter needs a heart and lung transplant. Rosamond has chronic lower back and knee pain      Family Dynamics   Good Support System? Yes   Shaily has her husband, daughter and 2 sisiters for support     Barriers   Psychosocial barriers to participate in  program There are no identifiable barriers or psychosocial needs.      Screening Interventions   Interventions Encouraged to exercise    Expected Outcomes Short Term goal: Utilizing psychosocial counselor, staff and physician to assist with identification of specific Stressors or current issues interfering with healing process. Setting desired goal for each stressor or current issue identified.;Long Term Goal: Stressors or current issues are controlled or eliminated.;Short Term goal: Identification and review with participant of any Quality of Life or Depression concerns found by scoring the questionnaire.             Quality of Life Scores:  Quality of Life - 01/29/23 1241       Quality of Life   Select Quality of Life      Quality of Life Scores   Health/Function Pre 19.1 %    Socioeconomic Pre 18.33 %    Psych/Spiritual Pre 16.79 %    Family Pre 22.8 %    GLOBAL Pre 19.03 %  Scores of 19 and below usually indicate a poorer quality of life in these areas.  A difference of  2-3 points is a clinically meaningful difference.  A difference of 2-3 points in the total score of the Quality of Life Index has been associated with significant improvement in overall quality of life, self-image, physical symptoms, and general health in studies assessing change in quality of life.  PHQ-9: Review Flowsheet       01/29/2023  Depression screen PHQ 2/9  Decreased Interest 1  Down, Depressed, Hopeless 0  PHQ - 2 Score 1  Altered sleeping 2  Tired, decreased energy 3  Change in appetite 2  Feeling bad or failure about yourself  0  Trouble concentrating 1  Moving slowly or fidgety/restless 0  Suicidal thoughts 0  PHQ-9 Score 9  Difficult doing work/chores Somewhat difficult   Interpretation of Total Score  Total Score Depression Severity:  1-4 = Minimal depression, 5-9 = Mild depression, 10-14 = Moderate depression, 15-19 = Moderately severe depression, 20-27 = Severe  depression   Psychosocial Evaluation and Intervention:   Psychosocial Re-Evaluation:   Psychosocial Discharge (Final Psychosocial Re-Evaluation):   Vocational Rehabilitation: Provide vocational rehab assistance to qualifying candidates.   Vocational Rehab Evaluation & Intervention:  Vocational Rehab - 01/29/23 1229       Initial Vocational Rehab Evaluation & Intervention   Assessment shows need for Vocational Rehabilitation No   Aramis is retired and does not need vocational rehab at this time            Education: Education Goals: Education classes will be provided on a weekly basis, covering required topics. Participant will state understanding/return demonstration of topics presented.     Core Videos: Exercise    Move It!  Clinical staff conducted group or individual video education with verbal and written material and guidebook.  Patient learns the recommended Pritikin exercise program. Exercise with the goal of living a long, healthy life. Some of the health benefits of exercise include controlled diabetes, healthier blood pressure levels, improved cholesterol levels, improved heart and lung capacity, improved sleep, and better body composition. Everyone should speak with their doctor before starting or changing an exercise routine.  Biomechanical Limitations Clinical staff conducted group or individual video education with verbal and written material and guidebook.  Patient learns how biomechanical limitations can impact exercise and how we can mitigate and possibly overcome limitations to have an impactful and balanced exercise routine.  Body Composition Clinical staff conducted group or individual video education with verbal and written material and guidebook.  Patient learns that body composition (ratio of muscle mass to fat mass) is a key component to assessing overall fitness, rather than body weight alone. Increased fat mass, especially visceral belly fat, can  put [place] at increased risk for metabolic syndrome, type 2 diabetes, heart disease, and even death. It is recommended to combine diet and exercise (cardiovascular and resistance training) to improve your body composition. Seek guidance from your physician and exercise physiologist before implementing an exercise routine.  Exercise Action Plan Clinical staff conducted group or individual video education with verbal and written material and guidebook.  Patient learns the recommended strategies to achieve and enjoy long-term exercise adherence, including variety, self-motivation, self-efficacy, and positive decision making. Benefits of exercise include fitness, good health, weight management, more energy, better sleep, less stress, and overall well-being.  Medical   Heart Disease Risk Reduction Clinical staff conducted group or individual video education with verbal and written material and  guidebook.  Patient learns our heart is our most vital organ as it circulates oxygen, nutrients, white blood cells, and hormones throughout the entire body, and carries waste away. Data supports a plant-based eating plan like the Pritikin Program for its effectiveness in slowing progression of and reversing heart disease. The video provides a number of recommendations to address heart disease.   Metabolic Syndrome and Belly Fat  Clinical staff conducted group or individual video education with verbal and written material and guidebook.  Patient learns what metabolic syndrome is, how it leads to heart disease, and how one can reverse it and keep it from coming back. You have metabolic syndrome if you have 3 of the following 5 criteria: abdominal obesity, high blood pressure, high triglycerides, low HDL cholesterol, and high blood sugar.  Hypertension and Heart Disease Clinical staff conducted group or individual video education with verbal and written material and guidebook.  Patient learns that high blood pressure, or  hypertension, is very common in the [place]. Hypertension is largely due to excessive salt intake, but other important risk factors include being overweight, physical inactivity, drinking too much alcohol, smoking, and not eating enough potassium from fruits and vegetables. High blood pressure is a leading risk factor for heart attack, stroke, congestive heart failure, dementia, kidney failure, and premature death. Long-term effects of excessive salt intake include stiffening of the arteries and thickening of heart muscle and organ damage. Recommendations include ways to reduce hypertension and the risk of heart disease.  Diseases of Our Time - Focusing on Diabetes Clinical staff conducted group or individual video education with verbal and written material and guidebook.  Patient learns why the best way to stop diseases of our time is prevention, through food and other lifestyle changes. Medicine (such as prescription pills and surgeries) is often only a Band-Aid on the problem, not a long-term solution. Most common diseases of our time include obesity, type 2 diabetes, hypertension, heart disease, and cancer. The Pritikin Program is recommended and has been proven to help reduce, reverse, and/or prevent the damaging effects of metabolic syndrome.  Nutrition   Overview of the Pritikin Eating Plan  Clinical staff conducted group or individual video education with verbal and written material and guidebook.  Patient learns about the Sweetser for disease risk reduction. The Bruni emphasizes a wide variety of unrefined, minimally-processed carbohydrates, like fruits, vegetables, whole grains, and legumes. Go, Caution, and Stop food choices are explained. Plant-based and lean animal proteins are emphasized. Rationale provided for low sodium intake for blood pressure control, low added sugars for blood sugar stabilization, and low added fats and oils for coronary artery disease  risk reduction and weight management.  Calorie Density  Clinical staff conducted group or individual video education with verbal and written material and guidebook.  Patient learns about calorie density and how it impacts the Pritikin Eating Plan. Knowing the characteristics of the food you choose will help you decide whether those foods will lead to weight gain or weight loss, and whether you want to consume more or less of them. Weight loss is usually a side effect of the Pritikin Eating Plan because of its focus on low calorie-dense foods.  Label Reading  Clinical staff conducted group or individual video education with verbal and written material and guidebook.  Patient learns about the Pritikin recommended label reading guidelines and corresponding recommendations regarding calorie density, added sugars, sodium content, and whole grains.  Dining Out - Part 1  Clinical  staff conducted group or individual video education with verbal and written material and guidebook.  Patient learns that restaurant meals can be sabotaging because they can be so high in calories, fat, sodium, and/or sugar. Patient learns recommended strategies on how to positively address this and avoid unhealthy pitfalls.  Facts on Fats  Clinical staff conducted group or individual video education with verbal and written material and guidebook.  Patient learns that lifestyle modifications can be just as effective, if not more so, as many medications for lowering your risk of heart disease. A Pritikin lifestyle can help to reduce your risk of inflammation and atherosclerosis (cholesterol build-up, or plaque, in the artery walls). Lifestyle interventions such as dietary choices and physical activity address the cause of atherosclerosis. A review of the types of fats and their impact on blood cholesterol levels, along with dietary recommendations to reduce fat intake is also included.  Nutrition Action Plan  Clinical staff  conducted group or individual video education with verbal and written material and guidebook.  Patient learns how to incorporate Pritikin recommendations into their lifestyle. Recommendations include planning and keeping personal health goals in mind as an important part of their success.  Healthy Mind-Set    Healthy Minds, Bodies, Hearts  Clinical staff conducted group or individual video education with verbal and written material and guidebook.  Patient learns how to identify when they are stressed. Video will discuss the impact of that stress, as well as the many benefits of stress management. Patient will also be introduced to stress management techniques. The way we think, act, and feel has an impact on our hearts.  How Our Thoughts Can Heal Our Hearts  Clinical staff conducted group or individual video education with verbal and written material and guidebook.  Patient learns that negative thoughts can cause depression and anxiety. This can result in negative lifestyle behavior and serious health problems. Cognitive behavioral therapy is an effective method to help control our thoughts in order to change and improve our emotional outlook.  Additional Videos:  Exercise    Improving Performance  Clinical staff conducted group or individual video education with verbal and written material and guidebook.  Patient learns to use a non-linear approach by alternating intensity levels and lengths of time spent exercising to help burn more calories and lose more body fat. Cardiovascular exercise helps improve heart health, metabolism, hormonal balance, blood sugar control, and recovery from fatigue. Resistance training improves strength, endurance, balance, coordination, reaction time, metabolism, and muscle mass. Flexibility exercise improves circulation, posture, and balance. Seek guidance from your physician and exercise physiologist before implementing an exercise routine and learn your capabilities  and proper form for all exercise.  Introduction to Yoga  Clinical staff conducted group or individual video education with verbal and written material and guidebook.  Patient learns about yoga, a discipline of the coming together of mind, breath, and body. The benefits of yoga include improved flexibility, improved range of motion, better posture and core strength, increased lung function, weight loss, and positive self-image. Yoga's heart health benefits include lowered blood pressure, healthier heart rate, decreased cholesterol and triglyceride levels, improved immune function, and reduced stress. Seek guidance from your physician and exercise physiologist before implementing an exercise routine and learn your capabilities and proper form for all exercise.  Medical   Aging: Enhancing Your Quality of Life  Clinical staff conducted group or individual video education with verbal and written material and guidebook.  Patient learns key strategies and recommendations to stay in  good physical health and enhance quality of life, such as prevention strategies, having an advocate, securing a [place], and keeping a list of medications and system for tracking them. It also discusses how to avoid risk for bone loss.  Biology of Weight Control  Clinical staff conducted group or individual video education with verbal and written material and guidebook.  Patient learns that weight gain occurs because we consume more calories than we burn (eating more, moving less). Even if your body weight is normal, you may have higher ratios of fat compared to muscle mass. Too much body fat puts you at increased risk for cardiovascular disease, heart attack, stroke, type 2 diabetes, and obesity-related cancers. In addition to exercise, following the [place] can help reduce your risk.  Decoding Lab Results  Clinical staff conducted group or individual video education with verbal and  written material and guidebook.  Patient learns that lab test reflects one measurement whose values change over time and are influenced by many factors, including medication, stress, sleep, exercise, food, hydration, pre-existing medical conditions, and more. It is recommended to use the knowledge from this video to become more involved with your lab results and evaluate your numbers to speak with your doctor.   Diseases of Our Time - Overview  Clinical staff conducted group or individual video education with verbal and written material and guidebook.  Patient learns that according to the CDC, 50% to 70% of chronic diseases (such as obesity, type 2 diabetes, elevated lipids, hypertension, and heart disease) are avoidable through lifestyle improvements including healthier food choices, listening to satiety cues, and increased physical activity.  Sleep Disorders Clinical staff conducted group or individual video education with verbal and written material and guidebook.  Patient learns how good quality and duration of sleep are important to overall health and well-being. Patient also learns about sleep disorders and how they impact health along with recommendations to address them, including discussing with a physician.  Nutrition  Dining Out - Part 2 Clinical staff conducted group or individual video education with verbal and written material and guidebook.  Patient learns how to plan ahead and communicate in order to maximize their dining experience in a healthy and nutritious manner. Included are recommended food choices based on the type of restaurant the patient is visiting.   Fueling a Best boy conducted group or individual video education with verbal and written material and guidebook.  There is a strong connection between our food choices and our health. Diseases like obesity and type 2 diabetes are very prevalent and are in large-part due to lifestyle choices. The  Pritikin Eating Plan provides plenty of food and hunger-curbing satisfaction. It is easy to follow, affordable, and helps reduce health risks.  Menu Workshop  Clinical staff conducted group or individual video education with verbal and written material and guidebook.  Patient learns that restaurant meals can sabotage health goals because they are often packed with calories, fat, sodium, and sugar. Recommendations include strategies to plan ahead and to communicate with the manager, chef, or server to help order a healthier meal.  Planning Your Eating Strategy  Clinical staff conducted group or individual video education with verbal and written material and guidebook.  Patient learns about the [place] and its benefit of reducing the risk of disease. The [place] does not focus on calories. Instead, it emphasizes high-quality, nutrient-rich foods. By knowing the characteristics of the foods, we  choose, we can determine their calorie density and make informed decisions.  Targeting Your Nutrition Priorities  Clinical staff conducted group or individual video education with verbal and written material and guidebook.  Patient learns that lifestyle habits have a tremendous impact on disease risk and progression. This video provides eating and physical activity recommendations based on your personal health goals, such as reducing LDL cholesterol, losing weight, preventing or controlling type 2 diabetes, and reducing high blood pressure.  Vitamins and Minerals  Clinical staff conducted group or individual video education with verbal and written material and guidebook.  Patient learns different ways to obtain key vitamins and minerals, including through a recommended healthy diet. It is important to discuss all supplements you take with your doctor.   Healthy Mind-Set    Smoking Cessation  Clinical staff conducted group or individual video education with verbal and written  material and guidebook.  Patient learns that cigarette smoking and tobacco addiction pose a serious health risk which affects millions of people. Stopping smoking will significantly reduce the risk of heart disease, lung disease, and many forms of cancer. Recommended strategies for quitting are covered, including working with your doctor to develop a successful plan.  Culinary   Becoming a Financial trader conducted group or individual video education with verbal and written material and guidebook.  Patient learns that cooking at home can be healthy, cost-effective, quick, and puts them in control. Keys to cooking healthy recipes will include looking at your recipe, assessing your equipment needs, planning ahead, making it simple, choosing cost-effective seasonal ingredients, and limiting the use of added fats, salts, and sugars.  Cooking - Breakfast and Snacks  Clinical staff conducted group or individual video education with verbal and written material and guidebook.  Patient learns how important breakfast is to satiety and nutrition through the entire day. Recommendations include key foods to eat during breakfast to help stabilize blood sugar levels and to prevent overeating at meals later in the day. Planning ahead is also a key component.  Cooking - Human resources officer conducted group or individual video education with verbal and written material and guidebook.  Patient learns eating strategies to improve overall health, including an approach to cook more at home. Recommendations include thinking of animal protein as a side on your plate rather than center stage and focusing instead on lower calorie dense options like vegetables, fruits, whole grains, and plant-based proteins, such as beans. Making sauces in large quantities to freeze for later and leaving the skin on your vegetables are also recommended to maximize your experience.  Cooking - Healthy Salads and  Dressing Clinical staff conducted group or individual video education with verbal and written material and guidebook.  Patient learns that vegetables, fruits, whole grains, and legumes are the foundations of the [place]. Recommendations include how to incorporate each of these in flavorful and healthy salads, and how to create homemade salad dressings. Proper handling of ingredients is also covered. Cooking - Soups and [employer] - Soups and Desserts Clinical staff conducted group or individual video education with verbal and written material and guidebook.  Patient learns that Pritikin soups and desserts make for easy, nutritious, and delicious snacks and meal components that are low in sodium, fat, sugar, and calorie density, while high in vitamins, minerals, and filling fiber. Recommendations include simple and healthy ideas for soups and desserts.   Overview     The Pritikin Solution Program Overview Clinical staff  conducted group or individual video education with verbal and written material and guidebook.  Patient learns that the results of the [place] Program have been documented in more than 100 articles published in peer-reviewed journals, and the benefits include reducing risk factors for (and, in some cases, even reversing) high cholesterol, high blood pressure, type 2 diabetes, obesity, and more! An overview of the three key pillars of the Pritikin Program will be covered: eating well, doing regular exercise, and having a healthy mind-set.  WORKSHOPS  Exercise: Exercise Basics: Building Your Action Plan Clinical staff led group instruction and group discussion with PowerPoint presentation and patient guidebook. To enhance the learning environment the use of posters, models and videos may be added. At the conclusion of this workshop, patients will comprehend the difference between physical activity and exercise, as well as the benefits of incorporating both, into  their routine. Patients will understand the FITT (Frequency, Intensity, Time, and Type) principle and how to use it to build an exercise action plan. In addition, safety concerns and other considerations for exercise and cardiac rehab will be addressed by the presenter. The purpose of this lesson is to promote a comprehensive and effective weekly exercise routine in order to improve patients' overall level of fitness.   Managing Heart Disease: Your Path to a Healthier Heart Clinical staff led group instruction and group discussion with PowerPoint presentation and patient guidebook. To enhance the learning environment the use of posters, models and videos may be added.At the conclusion of this workshop, patients will understand the anatomy and physiology of the heart. Additionally, they will understand how Pritikin's three pillars impact the risk factors, the progression, and the management of heart disease.  The purpose of this lesson is to provide a high-level overview of the heart, heart disease, and how the Pritikin lifestyle positively impacts risk factors.  Exercise Biomechanics Clinical staff led group instruction and group discussion with PowerPoint presentation and patient guidebook. To enhance the learning environment the use of posters, models and videos may be added. Patients will learn how the structural parts of their bodies function and how these functions impact their daily activities, movement, and exercise. Patients will learn how to promote a neutral spine, learn how to manage pain, and identify ways to improve their physical movement in order to promote healthy living. The purpose of this lesson is to expose patients to common physical limitations that impact physical activity. Participants will learn practical ways to adapt and manage aches and pains, and to minimize their effect on regular exercise. Patients will learn how to maintain good posture while sitting, walking, and  lifting.  Balance Training and Fall Prevention  Clinical staff led group instruction and group discussion with PowerPoint presentation and patient guidebook. To enhance the learning environment the use of posters, models and videos may be added. At the conclusion of this workshop, patients will understand the importance of their sensorimotor skills (vision, proprioception, and the vestibular system) in maintaining their ability to balance as they age. Patients will apply a variety of balancing exercises that are appropriate for their current level of function. Patients will understand the common causes for poor balance, possible solutions to these problems, and ways to modify their physical environment in order to minimize their fall risk. The purpose of this lesson is to teach patients about the importance of maintaining balance as they age and ways to minimize their risk of falling.  WORKSHOPS   Nutrition:  Fueling a Scientist, research (physical sciences) led group  instruction and group discussion with PowerPoint presentation and patient guidebook. To enhance the learning environment the use of posters, models and videos may be added. Patients will review the foundational principles of the [place] and understand what constitutes a serving size in each of the food groups. Patients will also learn Pritikin-friendly foods that are better choices when away from home and review make-ahead meal and snack options. Calorie density will be reviewed and applied to three nutrition priorities: weight maintenance, weight loss, and weight gain. The purpose of this lesson is to reinforce (in a group setting) the key concepts around what patients are recommended to eat and how to apply these guidelines when away from home by planning and selecting Pritikin-friendly options. Patients will understand how calorie density may be adjusted for different weight management goals.  Mindful Eating  Clinical staff led  group instruction and group discussion with PowerPoint presentation and patient guidebook. To enhance the learning environment the use of posters, models and videos may be added. Patients will briefly review the concepts of the [place] and the importance of low-calorie dense foods. The concept of mindful eating will be introduced as well as the importance of paying attention to internal hunger signals. Triggers for non-hunger eating and techniques for dealing with triggers will be explored. The purpose of this lesson is to provide patients with the opportunity to review the basic principles of the [place], discuss the value of eating mindfully and how to measure internal cues of hunger and fullness using the Hunger Scale. Patients will also discuss reasons for non-hunger eating and learn strategies to use for controlling emotional eating.  Targeting Your Nutrition Priorities Clinical staff led group instruction and group discussion with PowerPoint presentation and patient guidebook. To enhance the learning environment the use of posters, models and videos may be added. Patients will learn how to determine their genetic susceptibility to disease by reviewing their family history. Patients will gain insight into the importance of diet as part of an overall healthy lifestyle in mitigating the impact of genetics and other environmental insults. The purpose of this lesson is to provide patients with the opportunity to assess their personal nutrition priorities by looking at their family history, their own health history and current risk factors. Patients will also be able to discuss ways of prioritizing and modifying the [place] for their highest risk areas  Menu  Clinical staff led group instruction and group discussion with PowerPoint presentation and patient guidebook. To enhance the learning environment the use of posters, models and videos may be added. Using menus  brought in from [employer], or printed from [employer], patients will apply the [place] dining out guidelines that were presented in the [employer] video. Patients will also be able to practice these guidelines in a variety of provided scenarios. The purpose of this lesson is to provide patients with the opportunity to practice hands-on learning of the [place] with actual menus and practice scenarios.  Label Reading Clinical staff led group instruction and group discussion with PowerPoint presentation and patient guidebook. To enhance the learning environment the use of posters, models and videos may be added. Patients will review and discuss the Pritikin label reading guidelines presented in Pritikin's Label Reading Educational series video. Using fool labels brought in from local grocery stores and markets, patients will apply the label reading guidelines and determine if the packaged food meet the Pritikin guidelines. The purpose of  this lesson is to provide patients with the opportunity to review, discuss, and practice hands-on learning of the Pritikin Label Reading guidelines with actual packaged food labels. [place] Workshops are designed to teach patients ways to prepare quick, simple, and affordable recipes at home. The importance of nutrition's role in chronic disease risk reduction is reflected in its emphasis in the overall Pritikin program. By learning how to prepare essential core Pritikin Eating Plan recipes, patients will increase control over what they eat; be able to customize the flavor of foods without the use of added salt, sugar, or fat; and improve the quality of the food they consume. By learning a set of core recipes which are easily assembled, quickly prepared, and affordable, patients are more likely to prepare more healthy foods at home. These workshops focus on convenient breakfasts, simple  entres, side dishes, and desserts which can be prepared with minimal effort and are consistent with nutrition recommendations for cardiovascular risk reduction. Cooking [employer] are taught by a Engineer, materials (RD) who has been trained by the [employer]. The chef or RD has a clear understanding of the importance of minimizing - if not completely eliminating - added fat, sugar, and sodium in recipes. Throughout the series of [place] Workshop sessions, patients will learn about healthy ingredients and efficient methods of cooking to build confidence in their capability to prepare    Cooking School weekly topics:  Adding Flavor- Sodium-Free  Fast and Healthy Breakfasts  Powerhouse Plant-Based Proteins  Satisfying Salads and Dressings  Simple Sides and Sauces  International Cuisine-Spotlight on the [place] Zones  Delicious Desserts  Savory Soups  [employer] - Meals in a Agricultural consultant Appetizers and Snacks  Comforting Weekend Breakfasts  One-Pot Wonders   Fast Evening Meals  Contractor Your Pritikin Plate  WORKSHOPS   Healthy Mindset (Psychosocial):  Focused Goals, Sustainable Changes Clinical staff led group instruction and group discussion with PowerPoint presentation and patient guidebook. To enhance the learning environment the use of posters, models and videos may be added. Patients will be able to apply effective goal setting strategies to establish at least one personal goal, and then take consistent, meaningful action toward that goal. They will learn to identify common barriers to achieving personal goals and develop strategies to overcome them. Patients will also gain an understanding of how our mind-set can impact our ability to achieve goals and the importance of cultivating a positive and growth-oriented mind-set. The purpose of this lesson is to provide patients with a deeper understanding of how to set and  achieve personal goals, as well as the tools and strategies needed to overcome common obstacles which may arise along the way.  From Head to Heart: The Power of a Healthy Outlook  Clinical staff led group instruction and group discussion with PowerPoint presentation and patient guidebook. To enhance the learning environment the use of posters, models and videos may be added. Patients will be able to recognize and describe the impact of emotions and mood on physical health. They will discover the importance of self-care and explore self-care practices which may work for them. Patients will also learn how to utilize the 4 C's to cultivate a healthier outlook and better manage stress and challenges. The purpose of this lesson is to demonstrate to patients how a healthy outlook is an essential part of maintaining good health, especially as they continue their cardiac rehab journey.  Healthy Sleep  for a Healthy Heart Clinical staff led group instruction and group discussion with PowerPoint presentation and patient guidebook. To enhance the learning environment the use of posters, models and videos may be added. At the conclusion of this workshop, patients will be able to demonstrate knowledge of the importance of sleep to overall health, well-being, and quality of life. They will understand the symptoms of, and treatments for, common sleep disorders. Patients will also be able to identify daytime and nighttime behaviors which impact sleep, and they will be able to apply these tools to help manage sleep-related challenges. The purpose of this lesson is to provide patients with a general overview of sleep and outline the importance of quality sleep. Patients will learn about a few of the most common sleep disorders. Patients will also be introduced to the concept of "sleep hygiene," and discover ways to self-manage certain sleeping problems through simple daily behavior changes. Finally, the workshop will motivate  patients by clarifying the links between quality sleep and their goals of heart-healthy living.   Recognizing and Reducing Stress Clinical staff led group instruction and group discussion with PowerPoint presentation and patient guidebook. To enhance the learning environment the use of posters, models and videos may be added. At the conclusion of this workshop, patients will be able to understand the types of stress reactions, differentiate between acute and chronic stress, and recognize the impact that chronic stress has on their health. They will also be able to apply different coping mechanisms, such as reframing negative self-talk. Patients will have the opportunity to practice a variety of stress management techniques, such as deep abdominal breathing, progressive muscle relaxation, and/or guided imagery.  The purpose of this lesson is to educate patients on the role of stress in their lives and to provide healthy techniques for coping with it.  Learning Barriers/Preferences:  Learning Barriers/Preferences - 01/29/23 1328       Learning Barriers/Preferences   Learning Barriers Sight   Decreased vision needs cataract surgery, uses a cane has issues with chronic pain lower back and both knees   Learning Preferences None   No learning preferences were indicated            Education Topics:  Knowledge Questionnaire Score:  Knowledge Questionnaire Score - 01/29/23 1242       Knowledge Questionnaire Score   Pre Score 17/24             Core Components/Risk Factors/Patient Goals at Admission:  Personal Goals and Risk Factors at Admission - 01/29/23 1242       Core Components/Risk Factors/Patient Goals on Admission    Weight Management Yes;Obesity;Weight Loss    Intervention Weight Management/Obesity: Establish reasonable short term and long term weight goals.;Obesity: Provide education and appropriate resources to help participant work on and attain dietary goals.    Admit  Weight 173 lb 4.5 oz (78.6 kg)    Expected Outcomes Short Term: Continue to assess and modify interventions until short term weight is achieved;Long Term: Adherence to nutrition and physical activity/exercise program aimed toward attainment of established weight goal;Weight Loss: Understanding of general recommendations for a balanced deficit meal plan, which promotes 1-2 lb weight loss per week and includes a negative energy balance of [phone] kcal/d    Diabetes Yes    Intervention Provide education about signs/symptoms and action to take for hypo/hyperglycemia.;Provide education about proper nutrition, including hydration, and aerobic/resistive exercise prescription along with prescribed medications to achieve blood glucose in normal ranges: Fasting glucose 65-99 mg/dL  Expected Outcomes Short Term: Participant verbalizes understanding of the signs/symptoms and immediate care of hyper/hypoglycemia, proper foot care and importance of medication, aerobic/resistive exercise and nutrition plan for blood glucose control.;Long Term: Attainment of HbA1C < 7%.    Hypertension Yes    Intervention Provide education on lifestyle modifcations including regular physical activity/exercise, weight management, moderate sodium restriction and increased consumption of fresh fruit, vegetables, and low fat dairy, alcohol moderation, and smoking cessation.;Monitor prescription use compliance.    Expected Outcomes Short Term: Continued assessment and intervention until BP is < 140/41mm HG in hypertensive participants. < 130/68mm HG in hypertensive participants with diabetes, heart failure or chronic kidney disease.;Long Term: Maintenance of blood pressure at goal levels.    Lipids Yes    Intervention Provide education and support for participant on nutrition & aerobic/resistive exercise along with prescribed medications to achieve LDL 70mg , HDL >40mg .    Expected Outcomes Short Term: Participant states understanding of  desired cholesterol values and is compliant with medications prescribed. Participant is following exercise prescription and nutrition guidelines.;Long Term: Cholesterol controlled with medications as prescribed, with individualized exercise RX and with personalized nutrition plan. Value goals: LDL < 70mg , HDL > 40 mg.    Stress Yes    Intervention Offer individual and/or small group education and counseling on adjustment to heart disease, stress management and health-related lifestyle change. Teach and support self-help strategies.;Refer participants experiencing significant psychosocial distress to appropriate mental health specialists for further evaluation and treatment. When possible, include family members and significant others in education/counseling sessions.    Expected Outcomes Short Term: Participant demonstrates changes in health-related behavior, relaxation and other stress management skills, ability to obtain effective social support, and compliance with psychotropic medications if prescribed.;Long Term: Emotional wellbeing is indicated by absence of clinically significant psychosocial distress or social isolation.             Core Components/Risk Factors/Patient Goals Review:    Core Components/Risk Factors/Patient Goals at Discharge (Final Review):    ITP Comments:  ITP Comments     Row Name 01/29/23 1159           ITP Comments Introduction to [facility]/Intensive Cardiac Rehab. Initial Orientation packet Reviewed with the patient                Comments: Participant attended orientation for the cardiac rehabilitation program on  01/29/2023  to perform initial intake and exercise walk test. Patient introduced to the [facility] education and orientation packet was reviewed. Completed 6-minute walk test, measurements, initial ITP, and exercise prescription. Vital signs stable. Telemetry-normal sinus rhythm, bundle branch block, downward QRS. T wave  inversion. Patient is deconditioned and uses a cane.Annella has chronic lower back and knee pain.  Patient used a rollator today during her 6 minute walk test for stability. Sugey took a rest break for 1 minute and 15 seconds.Harrell Gave RN BSN   Service time was from 1014 to 1202.

## 2023-01-29 NOTE — Progress Notes (Signed)
Cardiac Rehab Medication Review by a Nurse  Does the patient  feel that his/her medications are working for him/her?  yes  Has the patient been experiencing any side effects to the medications prescribed?  no  Does the patient measure his/her own blood pressure or blood glucose at home?  yes   Does the patient have any problems obtaining medications due to transportation or finances?   no  Understanding of regimen: good Understanding of indications: good Potential of compliance: good    Nurse comments: Mercedee is taking her medications as prescribed and has a good understanding of what his medications are for. Marquis checks her CBG's a few times a week. Kace has a BP cuff she does not check her blood pressures on a regular basis.    Christa See Orrin Yurkovich RN 01/29/2023 1:33 PM

## 2023-01-30 DIAGNOSIS — E669 Obesity, unspecified: Secondary | ICD-10-CM | POA: Diagnosis not present

## 2023-01-30 DIAGNOSIS — I10 Essential (primary) hypertension: Secondary | ICD-10-CM | POA: Diagnosis not present

## 2023-01-30 DIAGNOSIS — E7800 Pure hypercholesterolemia, unspecified: Secondary | ICD-10-CM | POA: Diagnosis not present

## 2023-01-30 DIAGNOSIS — F419 Anxiety disorder, unspecified: Secondary | ICD-10-CM | POA: Diagnosis not present

## 2023-01-30 DIAGNOSIS — E1169 Type 2 diabetes mellitus with other specified complication: Secondary | ICD-10-CM | POA: Diagnosis not present

## 2023-01-30 DIAGNOSIS — R748 Abnormal levels of other serum enzymes: Secondary | ICD-10-CM | POA: Diagnosis not present

## 2023-01-30 DIAGNOSIS — F3341 Major depressive disorder, recurrent, in partial remission: Secondary | ICD-10-CM | POA: Diagnosis not present

## 2023-01-30 DIAGNOSIS — I2510 Atherosclerotic heart disease of native coronary artery without angina pectoris: Secondary | ICD-10-CM | POA: Diagnosis not present

## 2023-01-30 DIAGNOSIS — M545 Low back pain, unspecified: Secondary | ICD-10-CM | POA: Diagnosis not present

## 2023-02-02 ENCOUNTER — Other Ambulatory Visit: Payer: Self-pay | Admitting: Cardiology

## 2023-02-04 ENCOUNTER — Encounter (HOSPITAL_COMMUNITY)
Admission: RE | Admit: 2023-02-04 | Discharge: 2023-02-04 | Disposition: A | Discharge status: Home / Self Care | Payer: Medicare HMO | Source: Ambulatory Visit | Attending: Cardiology | Admitting: Cardiology

## 2023-02-04 DIAGNOSIS — Z955 Presence of coronary angioplasty implant and graft: Secondary | ICD-10-CM | POA: Diagnosis present

## 2023-02-04 DIAGNOSIS — Z48812 Encounter for surgical aftercare following surgery on the circulatory system: Secondary | ICD-10-CM | POA: Insufficient documentation

## 2023-02-04 LAB — GLUCOSE, CAPILLARY
Glucose-Capillary: 158 mg/dL — ABNORMAL HIGH (ref 70–99)
Glucose-Capillary: 180 mg/dL — ABNORMAL HIGH (ref 70–99)

## 2023-02-04 NOTE — Telephone Encounter (Signed)
Refill to pharmacy 

## 2023-02-04 NOTE — Progress Notes (Signed)
Daily Session Note  Patient Details  Name: Angel Reynolds MRN: [id number] Date of Birth: 1955/11/13 Referring Provider:   Flowsheet Row INTENSIVE CARDIAC REHAB ORIENT from 01/29/2023 in [facility] for [facility], V[facility], & [place]  Referring Provider Park Liter, MD       Encounter Date: 02/04/2023  Check In:  Session Check In - 02/04/23 1031       Check-In   Supervising physician immediately available to respond to emergencies [facility] - Physician supervision    Physician(s) Ambrose Pancoast NP    Location [facility]    Staff Present Barnet Pall, RN, Deland Pretty, MS, ACSM-CEP, Exercise Physiologist;Bailey Pearline Cables, MS, Exercise Physiologist;David Lilyan Punt, MS, ACSM-CEP, CCRP, Exercise Physiologist;Johnny Starleen Blue, MS, Exercise Physiologist;Jetta Walker BS, ACSM-CEP, Exercise Physiologist    Virtual Visit No    Medication changes reported     No    Fall or balance concerns reported    No    Tobacco Cessation No Change    Current number of cigarettes/nicotine per day     0    Warm-up and Cool-down Performed as group-led instruction   Cardiac Rehab Orientation   Resistance Training Performed Yes    VAD Patient? No    PAD/SET Patient? No      Pain Assessment   Currently in Pain? No/denies    Pain Score 0-No pain    Pain Onset More than a month ago    Multiple Pain Sites No             Capillary Blood Glucose: Results for orders placed or performed during the hospital encounter of 02/04/23 (from the past 24 hour(s))  Glucose, capillary     Status: Abnormal   Collection Time: 02/04/23 10:28 AM  Result Value Ref Range   Glucose-Capillary 158 (H) 70 - 99 mg/dL  Glucose, capillary     Status: Abnormal   Collection Time: 02/04/23 11:10 AM  Result Value Ref Range   Glucose-Capillary 180 (H) 70 - 99 mg/dL     Exercise Prescription Changes - 02/04/23 1028       Response to Exercise   Blood Pressure (Admit) 128/52    Blood  Pressure (Exercise) 132/54    Blood Pressure (Exit) 110/52    Heart Rate (Admit) 65 bpm    Heart Rate (Exercise) 83 bpm    Heart Rate (Exit) 71 bpm    Rating of Perceived Exertion (Exercise) 12    Symptoms None    Comments Off to a good start with exercise.    Duration Progress to 30 minutes of  aerobic without signs/symptoms of physical distress    Intensity THRR unchanged      Progression   Progression Continue to progress workloads to maintain intensity without signs/symptoms of physical distress.    Average METs 1.2      Resistance Training   Training Prescription Yes    Weight 2 lbs    Reps 10-15    Time 10 Minutes      Interval Training   Interval Training No      NuStep   Level 1    SPM 38    Minutes 25    METs 1.2             Social History   Tobacco Use  Smoking Status Former   Types: E-cigarettes, Cigarettes  Smokeless Tobacco Never  Tobacco Comments   Stopped vaping in 2023    Goals Met:  Exercise tolerated  well No report of concerns or symptoms today Strength training completed today  Goals Unmet:  Not Applicable  Comments: Pt started cardiac rehab today.  Pt tolerated light exercise without difficulty. VSS, telemetry-Sinus Rhythm downward QRS, asymptomatic.  Medication list reconciled. Pt denies barriers to medicaiton compliance.  PSYCHOSOCIAL ASSESSMENT:  PHQ-9. Pt exhibits positive coping skills, hopeful outlook with supportive family. No psychosocial needs identified at this time, no psychosocial interventions necessary.  Shikara is on an antidepressant. Denies being actively depressed will review quality of life questionnaire this week .  Pt oriented to exercise equipment and routine.    Understanding verbalized. Will review quality of life questionnaire in the upcoming week. Patient is deconditioned. Used a rollator for stability. Harrell Gave RN BSN    Dr. Fransico Him is Medical Director for Cardiac Rehab at [facility].

## 2023-02-06 ENCOUNTER — Encounter (HOSPITAL_COMMUNITY)
Admission: RE | Admit: 2023-02-06 | Discharge: 2023-02-06 | Disposition: A | Discharge status: Home / Self Care | Payer: Medicare HMO | Source: Ambulatory Visit | Attending: Cardiology | Admitting: Cardiology

## 2023-02-06 DIAGNOSIS — Z955 Presence of coronary angioplasty implant and graft: Secondary | ICD-10-CM

## 2023-02-06 LAB — GLUCOSE, CAPILLARY
Glucose-Capillary: 155 mg/dL — ABNORMAL HIGH (ref 70–99)
Glucose-Capillary: 170 mg/dL — ABNORMAL HIGH (ref 70–99)

## 2023-02-08 ENCOUNTER — Encounter (HOSPITAL_COMMUNITY)
Admission: RE | Admit: 2023-02-08 | Discharge: 2023-02-08 | Disposition: A | Discharge status: Home / Self Care | Payer: Medicare HMO | Source: Ambulatory Visit | Attending: Cardiology | Admitting: Cardiology

## 2023-02-08 DIAGNOSIS — Z955 Presence of coronary angioplasty implant and graft: Secondary | ICD-10-CM | POA: Diagnosis not present

## 2023-02-08 LAB — GLUCOSE, CAPILLARY
Glucose-Capillary: 120 mg/dL — ABNORMAL HIGH (ref 70–99)
Glucose-Capillary: 164 mg/dL — ABNORMAL HIGH (ref 70–99)

## 2023-02-11 ENCOUNTER — Encounter (HOSPITAL_COMMUNITY)
Admission: RE | Admit: 2023-02-11 | Discharge: 2023-02-11 | Disposition: A | Discharge status: Home / Self Care | Payer: Medicare HMO | Source: Ambulatory Visit | Attending: Cardiology | Admitting: Cardiology

## 2023-02-11 DIAGNOSIS — Z955 Presence of coronary angioplasty implant and graft: Secondary | ICD-10-CM | POA: Diagnosis not present

## 2023-02-13 ENCOUNTER — Encounter (HOSPITAL_COMMUNITY)
Admission: RE | Admit: 2023-02-13 | Discharge: 2023-02-13 | Disposition: A | Discharge status: Home / Self Care | Payer: Medicare HMO | Source: Ambulatory Visit | Attending: Cardiology | Admitting: Cardiology

## 2023-02-13 ENCOUNTER — Telehealth: Payer: Self-pay

## 2023-02-13 ENCOUNTER — Telehealth: Payer: Self-pay | Admitting: Cardiology

## 2023-02-13 DIAGNOSIS — Z48812 Encounter for surgical aftercare following surgery on the circulatory system: Secondary | ICD-10-CM | POA: Diagnosis not present

## 2023-02-13 DIAGNOSIS — Z955 Presence of coronary angioplasty implant and graft: Secondary | ICD-10-CM

## 2023-02-13 DIAGNOSIS — R079 Chest pain, unspecified: Secondary | ICD-10-CM

## 2023-02-13 NOTE — Telephone Encounter (Signed)
Pt c/o of Chest Pain: STAT if CP now or developed within 24 hours  1. Are you having CP right now? no  2. Are you experiencing any other symptoms (ex. SOB, nausea, vomiting, sweating)? no  3. How long have you been experiencing CP? Started yesterday  4. Is your CP continuous or coming and going? Coming and going  5. Have you taken Nitroglycerin? No   Maria with Cardiac Rehab calling to speak with a nurse in regards to patient's chest pain.  ?

## 2023-02-13 NOTE — Telephone Encounter (Signed)
Byrd Hesselbach called from [facility] and stated that pt was having some chest pain when she was coming in to Rehab. She did not take a nitro. They did a 12 lead EKG and per the Rehab Physician all looked good. She is on Imdur. They asked if you would look at the EKG and note in Epic and see if you wanted to make any changes. She was not having any pain at the time of the call. Pt also scheduled for exercise on Friday and wants to know if she can come back then. Byrd [phone]

## 2023-02-13 NOTE — Progress Notes (Signed)
Incomplete Session Note  Patient Details  Name: Angel Reynolds MRN: [id number] Date of Birth: 27-May-1956 Referring Provider:   Flowsheet Row INTENSIVE CARDIAC REHAB ORIENT from 01/29/2023 in [facility] for Heart, Vascular, & [facility]  Referring Provider Georgeanna Lea, MD       [name] did not complete her rehab session.  Reported having chest pain walking from the parking lot to the bathroom rated a 7/10. Patient sat down in the lobby took out her sublingual nitro pour in her hand but did not take. Pain resolved after sitting down to rest. BP 142/70. Oxygen saturation 97% on room air. Discussed with Carlos Levering NP. [name] ordered a 12 lead ECG. 12 Lead ECG obtained. Deborah reviewed 12 lead ECG noted no changes. Deborah recommended to discuss with Dr Felicie Morn office to see if he wants to make any medication adjustments. [name] said she experienced chest pain last night while mopping the floor last night. [name] rated the chest pain a 7/10 yesterday. [name] did not take a nitroglycerin at that time.  Medications reviewed. Taking as prescribed. [name] is still not taking ozempic. [name] did not exercise today. Carlos Levering NP said that [name] can resume exercise on her next scheduled session. Instructed to call Dr Charlynne Pander if she has any more chest pain or if need be go to the emergency room. Patient and husband aware and understanding. Spoke with [name] at Dr Harrie Foreman office and notified about today's events. Dr Lowella Curb office will contact [name] they will contact if any changes are made to her medications. Khori left cardiac rehab without complaints or symptoms. Will continue to monitor the patient throughout  the program. Thayer Headings RN BSN

## 2023-02-13 NOTE — Telephone Encounter (Signed)
Spoke with Byrd Hesselbach from [place] [facility]- Note in previous phone note

## 2023-02-14 MED ORDER — ISOSORBIDE MONONITRATE ER 120 MG PO TB24: 120.0000 mg | ORAL_TABLET | Freq: Every day | ORAL | 3 refills | Status: DC

## 2023-02-14 NOTE — Addendum Note (Signed)
Addended by: Baldo Ash D on: 02/14/2023 04:46 PM   Modules accepted: Orders

## 2023-02-14 NOTE — Telephone Encounter (Signed)
Spoke with pt. Advised per Dr. Bing Matter to increase Imdur to 120mg . Pt agreed and had no further questions. Sent message to Cardiac rehab as well.

## 2023-02-15 ENCOUNTER — Encounter (HOSPITAL_COMMUNITY)
Admission: RE | Admit: 2023-02-15 | Discharge: 2023-02-15 | Disposition: A | Discharge status: Home / Self Care | Payer: Medicare HMO | Source: Ambulatory Visit | Attending: Cardiology | Admitting: Cardiology

## 2023-02-15 DIAGNOSIS — Z955 Presence of coronary angioplasty implant and graft: Secondary | ICD-10-CM

## 2023-02-18 ENCOUNTER — Encounter (HOSPITAL_COMMUNITY)
Admission: RE | Admit: 2023-02-18 | Discharge: 2023-02-18 | Disposition: A | Discharge status: Home / Self Care | Payer: Medicare HMO | Source: Ambulatory Visit | Attending: Cardiology | Admitting: Cardiology

## 2023-02-18 DIAGNOSIS — R079 Chest pain, unspecified: Secondary | ICD-10-CM

## 2023-02-18 DIAGNOSIS — Z955 Presence of coronary angioplasty implant and graft: Secondary | ICD-10-CM | POA: Diagnosis not present

## 2023-02-18 DIAGNOSIS — E162 Hypoglycemia, unspecified: Secondary | ICD-10-CM

## 2023-02-18 LAB — GLUCOSE, CAPILLARY
Glucose-Capillary: 174 mg/dL — ABNORMAL HIGH (ref 70–99)
Glucose-Capillary: 40 mg/dL — CL (ref 70–99)
Glucose-Capillary: 78 mg/dL (ref 70–99)

## 2023-02-18 NOTE — Progress Notes (Signed)
Cardiac Individual Treatment Plan  Patient Details  Name: Angel Reynolds MRN: [id number] Date of Birth: June 27, 1956 Referring Provider:   Flowsheet Row INTENSIVE CARDIAC REHAB ORIENT from 01/29/2023 in [facility]  Referring Provider Georgeanna Lea, MD       Initial Encounter Date:  Flowsheet Row INTENSIVE CARDIAC REHAB ORIENT from 01/29/2023 in [facility]  Date 01/29/23       Visit Diagnosis: 12/11/22 S/P DES Mid LAD  Chest pain, unspecified type  Hypoglycemia - Plan: POCT CBG, POCT CBG, POCT CBG, POCT CBG  Patient's Home Medications on Admission:  Current Outpatient Medications:    aspirin 81 MG tablet, Take 81 mg by mouth every evening., Disp: , Rfl:    atorvastatin (LIPITOR) 80 MG tablet, Take 80 mg by mouth every evening., Disp: , Rfl:    Cholecalciferol 25 MCG (1000 UT) capsule, Take 1,000 Units by mouth in the morning., Disp: , Rfl:    clopidogrel (PLAVIX) 75 MG tablet, Take 1 tablet (75 mg total) by mouth daily., Disp: 30 tablet, Rfl: 6   clotrimazole-betamethasone (LOTRISONE) cream, Apply 1 application. topically daily as needed (Eczema)., Disp: , Rfl:    escitalopram (LEXAPRO) 10 MG tablet, Take 10 mg by mouth every evening., Disp: , Rfl:    famotidine (PEPCID) 20 MG tablet, Take 20 mg by mouth every 12 (twelve) hours as needed for heartburn or indigestion., Disp: , Rfl:    glimepiride (AMARYL) 4 MG tablet, Take 4 mg by mouth 2 (two) times daily., Disp: , Rfl:    HYDROcodone-acetaminophen (NORCO/VICODIN) 5-325 MG tablet, Take 1 tablet by mouth every 8 (eight) hours as needed for pain., Disp: , Rfl:    isosorbide mononitrate (IMDUR) 120 MG 24 hr tablet, Take 1 tablet (120 mg total) by mouth daily., Disp: 90 tablet, Rfl: 3   lisinopril (ZESTRIL) 10 MG tablet, Take 1 tablet by mouth once daily, Disp: 90 tablet, Rfl: 0   Menthol, Topical Analgesic, (ICY  HOT) 5 % PTCH, Place 1 patch onto the skin daily as needed (pain.)., Disp: , Rfl:    metFORMIN (GLUCOPHAGE) 850 MG tablet, Take 850 mg by mouth 3 (three) times daily., Disp: , Rfl:    metoprolol tartrate (LOPRESSOR) 25 MG tablet, Take 1 tablet by mouth twice daily, Disp: 180 tablet, Rfl: 3   nitroGLYCERIN (NITROSTAT) 0.4 MG SL tablet, Place 1 tablet (0.4 mg total) under the tongue every 5 (five) minutes as needed for chest pain., Disp: 35 tablet, Rfl: 2   OZEMPIC, 1 MG/DOSE, 4 MG/3ML SOPN, Inject 1 mg into the skin once a week. (Patient not taking: Reported on 01/29/2023), Disp: , Rfl:    traMADol (ULTRAM) 50 MG tablet, Take 50 mg by mouth 3 (three) times daily as needed for pain., Disp: , Rfl:   Past Medical History: Past Medical History:  Diagnosis Date   Anxiety    Benign essential hypertension    Chronic constipation    Coronary artery disease    Diabetes mellitus    AODM   Elevated liver enzymes    Hypercholesteremia    Hyperlipidemia    Lower back pain    Obesity    Osteoarthritis of left knee 01/06/2021   Peripheral neuropathy    Recurrent major depressive disorder in partial remission (HCC)    Type 2 diabetes mellitus with obesity (HCC)     Tobacco Use: Social History   Tobacco  Use  Smoking Status Former   Types: E-cigarettes, Cigarettes  Smokeless Tobacco Never  Tobacco Comments   Stopped vaping in 2023    Labs: Review Flowsheet       Latest Ref Rng & Units 06/03/2008 03/19/2022 07/25/2022  Labs for ITP Cardiac and Pulmonary Rehab  Cholestrol 100 - 199 mg/dL - - 161   LDL (calc) 0 - 99 mg/dL - - 71   HDL-C >09 mg/dL - - 41   Trlycerides 0 - 149 mg/dL - - 604   Hemoglobin V4U 4.8 - 5.6 % - 8.2  -  TCO2 - 29  - -    Capillary Blood Glucose: Lab Results  Component Value Date   GLUCAP 174 (H) 02/18/2023   GLUCAP 78 02/18/2023   GLUCAP 40 (LL) 02/18/2023   GLUCAP 120 (H) 02/08/2023   GLUCAP 164 (H) 02/08/2023     Exercise Target Goals: Exercise Program  Goal: Individual exercise prescription set using results from initial 6 min walk test and THRR while considering  patient's activity barriers and safety.   Exercise Prescription Goal: Initial exercise prescription builds to 30-45 minutes a day of aerobic activity, 2-3 days per week.  Home exercise guidelines will be given to patient during program as part of exercise prescription that the participant will acknowledge.  Activity Barriers & Risk Stratification:  Activity Barriers & Cardiac Risk Stratification - 01/29/23 1135       Activity Barriers & Cardiac Risk Stratification   Activity Barriers Arthritis;Back Problems;Joint Problems;Balance Concerns;Assistive Device;Other (comment)    Comments Chronic low back pain, DJD;  osteoarthritis in both knees, bone-on-bone.    Cardiac Risk Stratification Moderate             6 Minute Walk:  6 Minute Walk     Row Name 01/29/23 1237         6 Minute Walk   Phase Initial     Distance 472 feet     Walk Time 4.85 minutes     # of Rest Breaks 1  Seated for 1.15 minutes     MPH 1.1     METS 1.52     RPE 12     Perceived Dyspnea  0     VO2 Peak 5.31     Symptoms Yes (comment)     Comments Low back, bilateral knee pain-chronic, 6/10 on pain scale.     Resting HR 70 bpm     Resting BP 128/58     Resting Oxygen Saturation  98 %     Exercise Oxygen Saturation  during 6 min walk 97 %     Max Ex. HR 93 bpm     Max Ex. BP 166/62     2 Minute Post BP 122/58              Oxygen Initial Assessment:   Oxygen Re-Evaluation:   Oxygen Discharge (Final Oxygen Re-Evaluation):   Initial Exercise Prescription:  Initial Exercise Prescription - 01/29/23 1200       Date of Initial Exercise RX and Referring Provider   Date 01/29/23    Referring Provider Georgeanna Lea, MD    Expected Discharge Date 04/12/23      NuStep   Level 1    SPM 70    Minutes 20    METs 1.5      Prescription Details   Frequency (times per week) 3     Duration Progress to 30 minutes of continuous aerobic without signs/symptoms  of physical distress      Intensity   THRR 40-80% of Max Heartrate 61-122    Ratings of Perceived Exertion 11-13    Perceived Dyspnea 0-4      Progression   Progression Continue to progress workloads to maintain intensity without signs/symptoms of physical distress.      Resistance Training   Training Prescription Yes    Weight 2 lbs    Reps 10-15             Perform Capillary Blood Glucose checks as needed.  Exercise Prescription Changes:   Exercise Prescription Changes     Row Name 02/04/23 1028 02/18/23 1031           Response to Exercise   Blood Pressure (Admit) 128/52 128/60      Blood Pressure (Exercise) 132/54 124/68      Blood Pressure (Exit) 110/52 136/54      Heart Rate (Admit) 65 bpm 70 bpm      Heart Rate (Exercise) 83 bpm 88 bpm      Heart Rate (Exit) 71 bpm 65 bpm      Rating of Perceived Exertion (Exercise) 12 11      Symptoms None Hypoglycemic after exercise.      Comments Off to a good start with exercise. Low blood sugar, no weights today.      Duration Progress to 30 minutes of  aerobic without signs/symptoms of physical distress Progress to 30 minutes of  aerobic without signs/symptoms of physical distress      Intensity THRR unchanged THRR unchanged        Progression   Progression Continue to progress workloads to maintain intensity without signs/symptoms of physical distress. Continue to progress workloads to maintain intensity without signs/symptoms of physical distress.      Average METs 1.2 1.4        Resistance Training   Training Prescription Yes No      Weight 2 lbs --      Reps 10-15 --      Time 10 Minutes --        Interval Training   Interval Training No No        NuStep   Level 1 1      SPM 38 58      Minutes 25 25      METs 1.2 1.4               Exercise Comments:   Exercise Comments     Row Name 02/04/23 1130 02/06/23 1100  02/18/23 1053       Exercise Comments Patient tolerated low intensity exercise well without symptoms. Oriented patient to warm-up/cool-down stretches seated in chair. Reviewed METs with patient. Reviewed goals with patient.              Exercise Goals and Review:   Exercise Goals     Row Name 01/29/23 1135             Exercise Goals   Increase Physical Activity Yes       Intervention Provide advice, education, support and counseling about physical activity/exercise needs.;Develop an individualized exercise prescription for aerobic and resistive training based on initial evaluation findings, risk stratification, comorbidities and participant's personal goals.       Expected Outcomes Short Term: Attend rehab on a regular basis to increase amount of physical activity.;Long Term: Add in home exercise to make exercise part of routine and to increase amount of physical activity.;Long Term:  Exercising regularly at least 3-5 days a week.       Increase Strength and Stamina Yes       Intervention Provide advice, education, support and counseling about physical activity/exercise needs.;Develop an individualized exercise prescription for aerobic and resistive training based on initial evaluation findings, risk stratification, comorbidities and participant's personal goals.       Expected Outcomes Short Term: Increase workloads from initial exercise prescription for resistance, speed, and METs.;Short Term: Perform resistance training exercises routinely during rehab and add in resistance training at home;Long Term: Improve cardiorespiratory fitness, muscular endurance and strength as measured by increased METs and functional capacity ( )       Able to understand and use rate of perceived exertion (RPE) scale Yes       Intervention Provide education and explanation on how to use RPE scale       Expected Outcomes Short Term: Able to use RPE daily in rehab to express subjective intensity level;Long  Term:  Able to use RPE to guide intensity level when exercising independently       Knowledge and understanding of Target Heart Rate Range (THRR) Yes       Intervention Provide education and explanation of THRR including how the numbers were predicted and where they are located for reference       Expected Outcomes Short Term: Able to state/look up THRR;Long Term: Able to use THRR to govern intensity when exercising independently;Short Term: Able to use daily as guideline for intensity in rehab       Able to check pulse independently Yes       Intervention Provide education and demonstration on how to check pulse in carotid and radial arteries.;Review the importance of being able to check your own pulse for safety during independent exercise       Expected Outcomes Short Term: Able to explain why pulse checking is important during independent exercise;Long Term: Able to check pulse independently and accurately       Understanding of Exercise Prescription Yes       Intervention Provide education, explanation, and written materials on patient's individual exercise prescription       Expected Outcomes Short Term: Able to explain program exercise prescription;Long Term: Able to explain home exercise prescription to exercise independently                Exercise Goals Re-Evaluation :  Exercise Goals Re-Evaluation     Row Name 02/04/23 1130 02/18/23 1053           Exercise Goal Re-Evaluation   Exercise Goals Review Increase Physical Activity;Increase Strength and Stamina;Able to understand and use rate of perceived exertion (RPE) scale Increase Physical Activity;Increase Strength and Stamina;Able to understand and use rate of perceived exertion (RPE) scale      Comments Patient able to understand and use RPE scale appropriately. Patient is limited by knee pain. Per patient both knees bone on bone, and she needs knee replacement. Her goal is to be able to get around better. Patient is walking her  dog and has a Slim cycle that she's not currently using but can incorporate into her routine.      Expected Outcomes Progress workloads as tolerated to help increase strength and stamina. Continue to progress workloads as tolerated. Add 1 -2 days walking at home, taking rest breaks as tolerated.               Discharge Exercise Prescription (Final Exercise Prescription Changes):  Exercise Prescription Changes -  02/18/23 1031       Response to Exercise   Blood Pressure (Admit) 128/60    Blood Pressure (Exercise) 124/68    Blood Pressure (Exit) 136/54    Heart Rate (Admit) 70 bpm    Heart Rate (Exercise) 88 bpm    Heart Rate (Exit) 65 bpm    Rating of Perceived Exertion (Exercise) 11    Symptoms Hypoglycemic after exercise.    Comments Low blood sugar, no weights today.    Duration Progress to 30 minutes of  aerobic without signs/symptoms of physical distress    Intensity THRR unchanged      Progression   Progression Continue to progress workloads to maintain intensity without signs/symptoms of physical distress.    Average METs 1.4      Resistance Training   Training Prescription No      Interval Training   Interval Training No      NuStep   Level 1    SPM 58    Minutes 25    METs 1.4             Nutrition:  Target Goals: Understanding of nutrition guidelines, daily intake of sodium 1500mg , cholesterol 200mg , calories 30% from fat and 7% or less from saturated fats, daily to have 5 or more servings of fruits and vegetables.  Biometrics:  Pre Biometrics - 01/29/23 1157       Pre Biometrics   Waist Circumference 44.5 inches    Hip Circumference 46.25 inches    Waist to Hip Ratio 0.96 %    Triceps Skinfold 22 mm    % Body Fat 42.8 %    Grip Strength 16 kg    Flexibility --   Not performed, chronic back pain.   Single Leg Stand 0.87 seconds              Nutrition Therapy Plan and Nutrition Goals:  Nutrition Therapy & Goals - 02/04/23 1134        Nutrition Therapy   Diet Heart healthy/carbohydrate consistent diet    Drug/Food Interactions Statins/Certain Fruits      Personal Nutrition Goals   Nutrition Goal Patient to identify strategies for reducing cardiovascular risk by attending the [employer] education and nutrition series weekly.    Personal Goal #2 Patient to improve diet quality by using the plate method as a guide for meal planning to include lean protein/plant protein, fruits, vegetables, whole grains, nonfat dairy as part of a well-balanced diet.    Personal Goal #3 Patient to reduce sodium to 1500mg  per day    Comments Angel Reynolds reports pre-contemplation toward dietary changes/lifestyle changes. She reports being a "picky" eater and reports eating very few fruits and vegetables. Her husband does a lot of the cooking and is trying to make heart healthy choices. Crystol will benefit from participation in intensive cardiac rehab for nutrition, exercise, and lifestyle modification.      Intervention Plan   Intervention Prescribe, educate and counsel regarding individualized specific dietary modifications aiming towards targeted core components such as weight, hypertension, lipid management, diabetes, heart failure and other comorbidities.;Nutrition handout(s) given to patient.    Expected Outcomes Short Term Goal: Understand basic principles of dietary content, such as calories, fat, sodium, cholesterol and nutrients.;Long Term Goal: Adherence to prescribed nutrition plan.             Nutrition Assessments:  MEDIFICTS Score Key: ?70 Need to make dietary changes  40-70 Heart Healthy Diet ? 40 Therapeutic Level Cholesterol Diet  Flowsheet Row INTENSIVE CARDIAC REHAB from [facility]/04/2023 in [facility]  Picture Your Plate Total Score on Discharge 56      Picture Your Plate Scores: <16 Unhealthy dietary pattern with much room for improvement. 41-50 Dietary pattern unlikely to  meet recommendations for good health and room for improvement. 51-60 More healthful dietary pattern, with some room for improvement.  >60 Healthy dietary pattern, although there may be some specific behaviors that could be improved.    Nutrition Goals Re-Evaluation:  Nutrition Goals Re-Evaluation     Row Name 02/04/23 1134             Goals   Current Weight 174 lb 9.7 oz (79.2 kg)       Comment lipoprotein A 342.6, A1c 8.2, lipids WNL, B12 low (107)       Expected Outcome Angel Reynolds reports pre-contemplation toward dietary changes/lifestyle changes. She reports being a "picky" eater and reports eating very few fruits and vegetables. Her husband does a lot of the cooking and is trying to make heart healthy choices. Onisha will benefit from participation in intensive cardiac rehab for nutrition, exercise, and lifestyle modification.                Nutrition Goals Re-Evaluation:  Nutrition Goals Re-Evaluation     Row Name 02/04/23 1134             Goals   Current Weight 174 lb 9.7 oz (79.2 kg)       Comment lipoprotein A 342.6, A1c 8.2, lipids WNL, B12 low (107)       Expected Outcome Angel Reynolds reports pre-contemplation toward dietary changes/lifestyle changes. She reports being a "picky" eater and reports eating very few fruits and vegetables. Her husband does a lot of the cooking and is trying to make heart healthy choices. Fiana will benefit from participation in intensive cardiac rehab for nutrition, exercise, and lifestyle modification.                Nutrition Goals Discharge (Final Nutrition Goals Re-Evaluation):  Nutrition Goals Re-Evaluation - 02/04/23 1134       Goals   Current Weight 174 lb 9.7 oz (79.2 kg)    Comment lipoprotein A 342.6, A1c 8.2, lipids WNL, B12 low (107)    Expected Outcome Angel Reynolds reports pre-contemplation toward dietary changes/lifestyle changes. She reports being a "picky" eater and reports eating very few fruits and vegetables. Her husband does a lot  of the cooking and is trying to make heart healthy choices. Angel Reynolds will benefit from participation in intensive cardiac rehab for nutrition, exercise, and lifestyle modification.             Psychosocial: Target Goals: Acknowledge presence or absence of significant depression and/or stress, maximize coping skills, provide positive support system. Participant is able to verbalize types and ability to use techniques and skills needed for reducing stress and depression.  Initial Review & Psychosocial Screening:  Initial Psych Review & Screening - 01/29/23 1202       Initial Review   Current issues with Current Stress Concerns;History of Depression;Current Depression    Source of Stress Concerns Chronic Illness;Family   Has chronic pain lower back and bilateral knee pain   Comments Angel Reynolds's daughter needs a heart and lung transplant. Angel Reynolds has chronic lower back and knee pain      Family Dynamics   Good Support System? Yes   Angel Reynolds has her husband, daughter and 2 sisiters for  support     Barriers   Psychosocial barriers to participate in program There are no identifiable barriers or psychosocial needs.      Screening Interventions   Interventions Encouraged to exercise    Expected Outcomes Short Term goal: Utilizing psychosocial counselor, staff and physician to assist with identification of specific Stressors or current issues interfering with healing process. Setting desired goal for each stressor or current issue identified.;Long Term Goal: Stressors or current issues are controlled or eliminated.;Short Term goal: Identification and review with participant of any Quality of Life or Depression concerns found by scoring the questionnaire.             Quality of Life Scores:  Quality of Life - 01/29/23 1241       Quality of Life   Select Quality of Life      Quality of Life Scores   Health/Function Pre 19.1 %    Socioeconomic Pre 18.33 %    Psych/Spiritual Pre 16.79 %    Family  Pre 22.8 %    GLOBAL Pre 19.03 %            Scores of 19 and below usually indicate a poorer quality of life in these areas.  A difference of  2-3 points is a clinically meaningful difference.  A difference of 2-3 points in the total score of the Quality of Life Index has been associated with significant improvement in overall quality of life, self-image, physical symptoms, and general health in studies assessing change in quality of life.  PHQ-9: Review Flowsheet       01/29/2023  Depression screen PHQ 2/9  Decreased Interest 1  Down, Depressed, Hopeless 0  PHQ - 2 Score 1  Altered sleeping 2  Tired, decreased energy 3  Change in appetite 2  Feeling bad or failure about yourself  0  Trouble concentrating 1  Moving slowly or fidgety/restless 0  Suicidal thoughts 0  PHQ-9 Score 9  Difficult doing work/chores Somewhat difficult   Interpretation of Total Score  Total Score Depression Severity:  1-4 = Minimal depression, 5-9 = Mild depression, 10-14 = Moderate depression, 15-19 = Moderately severe depression, 20-27 = Severe depression   Psychosocial Evaluation and Intervention:   Psychosocial Re-Evaluation:  Psychosocial Re-Evaluation     Row Name 02/04/23 1710 02/14/23 1405           Psychosocial Re-Evaluation   Current issues with Current Stress Concerns;History of Depression Current Stress Concerns;History of Depression      Comments Will review quality of life questionnaire in the upcoming week Will review quality of life questionnaire in the near future. Angel Reynolds does have health and family stressors.      Expected Outcomes Angel Reynolds will have decreased or controlled stressors and controlled depression upon completion of intensive cardiac rehab. Angel Reynolds will have decreased or controlled stressors and controlled depression upon completion of intensive cardiac rehab.      Interventions Encouraged to attend Cardiac Rehabilitation for the exercise;Stress management  education;Encouraged to attend Pulmonary Rehabilitation for the exercise Encouraged to attend Cardiac Rehabilitation for the exercise;Stress management education;Encouraged to attend Pulmonary Rehabilitation for the exercise      Continue Psychosocial Services  Follow up required by staff Follow up required by staff        Initial Review   Source of Stress Concerns Family;Chronic Illness;Unable to participate in former interests or hobbies;Unable to perform yard/household activities Family;Chronic Illness;Unable to participate in former interests or hobbies;Unable to perform yard/household activities  Comments Will continue to monitor and offer support as needed Will continue to monitor and offer support as needed               Psychosocial Discharge (Final Psychosocial Re-Evaluation):  Psychosocial Re-Evaluation - 02/14/23 1405       Psychosocial Re-Evaluation   Current issues with Current Stress Concerns;History of Depression    Comments Will review quality of life questionnaire in the near future. Angel Reynolds does have health and family stressors.    Expected Outcomes Angel Reynolds will have decreased or controlled stressors and controlled depression upon completion of intensive cardiac rehab.    Interventions Encouraged to attend Cardiac Rehabilitation for the exercise;Stress management education;Encouraged to attend Pulmonary Rehabilitation for the exercise    Continue Psychosocial Services  Follow up required by staff      Initial Review   Source of Stress Concerns Family;Chronic Illness;Unable to participate in former interests or hobbies;Unable to perform yard/household activities    Comments Will continue to monitor and offer support as needed             Vocational Rehabilitation: Provide vocational rehab assistance to qualifying candidates.   Vocational Rehab Evaluation & Intervention:  Vocational Rehab - 01/29/23 1229       Initial Vocational Rehab Evaluation & Intervention    Assessment shows need for Vocational Rehabilitation No   Angel Reynolds is retired and does not need vocational rehab at this time            Education: Education Goals: Education classes will be provided on a weekly basis, covering required topics. Participant will state understanding/return demonstration of topics presented.    Education     Row Name 02/04/23 1300     Education   Cardiac Education Topics [employer]   Select Workshops     Workshops   Educator Exercise Physiologist   Select Exercise   Exercise Workshop Exercise Basics: Building Your Action Plan   Instruction Review Code 1- Verbalizes Understanding   Class Start Time 1150   Class Stop Time 1235   Class Time Calculation (min) 45 min    Row Name 02/11/23 1400     Education   Cardiac Education Topics [employer]   Nurse, children's   Educator Dietitian   Select Nutrition   Nutrition Nutrition Action Plan   Instruction Review Code 1- Verbalizes Understanding   Class Start Time 1145   Class Stop Time 1219   Class Time Calculation (min) 34 min    Row Name 02/15/23 1400     Education   Cardiac Education Topics Pritikin   Psychologist, forensic Exercise Education   Exercise Education Move It!   Instruction Review Code 1- Verbalizes Understanding   Class Start Time 1150   Class Stop Time 1230   Class Time Calculation (min) 40 min            Core Videos: Exercise    Move It!  Clinical staff conducted group or individual video education with verbal and written material and guidebook.  Patient learns the recommended Pritikin exercise program. Exercise with the goal of living a long, healthy life. Some of the health benefits of exercise include controlled diabetes, healthier blood pressure levels, improved cholesterol levels, improved heart and lung capacity, improved sleep, and better body composition. Everyone should speak with their  doctor before starting or changing an exercise routine.  Biomechanical  Limitations Clinical staff conducted group or individual video education with verbal and written material and guidebook.  Patient learns how biomechanical limitations can impact exercise and how we can mitigate and possibly overcome limitations to have an impactful and balanced exercise routine.  Body Composition Clinical staff conducted group or individual video education with verbal and written material and guidebook.  Patient learns that body composition (ratio of muscle mass to fat mass) is a key component to assessing overall fitness, rather than body weight alone. Increased fat mass, especially visceral belly fat, can put [place] at increased risk for metabolic syndrome, type 2 diabetes, heart disease, and even death. It is recommended to combine diet and exercise (cardiovascular and resistance training) to improve your body composition. Seek guidance from your physician and exercise physiologist before implementing an exercise routine.  Exercise Action Plan Clinical staff conducted group or individual video education with verbal and written material and guidebook.  Patient learns the recommended strategies to achieve and enjoy long-term exercise adherence, including variety, self-motivation, self-efficacy, and positive decision making. Benefits of exercise include fitness, good health, weight management, more energy, better sleep, less stress, and overall well-being.  Medical   Heart Disease Risk Reduction Clinical staff conducted group or individual video education with verbal and written material and guidebook.  Patient learns our heart is our most vital organ as it circulates oxygen, nutrients, white blood cells, and hormones throughout the entire body, and carries waste away. Data supports a plant-based eating plan like the Pritikin Program for its effectiveness in slowing progression of and reversing heart disease. The  video provides a number of recommendations to address heart disease.   Metabolic Syndrome and Belly Fat  Clinical staff conducted group or individual video education with verbal and written material and guidebook.  Patient learns what metabolic syndrome is, how it leads to heart disease, and how one can reverse it and keep it from coming back. You have metabolic syndrome if you have 3 of the following 5 criteria: abdominal obesity, high blood pressure, high triglycerides, low HDL cholesterol, and high blood sugar.  Hypertension and Heart Disease Clinical staff conducted group or individual video education with verbal and written material and guidebook.  Patient learns that high blood pressure, or hypertension, is very common in the [place]. Hypertension is largely due to excessive salt intake, but other important risk factors include being overweight, physical inactivity, drinking too much alcohol, smoking, and not eating enough potassium from fruits and vegetables. High blood pressure is a leading risk factor for heart attack, stroke, congestive heart failure, dementia, kidney failure, and premature death. Long-term effects of excessive salt intake include stiffening of the arteries and thickening of heart muscle and organ damage. Recommendations include ways to reduce hypertension and the risk of heart disease.  Diseases of Our Time - Focusing on Diabetes Clinical staff conducted group or individual video education with verbal and written material and guidebook.  Patient learns why the best way to stop diseases of our time is prevention, through food and other lifestyle changes. Medicine (such as prescription pills and surgeries) is often only a Band-Aid on the problem, not a long-term solution. Most common diseases of our time include obesity, type 2 diabetes, hypertension, heart disease, and cancer. The Pritikin Program is recommended and has been proven to help reduce, reverse, and/or prevent  the damaging effects of metabolic syndrome.  Nutrition   Overview of the Pritikin Eating Plan  Clinical staff conducted group or individual video education with verbal  and written material and guidebook.  Patient learns about the Pritikin Eating Plan for disease risk reduction. The Pritikin Eating Plan emphasizes a wide variety of unrefined, minimally-processed carbohydrates, like fruits, vegetables, whole grains, and legumes. Go, Caution, and Stop food choices are explained. Plant-based and lean animal proteins are emphasized. Rationale provided for low sodium intake for blood pressure control, low added sugars for blood sugar stabilization, and low added fats and oils for coronary artery disease risk reduction and weight management.  Calorie Density  Clinical staff conducted group or individual video education with verbal and written material and guidebook.  Patient learns about calorie density and how it impacts the Pritikin Eating Plan. Knowing the characteristics of the food you choose will help you decide whether those foods will lead to weight gain or weight loss, and whether you want to consume more or less of them. Weight loss is usually a side effect of the Pritikin Eating Plan because of its focus on low calorie-dense foods.  Label Reading  Clinical staff conducted group or individual video education with verbal and written material and guidebook.  Patient learns about the Pritikin recommended label reading guidelines and corresponding recommendations regarding calorie density, added sugars, sodium content, and whole grains.  Dining Out - Part 1  Clinical staff conducted group or individual video education with verbal and written material and guidebook.  Patient learns that restaurant meals can be sabotaging because they can be so high in calories, fat, sodium, and/or sugar. Patient learns recommended strategies on how to positively address this and avoid unhealthy pitfalls.  Facts on  Fats  Clinical staff conducted group or individual video education with verbal and written material and guidebook.  Patient learns that lifestyle modifications can be just as effective, if not more so, as many medications for lowering your risk of heart disease. A Pritikin lifestyle can help to reduce your risk of inflammation and atherosclerosis (cholesterol build-up, or plaque, in the artery walls). Lifestyle interventions such as dietary choices and physical activity address the cause of atherosclerosis. A review of the types of fats and their impact on blood cholesterol levels, along with dietary recommendations to reduce fat intake is also included.  Nutrition Action Plan  Clinical staff conducted group or individual video education with verbal and written material and guidebook.  Patient learns how to incorporate Pritikin recommendations into their lifestyle. Recommendations include planning and keeping personal health goals in mind as an important part of their success.  Healthy Mind-Set    Healthy Minds, Bodies, Hearts  Clinical staff conducted group or individual video education with verbal and written material and guidebook.  Patient learns how to identify when they are stressed. Video will discuss the impact of that stress, as well as the many benefits of stress management. Patient will also be introduced to stress management techniques. The way we think, act, and feel has an impact on our hearts.  How Our Thoughts Can Heal Our Hearts  Clinical staff conducted group or individual video education with verbal and written material and guidebook.  Patient learns that negative thoughts can cause depression and anxiety. This can result in negative lifestyle behavior and serious health problems. Cognitive behavioral therapy is an effective method to help control our thoughts in order to change and improve our emotional outlook.  Additional Videos:  Exercise    Improving Performance  Clinical  staff conducted group or individual video education with verbal and written material and guidebook.  Patient learns to use a non-linear approach  by alternating intensity levels and lengths of time spent exercising to help burn more calories and lose more body fat. Cardiovascular exercise helps improve heart health, metabolism, hormonal balance, blood sugar control, and recovery from fatigue. Resistance training improves strength, endurance, balance, coordination, reaction time, metabolism, and muscle mass. Flexibility exercise improves circulation, posture, and balance. Seek guidance from your physician and exercise physiologist before implementing an exercise routine and learn your capabilities and proper form for all exercise.  Introduction to Yoga  Clinical staff conducted group or individual video education with verbal and written material and guidebook.  Patient learns about yoga, a discipline of the coming together of mind, breath, and body. The benefits of yoga include improved flexibility, improved range of motion, better posture and core strength, increased lung function, weight loss, and positive self-image. Yoga's heart health benefits include lowered blood pressure, healthier heart rate, decreased cholesterol and triglyceride levels, improved immune function, and reduced stress. Seek guidance from your physician and exercise physiologist before implementing an exercise routine and learn your capabilities and proper form for all exercise.  Medical   Aging: Enhancing Your Quality of Life  Clinical staff conducted group or individual video education with verbal and written material and guidebook.  Patient learns key strategies and recommendations to stay in good physical health and enhance quality of life, such as prevention strategies, having an advocate, securing a Health Care Proxy and Power of Attorney, and keeping a list of medications and system for tracking them. It also discusses how to  avoid risk for bone loss.  Biology of Weight Control  Clinical staff conducted group or individual video education with verbal and written material and guidebook.  Patient learns that weight gain occurs because we consume more calories than we burn (eating more, moving less). Even if your body weight is normal, you may have higher ratios of fat compared to muscle mass. Too much body fat puts you at increased risk for cardiovascular disease, heart attack, stroke, type 2 diabetes, and obesity-related cancers. In addition to exercise, following the Pritikin Eating Plan can help reduce your risk.  Decoding Lab Results  Clinical staff conducted group or individual video education with verbal and written material and guidebook.  Patient learns that lab test reflects one measurement whose values change over time and are influenced by many factors, including medication, stress, sleep, exercise, food, hydration, pre-existing medical conditions, and more. It is recommended to use the knowledge from this video to become more involved with your lab results and evaluate your numbers to speak with your doctor.   Diseases of Our Time - Overview  Clinical staff conducted group or individual video education with verbal and written material and guidebook.  Patient learns that according to the CDC, 50% to 70% of chronic diseases (such as obesity, type 2 diabetes, elevated lipids, hypertension, and heart disease) are avoidable through lifestyle improvements including healthier food choices, listening to satiety cues, and increased physical activity.  Sleep Disorders Clinical staff conducted group or individual video education with verbal and written material and guidebook.  Patient learns how good quality and duration of sleep are important to overall health and well-being. Patient also learns about sleep disorders and how they impact health along with recommendations to address them, including discussing with a  physician.  Nutrition  Dining Out - Part 2 Clinical staff conducted group or individual video education with verbal and written material and guidebook.  Patient learns how to plan ahead and communicate in order to maximize their  dining experience in a healthy and nutritious manner. Included are recommended food choices based on the type of restaurant the patient is visiting.   Fueling a Banker conducted group or individual video education with verbal and written material and guidebook.  There is a strong connection between our food choices and our health. Diseases like obesity and type 2 diabetes are very prevalent and are in large-part due to lifestyle choices. The Pritikin Eating Plan provides plenty of food and hunger-curbing satisfaction. It is easy to follow, affordable, and helps reduce health risks.  Menu Workshop  Clinical staff conducted group or individual video education with verbal and written material and guidebook.  Patient learns that restaurant meals can sabotage health goals because they are often packed with calories, fat, sodium, and sugar. Recommendations include strategies to plan ahead and to communicate with the manager, chef, or server to help order a healthier meal.  Planning Your Eating Strategy  Clinical staff conducted group or individual video education with verbal and written material and guidebook.  Patient learns about the Pritikin Eating Plan and its benefit of reducing the risk of disease. The Pritikin Eating Plan does not focus on calories. Instead, it emphasizes high-quality, nutrient-rich foods. By knowing the characteristics of the foods, we choose, we can determine their calorie density and make informed decisions.  Targeting Your Nutrition Priorities  Clinical staff conducted group or individual video education with verbal and written material and guidebook.  Patient learns that lifestyle habits have a tremendous impact on disease  risk and progression. This video provides eating and physical activity recommendations based on your personal health goals, such as reducing LDL cholesterol, losing weight, preventing or controlling type 2 diabetes, and reducing high blood pressure.  Vitamins and Minerals  Clinical staff conducted group or individual video education with verbal and written material and guidebook.  Patient learns different ways to obtain key vitamins and minerals, including through a recommended healthy diet. It is important to discuss all supplements you take with your doctor.   Healthy Mind-Set    Smoking Cessation  Clinical staff conducted group or individual video education with verbal and written material and guidebook.  Patient learns that cigarette smoking and tobacco addiction pose a serious health risk which affects millions of people. Stopping smoking will significantly reduce the risk of heart disease, lung disease, and many forms of cancer. Recommended strategies for quitting are covered, including working with your doctor to develop a successful plan.  Culinary   Becoming a Set designer conducted group or individual video education with verbal and written material and guidebook.  Patient learns that cooking at home can be healthy, cost-effective, quick, and puts them in control. Keys to cooking healthy recipes will include looking at your recipe, assessing your equipment needs, planning ahead, making it simple, choosing cost-effective seasonal ingredients, and limiting the use of added fats, salts, and sugars.  Cooking - Breakfast and Snacks  Clinical staff conducted group or individual video education with verbal and written material and guidebook.  Patient learns how important breakfast is to satiety and nutrition through the entire day. Recommendations include key foods to eat during breakfast to help stabilize blood sugar levels and to prevent overeating at meals later in the day.  Planning ahead is also a key component.  Cooking - Educational psychologist conducted group or individual video education with verbal and written material and guidebook.  Patient learns eating strategies to improve overall  health, including an approach to cook more at home. Recommendations include thinking of animal protein as a side on your plate rather than center stage and focusing instead on lower calorie dense options like vegetables, fruits, whole grains, and plant-based proteins, such as beans. Making sauces in large quantities to freeze for later and leaving the skin on your vegetables are also recommended to maximize your experience.  Cooking - Healthy Salads and Dressing Clinical staff conducted group or individual video education with verbal and written material and guidebook.  Patient learns that vegetables, fruits, whole grains, and legumes are the foundations of the Pritikin Eating Plan. Recommendations include how to incorporate each of these in flavorful and healthy salads, and how to create homemade salad dressings. Proper handling of ingredients is also covered. Cooking - Soups and [employer] - Soups and Desserts Clinical staff conducted group or individual video education with verbal and written material and guidebook.  Patient learns that Pritikin soups and desserts make for easy, nutritious, and delicious snacks and meal components that are low in sodium, fat, sugar, and calorie density, while high in vitamins, minerals, and filling fiber. Recommendations include simple and healthy ideas for soups and desserts.   Overview     The Pritikin Solution Program Overview Clinical staff conducted group or individual video education with verbal and written material and guidebook.  Patient learns that the results of the Pritikin Program have been documented in more than 100 articles published in peer-reviewed journals, and the benefits include reducing risk factors for  (and, in some cases, even reversing) high cholesterol, high blood pressure, type 2 diabetes, obesity, and more! An overview of the three key pillars of the Pritikin Program will be covered: eating well, doing regular exercise, and having a healthy mind-set.  WORKSHOPS  Exercise: Exercise Basics: Building Your Action Plan Clinical staff led group instruction and group discussion with PowerPoint presentation and patient guidebook. To enhance the learning environment the use of posters, models and videos may be added. At the conclusion of this workshop, patients will comprehend the difference between physical activity and exercise, as well as the benefits of incorporating both, into their routine. Patients will understand the FITT (Frequency, Intensity, Time, and Type) principle and how to use it to build an exercise action plan. In addition, safety concerns and other considerations for exercise and cardiac rehab will be addressed by the presenter. The purpose of this lesson is to promote a comprehensive and effective weekly exercise routine in order to improve patients' overall level of fitness.   Managing Heart Disease: Your Path to a Healthier Heart Clinical staff led group instruction and group discussion with PowerPoint presentation and patient guidebook. To enhance the learning environment the use of posters, models and videos may be added.At the conclusion of this workshop, patients will understand the anatomy and physiology of the heart. Additionally, they will understand how Pritikin's three pillars impact the risk factors, the progression, and the management of heart disease.  The purpose of this lesson is to provide a high-level overview of the heart, heart disease, and how the Pritikin lifestyle positively impacts risk factors.  Exercise Biomechanics Clinical staff led group instruction and group discussion with PowerPoint presentation and patient guidebook. To enhance the learning  environment the use of posters, models and videos may be added. Patients will learn how the structural parts of their bodies function and how these functions impact their daily activities, movement, and exercise. Patients will learn how to promote a neutral spine,  learn how to manage pain, and identify ways to improve their physical movement in order to promote healthy living. The purpose of this lesson is to expose patients to common physical limitations that impact physical activity. Participants will learn practical ways to adapt and manage aches and pains, and to minimize their effect on regular exercise. Patients will learn how to maintain good posture while sitting, walking, and lifting.  Balance Training and Fall Prevention  Clinical staff led group instruction and group discussion with PowerPoint presentation and patient guidebook. To enhance the learning environment the use of posters, models and videos may be added. At the conclusion of this workshop, patients will understand the importance of their sensorimotor skills (vision, proprioception, and the vestibular system) in maintaining their ability to balance as they age. Patients will apply a variety of balancing exercises that are appropriate for their current level of function. Patients will understand the common causes for poor balance, possible solutions to these problems, and ways to modify their physical environment in order to minimize their fall risk. The purpose of this lesson is to teach patients about the importance of maintaining balance as they age and ways to minimize their risk of falling.  WORKSHOPS   Nutrition:  Fueling a Ship broker led group instruction and group discussion with PowerPoint presentation and patient guidebook. To enhance the learning environment the use of posters, models and videos may be added. Patients will review the foundational principles of the Pritikin Eating Plan and understand  what constitutes a serving size in each of the food groups. Patients will also learn Pritikin-friendly foods that are better choices when away from home and review make-ahead meal and snack options. Calorie density will be reviewed and applied to three nutrition priorities: weight maintenance, weight loss, and weight gain. The purpose of this lesson is to reinforce (in a group setting) the key concepts around what patients are recommended to eat and how to apply these guidelines when away from home by planning and selecting Pritikin-friendly options. Patients will understand how calorie density may be adjusted for different weight management goals.  Mindful Eating  Clinical staff led group instruction and group discussion with PowerPoint presentation and patient guidebook. To enhance the learning environment the use of posters, models and videos may be added. Patients will briefly review the concepts of the Pritikin Eating Plan and the importance of low-calorie dense foods. The concept of mindful eating will be introduced as well as the importance of paying attention to internal hunger signals. Triggers for non-hunger eating and techniques for dealing with triggers will be explored. The purpose of this lesson is to provide patients with the opportunity to review the basic principles of the Pritikin Eating Plan, discuss the value of eating mindfully and how to measure internal cues of hunger and fullness using the Hunger Scale. Patients will also discuss reasons for non-hunger eating and learn strategies to use for controlling emotional eating.  Targeting Your Nutrition Priorities Clinical staff led group instruction and group discussion with PowerPoint presentation and patient guidebook. To enhance the learning environment the use of posters, models and videos may be added. Patients will learn how to determine their genetic susceptibility to disease by reviewing their family history. Patients will gain  insight into the importance of diet as part of an overall healthy lifestyle in mitigating the impact of genetics and other environmental insults. The purpose of this lesson is to provide patients with the opportunity to assess their personal nutrition priorities by  looking at their family history, their own health history and current risk factors. Patients will also be able to discuss ways of prioritizing and modifying the [facility] Eating Plan for their highest risk areas  Menu  Clinical staff led group instruction and group discussion with PowerPoint presentation and patient guidebook. To enhance the learning environment the use of posters, models and videos may be added. Using menus brought in from [employer], or printed from [employer], patients will apply the Pritikin dining out guidelines that were presented in the [employer] video. Patients will also be able to practice these guidelines in a variety of provided scenarios. The purpose of this lesson is to provide patients with the opportunity to practice hands-on learning of the [facility] Dining Out guidelines with actual menus and practice scenarios.  Label Reading Clinical staff led group instruction and group discussion with PowerPoint presentation and patient guidebook. To enhance the learning environment the use of posters, models and videos may be added. Patients will review and discuss the [facility]in label reading guidelines presented in [employer]'s Label Reading Educational series video. Using fool labels brought in from local grocery stores and markets, patients will apply the label reading guidelines and determine if the packaged food meet the Pritikin guidelines. The purpose of this lesson is to provide patients with the opportunity to review, discuss, and practice hands-on learning of the [facility] Label Reading guidelines with actual packaged food labels. Cooking School  [facility]'s [employer] are  designed to teach patients ways to prepare quick, simple, and affordable recipes at home. The importance of nutrition's role in chronic disease risk reduction is reflected in its emphasis in the overall [facility]in program. By learning how to prepare essential core P[facility]in Eating Plan recipes, patients will increase control over what they eat; be able to customize the flavor of foods without the use of added salt, sugar, or fat; and improve the quality of the food they consume. By learning a set of core recipes which are easily assembled, quickly prepared, and affordable, patients are more likely to prepare more healthy foods at home. These workshops focus on convenient breakfasts, simple entres, side dishes, and desserts which can be prepared with minimal effort and are consistent with nutrition recommendations for cardiovascular risk reduction. Cooking [employer] are taught by a Armed forces logistics/support/administrative officer (RD) who has been trained by the [employer]. The chef or RD has a clear understanding of the importance of minimizing - if not completely eliminating - added fat, sugar, and sodium in recipes. Throughout the series of Cooking School Workshop sessions, patients will learn about healthy ingredients and efficient methods of cooking to build confidence in their capability to prepare    Cooking School weekly topics:  Adding Flavor- Sodium-Free  Fast and Healthy Breakfasts  Powerhouse Plant-Based Proteins  Satisfying Salads and Dressings  Simple Sides and Sauces  International Cuisine-Spotlight on the [employer] Zones  Delicious Desserts  Savory Soups  [employer] - Meals in a Astronomer Appetizers and Snacks  Comforting Weekend Breakfasts  One-Pot Wonders   Fast Evening Meals  Landscape architect Your Pritikin Plate  WORKSHOPS   Healthy Mindset (Psychosocial):  Focused Goals, Sustainable Changes Clinical staff led group instruction and group discussion  with PowerPoint presentation and patient guidebook. To enhance the learning environment the use of posters, models and videos may be added. Patients will be able to apply effective goal setting strategies to establish at least one personal goal,  and then take consistent, meaningful action toward that goal. They will learn to identify common barriers to achieving personal goals and develop strategies to overcome them. Patients will also gain an understanding of how our mind-set can impact our ability to achieve goals and the importance of cultivating a positive and growth-oriented mind-set. The purpose of this lesson is to provide patients with a deeper understanding of how to set and achieve personal goals, as well as the tools and strategies needed to overcome common obstacles which may arise along the way.  From Head to Heart: The Power of a Healthy Outlook  Clinical staff led group instruction and group discussion with PowerPoint presentation and patient guidebook. To enhance the learning environment the use of posters, models and videos may be added. Patients will be able to recognize and describe the impact of emotions and mood on physical health. They will discover the importance of self-care and explore self-care practices which may work for them. Patients will also learn how to utilize the 4 C's to cultivate a healthier outlook and better manage stress and challenges. The purpose of this lesson is to demonstrate to patients how a healthy outlook is an essential part of maintaining good health, especially as they continue their cardiac rehab journey.  Healthy Sleep for a Healthy Heart Clinical staff led group instruction and group discussion with PowerPoint presentation and patient guidebook. To enhance the learning environment the use of posters, models and videos may be added. At the conclusion of this workshop, patients will be able to demonstrate knowledge of the importance of sleep to overall  health, well-being, and quality of life. They will understand the symptoms of, and treatments for, common sleep disorders. Patients will also be able to identify daytime and nighttime behaviors which impact sleep, and they will be able to apply these tools to help manage sleep-related challenges. The purpose of this lesson is to provide patients with a general overview of sleep and outline the importance of quality sleep. Patients will learn about a few of the most common sleep disorders. Patients will also be introduced to the concept of "sleep hygiene," and discover ways to self-manage certain sleeping problems through simple daily behavior changes. Finally, the workshop will motivate patients by clarifying the links between quality sleep and their goals of heart-healthy living.   Recognizing and Reducing Stress Clinical staff led group instruction and group discussion with PowerPoint presentation and patient guidebook. To enhance the learning environment the use of posters, models and videos may be added. At the conclusion of this workshop, patients will be able to understand the types of stress reactions, differentiate between acute and chronic stress, and recognize the impact that chronic stress has on their health. They will also be able to apply different coping mechanisms, such as reframing negative self-talk. Patients will have the opportunity to practice a variety of stress management techniques, such as deep abdominal breathing, progressive muscle relaxation, and/or guided imagery.  The purpose of this lesson is to educate patients on the role of stress in their lives and to provide healthy techniques for coping with it.  Learning Barriers/Preferences:  Learning Barriers/Preferences - 01/29/23 1328       Learning Barriers/Preferences   Learning Barriers Sight   Decreased vision needs cataract surgery, uses a cane has issues with chronic pain lower back and both knees   Learning Preferences None    No learning preferences were indicated            Education Topics:  Knowledge Questionnaire Score:  Knowledge Questionnaire Score - 01/29/23 1242       Knowledge Questionnaire Score   Pre Score 17/24             Core Components/Risk Factors/Patient Goals at Admission:  Personal Goals and Risk Factors at Admission - 01/29/23 1242       Core Components/Risk Factors/Patient Goals on Admission    Weight Management Yes;Obesity;Weight Loss    Intervention Weight Management/Obesity: Establish reasonable short term and long term weight goals.;Obesity: Provide education and appropriate resources to help participant work on and attain dietary goals.    Admit Weight 173 lb 4.5 oz (78.6 kg)    Expected Outcomes Short Term: Continue to assess and modify interventions until short term weight is achieved;Long Term: Adherence to nutrition and physical activity/exercise program aimed toward attainment of established weight goal;Weight Loss: Understanding of general recommendations for a balanced deficit meal plan, which promotes 1-2 lb weight loss per week and includes a negative energy balance of [id number] kcal/d    Diabetes Yes    Intervention Provide education about signs/symptoms and action to take for hypo/hyperglycemia.;Provide education about proper nutrition, including hydration, and aerobic/resistive exercise prescription along with prescribed medications to achieve blood glucose in normal ranges: Fasting glucose 65-99 mg/dL    Expected Outcomes Short Term: Participant verbalizes understanding of the signs/symptoms and immediate care of hyper/hypoglycemia, proper foot care and importance of medication, aerobic/resistive exercise and nutrition plan for blood glucose control.;Long Term: Attainment of HbA1C < 7%.    Hypertension Yes    Intervention Provide education on lifestyle modifcations including regular physical activity/exercise, weight management, moderate sodium restriction and  increased consumption of fresh fruit, vegetables, and low fat dairy, alcohol moderation, and smoking cessation.;Monitor prescription use compliance.    Expected Outcomes Short Term: Continued assessment and intervention until BP is < 140/42mm HG in hypertensive participants. < 130/46mm HG in hypertensive participants with diabetes, heart failure or chronic kidney disease.;Long Term: Maintenance of blood pressure at goal levels.    Lipids Yes    Intervention Provide education and support for participant on nutrition & aerobic/resistive exercise along with prescribed medications to achieve LDL 70mg , HDL >40mg .    Expected Outcomes Short Term: Participant states understanding of desired cholesterol values and is compliant with medications prescribed. Participant is following exercise prescription and nutrition guidelines.;Long Term: Cholesterol controlled with medications as prescribed, with individualized exercise RX and with personalized nutrition plan. Value goals: LDL < 70mg , HDL > 40 mg.    Stress Yes    Intervention Offer individual and/or small group education and counseling on adjustment to heart disease, stress management and health-related lifestyle change. Teach and support self-help strategies.;Refer participants experiencing significant psychosocial distress to appropriate mental health specialists for further evaluation and treatment. When possible, include family members and significant others in education/counseling sessions.    Expected Outcomes Short Term: Participant demonstrates changes in health-related behavior, relaxation and other stress management skills, ability to obtain effective social support, and compliance with psychotropic medications if prescribed.;Long Term: Emotional wellbeing is indicated by absence of clinically significant psychosocial distress or social isolation.             Core Components/Risk Factors/Patient Goals Review:   Goals and Risk Factor Review      Row Name 02/04/23 1713 02/14/23 1408           Core Components/Risk Factors/Patient Goals Review   Personal Goals Review Weight Management/Obesity;Hypertension;Lipids;Diabetes;Stress Weight Management/Obesity;Hypertension;Lipids;Diabetes;Stress      Review Angel Reynolds started intensive  cardiac rehab on 02/04/23 and did fair with exercise for her fitness level. Vital signs and CBG's were stable Angel Reynolds had chest pain on 02/13/23 walking into intensive cardiac rehab. Exercise held. 12 lead obtained per onsite provider. Dr Lowella Curb office was notified.      Expected Outcomes Angel Reynolds will continue to participate in intensive cardiac rehab for exercise, nutrition and lifestyle modifications Angel Reynolds will continue to participate in intensive cardiac rehab for exercise, nutrition and lifestyle modifications               Core Components/Risk Factors/Patient Goals at Discharge (Final Review):   Goals and Risk Factor Review - 02/14/23 1408       Core Components/Risk Factors/Patient Goals Review   Personal Goals Review Weight Management/Obesity;Hypertension;Lipids;Diabetes;Stress    Review Angel Reynolds had chest pain on 02/13/23 walking into intensive cardiac rehab. Exercise held. 12 lead obtained per onsite provider. Dr Lowella Curb office was notified.    Expected Outcomes Angel Reynolds will continue to participate in intensive cardiac rehab for exercise, nutrition and lifestyle modifications             ITP Comments:  ITP Comments     Row Name 01/29/23 1159 02/04/23 1709         ITP Comments Introduction to [facility] Education Program/Intensive Cardiac Rehab. Initial Orientation packet Reviewed with the patient 30 Day ITP Review. Pola started intensive cardiac rehab on 02/04/23. Matasha did fair with exercise as she is deconditioned.               Comments: See ITP Comments

## 2023-02-18 NOTE — Progress Notes (Signed)
Angel Reynolds reported that she felt like her CBG was low. Pre exercise CBG 141 this morning at home. Patient checked her CBG by her meter it was 65.  Patient was given orange juice. CBG 40 by hospital meter. Patient was given 2 glucose gels and a caffeine free cola. Recheck CBG 78. Patient says she had peanut butter crackers this morning. Medications reviewed. Onsite provider Jari Favre [facility] notified. Dr Marquette Saa Lee's office was called and notified. Office staff will consult with Dr Nedra Hai and call the patient if any medication adjustments are made.Exit CBG 174. Patient's husband who drives Angel Reynolds to class. Angel Reynolds left cardiac rehab without complaints.Will continue to monitor the patient throughout  the program.Angel Reynolds Mila Palmer RN BSN

## 2023-02-20 ENCOUNTER — Encounter (HOSPITAL_COMMUNITY)
Admission: RE | Admit: 2023-02-20 | Discharge: 2023-02-20 | Disposition: A | Discharge status: Home / Self Care | Payer: Medicare HMO | Source: Ambulatory Visit | Attending: Cardiology | Admitting: Cardiology

## 2023-02-20 DIAGNOSIS — Z955 Presence of coronary angioplasty implant and graft: Secondary | ICD-10-CM

## 2023-02-20 NOTE — Progress Notes (Signed)
QUALITY OF LIFE SCORE REVIEW  Pt completed Quality of Life survey as a participant in Cardiac Rehab.  Scores 21.0 or below are considered low.  Pt score very low in several areas Overall 19.03, Health and Function 19.1, socioeconomic 18.33, physiological and spiritual 16.79, family 22.8. Patient quality of life slightly altered by physical constraints which limits ability to perform as prior to recent cardiac illness.  Angel Reynolds reports being dissatisfied with her health due to her recent cardiac events and other preexisting health conditions .  Offered emotional support and reassurance.  Will continue to monitor and intervene as necessary. Angel Reynolds denies being depressed at this time. Will continue to monitor the patient throughout  the program.Estaban [place] Mila Palmer RN BSN

## 2023-02-21 NOTE — Progress Notes (Signed)
Mahnoor said that her her primary care provider Dr Nedra Hai decreased her glimepiride to 4 mg a day. Will continue to monitor the patient throughout  the program. Thayer Headings RN BSN

## 2023-02-22 ENCOUNTER — Encounter (HOSPITAL_COMMUNITY)
Admission: RE | Admit: 2023-02-22 | Discharge: 2023-02-22 | Disposition: A | Discharge status: Home / Self Care | Payer: Medicare HMO | Source: Ambulatory Visit | Attending: Cardiology | Admitting: Cardiology

## 2023-02-22 DIAGNOSIS — Z955 Presence of coronary angioplasty implant and graft: Secondary | ICD-10-CM

## 2023-02-25 ENCOUNTER — Encounter (HOSPITAL_COMMUNITY)
Admission: RE | Admit: 2023-02-25 | Discharge: 2023-02-25 | Disposition: A | Discharge status: Home / Self Care | Payer: Medicare HMO | Source: Ambulatory Visit | Attending: Cardiology | Admitting: Cardiology

## 2023-02-25 DIAGNOSIS — Z955 Presence of coronary angioplasty implant and graft: Secondary | ICD-10-CM | POA: Diagnosis not present

## 2023-02-26 ENCOUNTER — Other Ambulatory Visit: Payer: Self-pay | Admitting: Cardiology

## 2023-02-27 ENCOUNTER — Encounter (HOSPITAL_COMMUNITY)
Admission: RE | Admit: 2023-02-27 | Discharge: 2023-02-27 | Disposition: A | Discharge status: Home / Self Care | Payer: Medicare HMO | Source: Ambulatory Visit | Attending: Cardiology | Admitting: Cardiology

## 2023-02-27 DIAGNOSIS — Z955 Presence of coronary angioplasty implant and graft: Secondary | ICD-10-CM

## 2023-03-01 ENCOUNTER — Encounter (HOSPITAL_COMMUNITY): Payer: Medicare HMO

## 2023-03-04 ENCOUNTER — Encounter (HOSPITAL_COMMUNITY)
Admission: RE | Admit: 2023-03-04 | Discharge: 2023-03-04 | Disposition: A | Discharge status: Home / Self Care | Payer: Medicare HMO | Source: Ambulatory Visit | Attending: Cardiology | Admitting: Cardiology

## 2023-03-04 DIAGNOSIS — Z955 Presence of coronary angioplasty implant and graft: Secondary | ICD-10-CM | POA: Diagnosis not present

## 2023-03-06 ENCOUNTER — Encounter (HOSPITAL_COMMUNITY)
Admission: RE | Admit: 2023-03-06 | Discharge: 2023-03-06 | Disposition: A | Discharge status: Home / Self Care | Payer: Medicare HMO | Source: Ambulatory Visit | Attending: Cardiology | Admitting: Cardiology

## 2023-03-06 DIAGNOSIS — R079 Chest pain, unspecified: Secondary | ICD-10-CM | POA: Insufficient documentation

## 2023-03-06 DIAGNOSIS — Z955 Presence of coronary angioplasty implant and graft: Secondary | ICD-10-CM | POA: Diagnosis present

## 2023-03-06 DIAGNOSIS — E162 Hypoglycemia, unspecified: Secondary | ICD-10-CM | POA: Diagnosis present

## 2023-03-08 ENCOUNTER — Encounter (HOSPITAL_COMMUNITY)
Admission: RE | Admit: 2023-03-08 | Discharge: 2023-03-08 | Disposition: A | Discharge status: Home / Self Care | Payer: Medicare HMO | Source: Ambulatory Visit | Attending: Cardiology | Admitting: Cardiology

## 2023-03-08 DIAGNOSIS — R079 Chest pain, unspecified: Secondary | ICD-10-CM

## 2023-03-08 DIAGNOSIS — Z955 Presence of coronary angioplasty implant and graft: Secondary | ICD-10-CM

## 2023-03-08 DIAGNOSIS — E162 Hypoglycemia, unspecified: Secondary | ICD-10-CM

## 2023-03-11 ENCOUNTER — Encounter (HOSPITAL_COMMUNITY)
Admission: RE | Admit: 2023-03-11 | Discharge: 2023-03-11 | Disposition: A | Discharge status: Home / Self Care | Payer: Medicare HMO | Source: Ambulatory Visit | Attending: Cardiology | Admitting: Cardiology

## 2023-03-11 DIAGNOSIS — Z955 Presence of coronary angioplasty implant and graft: Secondary | ICD-10-CM

## 2023-03-11 NOTE — Progress Notes (Signed)
Cardiac Individual Treatment Plan  Patient Details  Name: Angel Reynolds MRN: [id number] Date of Birth: 12/23/1955 Referring Provider:   Flowsheet Row INTENSIVE CARDIAC REHAB ORIENT from 01/29/2023 in [facility]  Referring Provider Georgeanna Lea, MD       Initial Encounter Date:  Flowsheet Row INTENSIVE CARDIAC REHAB ORIENT from 01/29/2023 in [facility]  Date 01/29/23       Visit Diagnosis: 12/11/22 S/P DES Mid LAD  Patient's Home Medications on Admission:  Current Outpatient Medications:    aspirin 81 MG tablet, Take 81 mg by mouth every evening., Disp: , Rfl:    atorvastatin (LIPITOR) 80 MG tablet, Take 80 mg by mouth every evening., Disp: , Rfl:    Cholecalciferol 25 MCG (1000 UT) capsule, Take 1,000 Units by mouth in the morning., Disp: , Rfl:    clopidogrel (PLAVIX) 75 MG tablet, Take 1 tablet (75 mg total) by mouth daily., Disp: 30 tablet, Rfl: 6   clotrimazole-betamethasone (LOTRISONE) cream, Apply 1 application. topically daily as needed (Eczema)., Disp: , Rfl:    escitalopram (LEXAPRO) 10 MG tablet, Take 10 mg by mouth every evening., Disp: , Rfl:    famotidine (PEPCID) 20 MG tablet, Take 20 mg by mouth every 12 (twelve) hours as needed for heartburn or indigestion., Disp: , Rfl:    glimepiride (AMARYL) 4 MG tablet, Take 4 mg by mouth daily with breakfast., Disp: , Rfl:    HYDROcodone-acetaminophen (NORCO/VICODIN) 5-325 MG tablet, Take 1 tablet by mouth every 8 (eight) hours as needed for pain., Disp: , Rfl:    isosorbide mononitrate (IMDUR) 120 MG 24 hr tablet, Take 1 tablet (120 mg total) by mouth daily., Disp: 90 tablet, Rfl: 3   lisinopril (ZESTRIL) 10 MG tablet, Take 1 tablet by mouth once daily, Disp: 90 tablet, Rfl: 0   Menthol, Topical Analgesic, (ICY HOT) 5 % PTCH, Place 1 patch onto the skin daily as needed (pain.)., Disp: , Rfl:    metFORMIN  (GLUCOPHAGE) 850 MG tablet, Take 850 mg by mouth 3 (three) times daily., Disp: , Rfl:    metoprolol tartrate (LOPRESSOR) 25 MG tablet, Take 1 tablet by mouth twice daily, Disp: 180 tablet, Rfl: 3   nitroGLYCERIN (NITROSTAT) 0.4 MG SL tablet, Place 1 tablet (0.4 mg total) under the tongue every 5 (five) minutes as needed for chest pain., Disp: 35 tablet, Rfl: 2   OZEMPIC, 1 MG/DOSE, 4 MG/3ML SOPN, Inject 1 mg into the skin once a week. (Patient not taking: Reported on 01/29/2023), Disp: , Rfl:    traMADol (ULTRAM) 50 MG tablet, Take 50 mg by mouth 3 (three) times daily as needed for pain., Disp: , Rfl:   Past Medical History: Past Medical History:  Diagnosis Date   Anxiety    Benign essential hypertension    Chronic constipation    Coronary artery disease    Diabetes mellitus    AODM   Elevated liver enzymes    Hypercholesteremia    Hyperlipidemia    Lower back pain    Obesity    Osteoarthritis of left knee 01/06/2021   Peripheral neuropathy    Recurrent major depressive disorder in partial remission (HCC)    Type 2 diabetes mellitus with obesity (HCC)     Tobacco Use: Social History   Tobacco Use  Smoking Status Former   Types: E-cigarettes, Cigarettes  Smokeless Tobacco Never  Tobacco Comments  Stopped vaping in 2023    Labs: Review Flowsheet       Latest Ref Rng & Units 06/03/2008 03/19/2022 07/25/2022  Labs for ITP Cardiac and Pulmonary Rehab  Cholestrol 100 - 199 mg/dL - - 409   LDL (calc) 0 - 99 mg/dL - - 71   HDL-C >81 mg/dL - - 41   Trlycerides 0 - 149 mg/dL - - 191   Hemoglobin Y7W 4.8 - 5.6 % - 8.2  -  TCO2 - 29  - -    Capillary Blood Glucose: Lab Results  Component Value Date   GLUCAP 174 (H) 02/18/2023   GLUCAP 78 02/18/2023   GLUCAP 40 (LL) 02/18/2023   GLUCAP 120 (H) 02/08/2023   GLUCAP 164 (H) 02/08/2023     Exercise Target Goals: Exercise Program Goal: Individual exercise prescription set using results from initial 6 min walk test and THRR  while considering  patient's activity barriers and safety.   Exercise Prescription Goal: Initial exercise prescription builds to 30-45 minutes a day of aerobic activity, 2-3 days per week.  Home exercise guidelines will be given to patient during program as part of exercise prescription that the participant will acknowledge.  Activity Barriers & Risk Stratification:  Activity Barriers & Cardiac Risk Stratification - 01/29/23 1135       Activity Barriers & Cardiac Risk Stratification   Activity Barriers Arthritis;Back Problems;Joint Problems;Balance Concerns;Assistive Device;Other (comment)    Comments Chronic low back pain, DJD;  osteoarthritis in both knees, bone-on-bone.    Cardiac Risk Stratification Moderate             6 Minute Walk:  6 Minute Walk     Row Name 01/29/23 1237         6 Minute Walk   Phase Initial     Distance 472 feet     Walk Time 4.85 minutes     # of Rest Breaks 1  Seated for 1.15 minutes     MPH 1.1     METS 1.52     RPE 12     Perceived Dyspnea  0     VO2 Peak 5.31     Symptoms Yes (comment)     Comments Low back, bilateral knee pain-chronic, 6/10 on pain scale.     Resting HR 70 bpm     Resting BP 128/58     Resting Oxygen Saturation  98 %     Exercise Oxygen Saturation  during 6 min walk 97 %     Max Ex. HR 93 bpm     Max Ex. BP 166/62     2 Minute Post BP 122/58              Oxygen Initial Assessment:   Oxygen Re-Evaluation:   Oxygen Discharge (Final Oxygen Re-Evaluation):   Initial Exercise Prescription:  Initial Exercise Prescription - 01/29/23 1200       Date of Initial Exercise RX and Referring Provider   Date 01/29/23    Referring Provider Georgeanna Lea, MD    Expected Discharge Date 04/12/23      NuStep   Level 1    SPM 70    Minutes 20    METs 1.5      Prescription Details   Frequency (times per week) 3    Duration Progress to 30 minutes of continuous aerobic without signs/symptoms of physical  distress      Intensity   THRR 40-80% of Max Heartrate 61-122  Ratings of Perceived Exertion 11-13    Perceived Dyspnea 0-4      Progression   Progression Continue to progress workloads to maintain intensity without signs/symptoms of physical distress.      Resistance Training   Training Prescription Yes    Weight 2 lbs    Reps 10-15             Perform Capillary Blood Glucose checks as needed.  Exercise Prescription Changes:   Exercise Prescription Changes     Row Name 02/04/23 1028 02/18/23 1031 03/04/23 1030         Response to Exercise   Blood Pressure (Admit) 128/52 128/60 118/60     Blood Pressure (Exercise) 132/54 124/68 142/64     Blood Pressure (Exit) 110/52 136/54 120/58     Heart Rate (Admit) 65 bpm 70 bpm 69 bpm     Heart Rate (Exercise) 83 bpm 88 bpm 93 bpm     Heart Rate (Exit) 71 bpm 65 bpm 69 bpm     Rating of Perceived Exertion (Exercise) 12 11 10      Symptoms None Hypoglycemic after exercise. None     Comments Off to a good start with exercise. Low blood sugar, no weights today. --     Duration Progress to 30 minutes of  aerobic without signs/symptoms of physical distress Progress to 30 minutes of  aerobic without signs/symptoms of physical distress Progress to 30 minutes of  aerobic without signs/symptoms of physical distress     Intensity THRR unchanged THRR unchanged THRR unchanged       Progression   Progression Continue to progress workloads to maintain intensity without signs/symptoms of physical distress. Continue to progress workloads to maintain intensity without signs/symptoms of physical distress. Continue to progress workloads to maintain intensity without signs/symptoms of physical distress.     Average METs 1.2 1.4 1.6       Resistance Training   Training Prescription Yes No Yes     Weight 2 lbs -- 2 lbs     Reps 10-15 -- 10-15     Time 10 Minutes -- 10 Minutes       Interval Training   Interval Training No No No       NuStep    Level 1 1 1      SPM 38 58 70     Minutes 25 25 25      METs 1.2 1.4 1.6              Exercise Comments:   Exercise Comments     Row Name 02/04/23 1130 02/06/23 1100 02/18/23 1053 03/04/23 1101     Exercise Comments Patient tolerated low intensity exercise well without symptoms. Oriented patient to warm-up/cool-down stretches seated in chair. Reviewed METs with patient. Reviewed goals with patient. Reviewed METs with patient.             Exercise Goals and Review:   Exercise Goals     Row Name 01/29/23 1135             Exercise Goals   Increase Physical Activity Yes       Intervention Provide advice, education, support and counseling about physical activity/exercise needs.;Develop an individualized exercise prescription for aerobic and resistive training based on initial evaluation findings, risk stratification, comorbidities and participant's personal goals.       Expected Outcomes Short Term: Attend rehab on a regular basis to increase amount of physical activity.;Long Term: Add in home exercise to make exercise  part of routine and to increase amount of physical activity.;Long Term: Exercising regularly at least 3-5 days a week.       Increase Strength and Stamina Yes       Intervention Provide advice, education, support and counseling about physical activity/exercise needs.;Develop an individualized exercise prescription for aerobic and resistive training based on initial evaluation findings, risk stratification, comorbidities and participant's personal goals.       Expected Outcomes Short Term: Increase workloads from initial exercise prescription for resistance, speed, and METs.;Short Term: Perform resistance training exercises routinely during rehab and add in resistance training at home;Long Term: Improve cardiorespiratory fitness, muscular endurance and strength as measured by increased METs and functional capacity ( )       Able to understand and use rate of  perceived exertion (RPE) scale Yes       Intervention Provide education and explanation on how to use RPE scale       Expected Outcomes Short Term: Able to use RPE daily in rehab to express subjective intensity level;Long Term:  Able to use RPE to guide intensity level when exercising independently       Knowledge and understanding of Target Heart Rate Range (THRR) Yes       Intervention Provide education and explanation of THRR including how the numbers were predicted and where they are located for reference       Expected Outcomes Short Term: Able to state/look up THRR;Long Term: Able to use THRR to govern intensity when exercising independently;Short Term: Able to use daily as guideline for intensity in rehab       Able to check pulse independently Yes       Intervention Provide education and demonstration on how to check pulse in carotid and radial arteries.;Review the importance of being able to check your own pulse for safety during independent exercise       Expected Outcomes Short Term: Able to explain why pulse checking is important during independent exercise;Long Term: Able to check pulse independently and accurately       Understanding of Exercise Prescription Yes       Intervention Provide education, explanation, and written materials on patient's individual exercise prescription       Expected Outcomes Short Term: Able to explain program exercise prescription;Long Term: Able to explain home exercise prescription to exercise independently                Exercise Goals Re-Evaluation :  Exercise Goals Re-Evaluation     Row Name 02/04/23 1130 02/18/23 1053           Exercise Goal Re-Evaluation   Exercise Goals Review Increase Physical Activity;Increase Strength and Stamina;Able to understand and use rate of perceived exertion (RPE) scale Increase Physical Activity;Increase Strength and Stamina;Able to understand and use rate of perceived exertion (RPE) scale      Comments  Patient able to understand and use RPE scale appropriately. Patient is limited by knee pain. Per patient both knees bone on bone, and she needs knee replacement. Her goal is to be able to get around better. Patient is walking her dog and has a Slim cycle that she's not currently using but can incorporate into her routine.      Expected Outcomes Progress workloads as tolerated to help increase strength and stamina. Continue to progress workloads as tolerated. Add 1 -2 days walking at home, taking rest breaks as tolerated.  Discharge Exercise Prescription (Final Exercise Prescription Changes):  Exercise Prescription Changes - 03/04/23 1030       Response to Exercise   Blood Pressure (Admit) 118/60    Blood Pressure (Exercise) 142/64    Blood Pressure (Exit) 120/58    Heart Rate (Admit) 69 bpm    Heart Rate (Exercise) 93 bpm    Heart Rate (Exit) 69 bpm    Rating of Perceived Exertion (Exercise) 10    Symptoms None    Duration Progress to 30 minutes of  aerobic without signs/symptoms of physical distress    Intensity THRR unchanged      Progression   Progression Continue to progress workloads to maintain intensity without signs/symptoms of physical distress.    Average METs 1.6      Resistance Training   Training Prescription Yes    Weight 2 lbs    Reps 10-15    Time 10 Minutes      Interval Training   Interval Training No      NuStep   Level 1    SPM 70    Minutes 25    METs 1.6             Nutrition:  Target Goals: Understanding of nutrition guidelines, daily intake of sodium 1500mg , cholesterol 200mg , calories 30% from fat and 7% or less from saturated fats, daily to have 5 or more servings of fruits and vegetables.  Biometrics:  Pre Biometrics - 01/29/23 1157       Pre Biometrics   Waist Circumference 44.5 inches    Hip Circumference 46.25 inches    Waist to Hip Ratio 0.96 %    Triceps Skinfold 22 mm    % Body Fat 42.8 %    Grip Strength  16 kg    Flexibility --   Not performed, chronic back pain.   Single Leg Stand 0.87 seconds              Nutrition Therapy Plan and Nutrition Goals:  Nutrition Therapy & Goals - 03/06/23 0922       Nutrition Therapy   Diet Heart healthy/carbohydrate consistent diet    Drug/Food Interactions Statins/Certain Fruits      Personal Nutrition Goals   Nutrition Goal Patient to identify strategies for reducing cardiovascular risk by attending the [employer] education and nutrition series weekly.    Personal Goal #2 Patient to improve diet quality by using the plate method as a guide for meal planning to include lean protein/plant protein, fruits, vegetables, whole grains, nonfat dairy as part of a well-balanced diet.    Personal Goal #3 Patient to reduce sodium to 1500mg  per day    Comments Gioia remains pre-contemplative toward dietary changes/lifestyle changes. She reports being a "picky" eater and reports very little fruit/vegetable intake. Her husband does a lot of the cooking and is trying to make heart healthy choices. Daniesha will benefit from participation in intensive cardiac rehab for nutrition, exercise, and lifestyle modification.      Intervention Plan   Intervention Prescribe, educate and counsel regarding individualized specific dietary modifications aiming towards targeted core components such as weight, hypertension, lipid management, diabetes, heart failure and other comorbidities.;Nutrition handout(s) given to patient.    Expected Outcomes Short Term Goal: Understand basic principles of dietary content, such as calories, fat, sodium, cholesterol and nutrients.;Long Term Goal: Adherence to prescribed nutrition plan.             Nutrition Assessments:  MEDIFICTS Score Key: ?70 Need to  make dietary changes  40-70 Heart Healthy Diet ? 40 Therapeutic Level Cholesterol Diet   Flowsheet Row INTENSIVE CARDIAC REHAB from 02/04/2023 in [facility]  Picture Your Plate Total Score on Discharge 56      Picture Your Plate Scores: <16 Unhealthy dietary pattern with much room for improvement. 41-50 Dietary pattern unlikely to meet recommendations for good health and room for improvement. 51-60 More healthful dietary pattern, with some room for improvement.  >60 Healthy dietary pattern, although there may be some specific behaviors that could be improved.    Nutrition Goals Re-Evaluation:  Nutrition Goals Re-Evaluation     Row Name 02/04/23 1134 03/06/23 0922           Goals   Current Weight 174 lb 9.7 oz (79.2 kg) 174 lb 13.2 oz (79.3 kg)      Comment lipoprotein A 342.6, A1c 8.2, lipids WNL, B12 low (107) No new labs; most recent labs lipoprotein A 342.6, A1c 8.2, lipids WNL, B12 low (107)      Expected Outcome Corneisha reports pre-contemplation toward dietary changes/lifestyle changes. She reports being a "picky" eater and reports eating very few fruits and vegetables. Her husband does a lot of the cooking and is trying to make heart healthy choices. Ben will benefit from participation in intensive cardiac rehab for nutrition, exercise, and lifestyle modification. Delores remains pre-contemplative toward dietary changes/lifestyle changes. She reports being a "picky" eater and reports very little fruit/vegetable intake. Her husband does a lot of the cooking and is trying to make heart healthy choices. Casundra will benefit from participation in intensive cardiac rehab for nutrition, exercise, and lifestyle modification.               Nutrition Goals Re-Evaluation:  Nutrition Goals Re-Evaluation     Row Name 02/04/23 1134 03/06/23 0922           Goals   Current Weight 174 lb 9.7 oz (79.2 kg) 174 lb 13.2 oz (79.3 kg)      Comment lipoprotein A 342.6, A1c 8.2, lipids WNL, B12 low (107) No new labs; most recent labs lipoprotein A 342.6, A1c 8.2, lipids WNL, B12 low (107)      Expected Outcome Marvyl reports  pre-contemplation toward dietary changes/lifestyle changes. She reports being a "picky" eater and reports eating very few fruits and vegetables. Her husband does a lot of the cooking and is trying to make heart healthy choices. Khanh will benefit from participation in intensive cardiac rehab for nutrition, exercise, and lifestyle modification. Yuleimy remains pre-contemplative toward dietary changes/lifestyle changes. She reports being a "picky" eater and reports very little fruit/vegetable intake. Her husband does a lot of the cooking and is trying to make heart healthy choices. Marlana will benefit from participation in intensive cardiac rehab for nutrition, exercise, and lifestyle modification.               Nutrition Goals Discharge (Final Nutrition Goals Re-Evaluation):  Nutrition Goals Re-Evaluation - 03/06/23 1096       Goals   Current Weight 174 lb 13.2 oz (79.3 kg)    Comment No new labs; most recent labs lipoprotein A 342.6, A1c 8.2, lipids WNL, B12 low (107)    Expected Outcome Raedean remains pre-contemplative toward dietary changes/lifestyle changes. She reports being a "picky" eater and reports very little fruit/vegetable intake. Her husband does a lot of the cooking and is trying to make heart healthy choices. Shaterica will benefit from  participation in intensive cardiac rehab for nutrition, exercise, and lifestyle modification.             Psychosocial: Target Goals: Acknowledge presence or absence of significant depression and/or stress, maximize coping skills, provide positive support system. Participant is able to verbalize types and ability to use techniques and skills needed for reducing stress and depression.  Initial Review & Psychosocial Screening:  Initial Psych Review & Screening - 01/29/23 1202       Initial Review   Current issues with Current Stress Concerns;History of Depression;Current Depression    Source of Stress Concerns Chronic Illness;Family   Has chronic pain  lower back and bilateral knee pain   Comments Lucielle's daughter needs a heart and lung transplant. Bryauna has chronic lower back and knee pain      Family Dynamics   Good Support System? Yes   Armelle has her husband, daughter and 2 sisiters for support     Barriers   Psychosocial barriers to participate in program There are no identifiable barriers or psychosocial needs.      Screening Interventions   Interventions Encouraged to exercise    Expected Outcomes Short Term goal: Utilizing psychosocial counselor, staff and physician to assist with identification of specific Stressors or current issues interfering with healing process. Setting desired goal for each stressor or current issue identified.;Long Term Goal: Stressors or current issues are controlled or eliminated.;Short Term goal: Identification and review with participant of any Quality of Life or Depression concerns found by scoring the questionnaire.             Quality of Life Scores:  Quality of Life - 01/29/23 1241       Quality of Life   Select Quality of Life      Quality of Life Scores   Health/Function Pre 19.1 %    Socioeconomic Pre 18.33 %    Psych/Spiritual Pre 16.79 %    Family Pre 22.8 %    GLOBAL Pre 19.03 %            Scores of 19 and below usually indicate a poorer quality of life in these areas.  A difference of  2-3 points is a clinically meaningful difference.  A difference of 2-3 points in the total score of the Quality of Life Index has been associated with significant improvement in overall quality of life, self-image, physical symptoms, and general health in studies assessing change in quality of life.  PHQ-9: Review Flowsheet       01/29/2023  Depression screen PHQ 2/9  Decreased Interest 1  Down, Depressed, Hopeless 0  PHQ - 2 Score 1  Altered sleeping 2  Tired, decreased energy 3  Change in appetite 2  Feeling bad or failure about yourself  0  Trouble concentrating 1  Moving slowly or  fidgety/restless 0  Suicidal thoughts 0  PHQ-9 Score 9  Difficult doing work/chores Somewhat difficult   Interpretation of Total Score  Total Score Depression Severity:  1-4 = Minimal depression, 5-9 = Mild depression, 10-14 = Moderate depression, 15-19 = Moderately severe depression, 20-27 = Severe depression   Psychosocial Evaluation and Intervention:   Psychosocial Re-Evaluation:  Psychosocial Re-Evaluation     Row Name 02/04/23 1710 02/14/23 1405 03/12/23 1446         Psychosocial Re-Evaluation   Current issues with Current Stress Concerns;History of Depression Current Stress Concerns;History of Depression Current Stress Concerns;History of Depression     Comments Will review quality of life questionnaire  in the upcoming week Will review quality of life questionnaire in the near future. Yasamine does have health and family stressors. Quality of life reviewed on 02/20/23. Flo denies being depressed. Florastine says her energy level is improving since since she has participating in the program     Expected Outcomes Wilena will have decreased or controlled stressors and controlled depression upon completion of intensive cardiac rehab. Makalia will have decreased or controlled stressors and controlled depression upon completion of intensive cardiac rehab. Eloina will have decreased or controlled stressors and controlled depression upon completion of intensive cardiac rehab.     Interventions Encouraged to attend Cardiac Rehabilitation for the exercise;Stress management education;Encouraged to attend Pulmonary Rehabilitation for the exercise Encouraged to attend Cardiac Rehabilitation for the exercise;Stress management education;Encouraged to attend Pulmonary Rehabilitation for the exercise Encouraged to attend Cardiac Rehabilitation for the exercise;Stress management education;Encouraged to attend Pulmonary Rehabilitation for the exercise     Continue Psychosocial Services  Follow up required by staff Follow  up required by staff Follow up required by staff       Initial Review   Source of Stress Concerns Family;Chronic Illness;Unable to participate in former interests or hobbies;Unable to perform yard/household activities Family;Chronic Illness;Unable to participate in former interests or hobbies;Unable to perform yard/household activities Family;Chronic Illness;Unable to participate in former interests or hobbies;Unable to perform yard/household activities     Comments Will continue to monitor and offer support as needed Will continue to monitor and offer support as needed Will continue to monitor and offer support as needed              Psychosocial Discharge (Final Psychosocial Re-Evaluation):  Psychosocial Re-Evaluation - 03/12/23 1446       Psychosocial Re-Evaluation   Current issues with Current Stress Concerns;History of Depression    Comments Quality of life reviewed on 02/20/23. Bresha denies being depressed. Levonda says her energy level is improving since since she has participating in the program    Expected Outcomes Wyona will have decreased or controlled stressors and controlled depression upon completion of intensive cardiac rehab.    Interventions Encouraged to attend Cardiac Rehabilitation for the exercise;Stress management education;Encouraged to attend Pulmonary Rehabilitation for the exercise    Continue Psychosocial Services  Follow up required by staff      Initial Review   Source of Stress Concerns Family;Chronic Illness;Unable to participate in former interests or hobbies;Unable to perform yard/household activities    Comments Will continue to monitor and offer support as needed             Vocational Rehabilitation: Provide vocational rehab assistance to qualifying candidates.   Vocational Rehab Evaluation & Intervention:  Vocational Rehab - 01/29/23 1229       Initial Vocational Rehab Evaluation & Intervention   Assessment shows need for Vocational  Rehabilitation No   Zahaira is retired and does not need vocational rehab at this time            Education: Education Goals: Education classes will be provided on a weekly basis, covering required topics. Participant will state understanding/return demonstration of topics presented.    Education     Row Name 02/04/23 1300     Education   Cardiac Education Topics Pritikin   Geographical information systems officer Exercise   Exercise Workshop Exercise Basics: Diplomatic Services operational officer   Instruction Review Code 1- Verbalizes Understanding   Class Start Time 1150  Class Stop Time 1235   Class Time Calculation (min) 45 min    Row Name 02/11/23 1400     Education   Cardiac Education Topics Pritikin   Select Core Videos     Core Videos   Educator Dietitian   Select Nutrition   Nutrition Nutrition Action Plan   Instruction Review Code 1- Verbalizes Understanding   Class Start Time 1145   Class Stop Time 1219   Class Time Calculation (min) 34 min    Row Name 02/15/23 1400     Education   Cardiac Education Topics Pritikin   Psychologist, forensic Exercise Education   Exercise Education Move It!   Instruction Review Code 1- Verbalizes Understanding   Class Start Time 1150   Class Stop Time 1230   Class Time Calculation (min) 40 min    Row Name 02/20/23 1300     Education   Cardiac Education Topics Pritikin   Fish farm manager   Weekly Topic One-Pot Wonders   Instruction Review Code 1- Verbalizes Understanding   Class Start Time 1140   Class Stop Time 1212   Class Time Calculation (min) 32 min    Row Name 03/06/23 1200     Education   Cardiac Education Topics Pritikin   Designer, multimedia   Weekly Topic Fast Evening Meals   Instruction Review  Code 1- Verbalizes Understanding   Class Start Time 1140   Class Stop Time 1215   Class Time Calculation (min) 35 min    Row Name 03/08/23 1000     Education   Cardiac Education Topics --   Select --     Core Videos   Educator --   Select --   Nutrition --   Instruction Review Code --            Core Videos: Exercise    Move It!  Clinical staff conducted group or individual video education with verbal and written material and guidebook.  Patient learns the recommended Pritikin exercise program. Exercise with the goal of living a long, healthy life. Some of the health benefits of exercise include controlled diabetes, healthier blood pressure levels, improved cholesterol levels, improved heart and lung capacity, improved sleep, and better body composition. Everyone should speak with their doctor before starting or changing an exercise routine.  Biomechanical Limitations Clinical staff conducted group or individual video education with verbal and written material and guidebook.  Patient learns how biomechanical limitations can impact exercise and how we can mitigate and possibly overcome limitations to have an impactful and balanced exercise routine.  Body Composition Clinical staff conducted group or individual video education with verbal and written material and guidebook.  Patient learns that body composition (ratio of muscle mass to fat mass) is a key component to assessing overall fitness, rather than body weight alone. Increased fat mass, especially visceral belly fat, can put [place] at increased risk for metabolic syndrome, type 2 diabetes, heart disease, and even death. It is recommended to combine diet and exercise (cardiovascular and resistance training) to improve your body composition. Seek guidance from your physician and exercise physiologist before implementing an exercise routine.  Exercise Action Plan Clinical staff conducted group or individual video education with  verbal and written  material and guidebook.  Patient learns the recommended strategies to achieve and enjoy long-term exercise adherence, including variety, self-motivation, self-efficacy, and positive decision making. Benefits of exercise include fitness, good health, weight management, more energy, better sleep, less stress, and overall well-being.  Medical   Heart Disease Risk Reduction Clinical staff conducted group or individual video education with verbal and written material and guidebook.  Patient learns our heart is our most vital organ as it circulates oxygen, nutrients, white blood cells, and hormones throughout the entire body, and carries waste away. Data supports a plant-based eating plan like the Pritikin Program for its effectiveness in slowing progression of and reversing heart disease. The video provides a number of recommendations to address heart disease.   Metabolic Syndrome and Belly Fat  Clinical staff conducted group or individual video education with verbal and written material and guidebook.  Patient learns what metabolic syndrome is, how it leads to heart disease, and how one can reverse it and keep it from coming back. You have metabolic syndrome if you have 3 of the following 5 criteria: abdominal obesity, high blood pressure, high triglycerides, low HDL cholesterol, and high blood sugar.  Hypertension and Heart Disease Clinical staff conducted group or individual video education with verbal and written material and guidebook.  Patient learns that high blood pressure, or hypertension, is very common in the [place]. Hypertension is largely due to excessive salt intake, but other important risk factors include being overweight, physical inactivity, drinking too much alcohol, smoking, and not eating enough potassium from fruits and vegetables. High blood pressure is a leading risk factor for heart attack, stroke, congestive heart failure, dementia, kidney failure, and  premature death. Long-term effects of excessive salt intake include stiffening of the arteries and thickening of heart muscle and organ damage. Recommendations include ways to reduce hypertension and the risk of heart disease.  Diseases of Our Time - Focusing on Diabetes Clinical staff conducted group or individual video education with verbal and written material and guidebook.  Patient learns why the best way to stop diseases of our time is prevention, through food and other lifestyle changes. Medicine (such as prescription pills and surgeries) is often only a Band-Aid on the problem, not a long-term solution. Most common diseases of our time include obesity, type 2 diabetes, hypertension, heart disease, and cancer. The Pritikin Program is recommended and has been proven to help reduce, reverse, and/or prevent the damaging effects of metabolic syndrome.  Nutrition   Overview of the Pritikin Eating Plan  Clinical staff conducted group or individual video education with verbal and written material and guidebook.  Patient learns about the Pritikin Eating Plan for disease risk reduction. The Pritikin Eating Plan emphasizes a wide variety of unrefined, minimally-processed carbohydrates, like fruits, vegetables, whole grains, and legumes. Go, Caution, and Stop food choices are explained. Plant-based and lean animal proteins are emphasized. Rationale provided for low sodium intake for blood pressure control, low added sugars for blood sugar stabilization, and low added fats and oils for coronary artery disease risk reduction and weight management.  Calorie Density  Clinical staff conducted group or individual video education with verbal and written material and guidebook.  Patient learns about calorie density and how it impacts the Pritikin Eating Plan. Knowing the characteristics of the food you choose will help you decide whether those foods will lead to weight gain or weight loss, and whether you want to  consume more or less of them. Weight loss is usually a side  effect of the Pritikin Eating Plan because of its focus on low calorie-dense foods.  Label Reading  Clinical staff conducted group or individual video education with verbal and written material and guidebook.  Patient learns about the Pritikin recommended label reading guidelines and corresponding recommendations regarding calorie density, added sugars, sodium content, and whole grains.  Dining Out - Part 1  Clinical staff conducted group or individual video education with verbal and written material and guidebook.  Patient learns that restaurant meals can be sabotaging because they can be so high in calories, fat, sodium, and/or sugar. Patient learns recommended strategies on how to positively address this and avoid unhealthy pitfalls.  Facts on Fats  Clinical staff conducted group or individual video education with verbal and written material and guidebook.  Patient learns that lifestyle modifications can be just as effective, if not more so, as many medications for lowering your risk of heart disease. A Pritikin lifestyle can help to reduce your risk of inflammation and atherosclerosis (cholesterol build-up, or plaque, in the artery walls). Lifestyle interventions such as dietary choices and physical activity address the cause of atherosclerosis. A review of the types of fats and their impact on blood cholesterol levels, along with dietary recommendations to reduce fat intake is also included.  Nutrition Action Plan  Clinical staff conducted group or individual video education with verbal and written material and guidebook.  Patient learns how to incorporate Pritikin recommendations into their lifestyle. Recommendations include planning and keeping personal health goals in mind as an important part of their success.  Healthy Mind-Set    Healthy Minds, Bodies, Hearts  Clinical staff conducted group or individual video education with  verbal and written material and guidebook.  Patient learns how to identify when they are stressed. Video will discuss the impact of that stress, as well as the many benefits of stress management. Patient will also be introduced to stress management techniques. The way we think, act, and feel has an impact on our hearts.  How Our Thoughts Can Heal Our Hearts  Clinical staff conducted group or individual video education with verbal and written material and guidebook.  Patient learns that negative thoughts can cause depression and anxiety. This can result in negative lifestyle behavior and serious health problems. Cognitive behavioral therapy is an effective method to help control our thoughts in order to change and improve our emotional outlook.  Additional Videos:  Exercise    Improving Performance  Clinical staff conducted group or individual video education with verbal and written material and guidebook.  Patient learns to use a non-linear approach by alternating intensity levels and lengths of time spent exercising to help burn more calories and lose more body fat. Cardiovascular exercise helps improve heart health, metabolism, hormonal balance, blood sugar control, and recovery from fatigue. Resistance training improves strength, endurance, balance, coordination, reaction time, metabolism, and muscle mass. Flexibility exercise improves circulation, posture, and balance. Seek guidance from your physician and exercise physiologist before implementing an exercise routine and learn your capabilities and proper form for all exercise.  Introduction to Yoga  Clinical staff conducted group or individual video education with verbal and written material and guidebook.  Patient learns about yoga, a discipline of the coming together of mind, breath, and body. The benefits of yoga include improved flexibility, improved range of motion, better posture and core strength, increased lung function, weight loss, and  positive self-image. Yoga's heart health benefits include lowered blood pressure, healthier heart rate, decreased cholesterol and triglyceride levels, improved  immune function, and reduced stress. Seek guidance from your physician and exercise physiologist before implementing an exercise routine and learn your capabilities and proper form for all exercise.  Medical   Aging: Enhancing Your Quality of Life  Clinical staff conducted group or individual video education with verbal and written material and guidebook.  Patient learns key strategies and recommendations to stay in good physical health and enhance quality of life, such as prevention strategies, having an advocate, securing a Health Care Proxy and Power of Attorney, and keeping a list of medications and system for tracking them. It also discusses how to avoid risk for bone loss.  Biology of Weight Control  Clinical staff conducted group or individual video education with verbal and written material and guidebook.  Patient learns that weight gain occurs because we consume more calories than we burn (eating more, moving less). Even if your body weight is normal, you may have higher ratios of fat compared to muscle mass. Too much body fat puts you at increased risk for cardiovascular disease, heart attack, stroke, type 2 diabetes, and obesity-related cancers. In addition to exercise, following the Pritikin Eating Plan can help reduce your risk.  Decoding Lab Results  Clinical staff conducted group or individual video education with verbal and written material and guidebook.  Patient learns that lab test reflects one measurement whose values change over time and are influenced by many factors, including medication, stress, sleep, exercise, food, hydration, pre-existing medical conditions, and more. It is recommended to use the knowledge from this video to become more involved with your lab results and evaluate your numbers to speak with your  doctor.   Diseases of Our Time - Overview  Clinical staff conducted group or individual video education with verbal and written material and guidebook.  Patient learns that according to the CDC, 50% to 70% of chronic diseases (such as obesity, type 2 diabetes, elevated lipids, hypertension, and heart disease) are avoidable through lifestyle improvements including healthier food choices, listening to satiety cues, and increased physical activity.  Sleep Disorders Clinical staff conducted group or individual video education with verbal and written material and guidebook.  Patient learns how good quality and duration of sleep are important to overall health and well-being. Patient also learns about sleep disorders and how they impact health along with recommendations to address them, including discussing with a physician.  Nutrition  Dining Out - Part 2 Clinical staff conducted group or individual video education with verbal and written material and guidebook.  Patient learns how to plan ahead and communicate in order to maximize their dining experience in a healthy and nutritious manner. Included are recommended food choices based on the type of restaurant the patient is visiting.   Fueling a Banker conducted group or individual video education with verbal and written material and guidebook.  There is a strong connection between our food choices and our health. Diseases like obesity and type 2 diabetes are very prevalent and are in large-part due to lifestyle choices. The Pritikin Eating Plan provides plenty of food and hunger-curbing satisfaction. It is easy to follow, affordable, and helps reduce health risks.  Menu Workshop  Clinical staff conducted group or individual video education with verbal and written material and guidebook.  Patient learns that restaurant meals can sabotage health goals because they are often packed with calories, fat, sodium, and sugar.  Recommendations include strategies to plan ahead and to communicate with the manager, chef, or server to help order  a healthier meal.  Planning Your Eating Strategy  Clinical staff conducted group or individual video education with verbal and written material and guidebook.  Patient learns about the Pritikin Eating Plan and its benefit of reducing the risk of disease. The Pritikin Eating Plan does not focus on calories. Instead, it emphasizes high-quality, nutrient-rich foods. By knowing the characteristics of the foods, we choose, we can determine their calorie density and make informed decisions.  Targeting Your Nutrition Priorities  Clinical staff conducted group or individual video education with verbal and written material and guidebook.  Patient learns that lifestyle habits have a tremendous impact on disease risk and progression. This video provides eating and physical activity recommendations based on your personal health goals, such as reducing LDL cholesterol, losing weight, preventing or controlling type 2 diabetes, and reducing high blood pressure.  Vitamins and Minerals  Clinical staff conducted group or individual video education with verbal and written material and guidebook.  Patient learns different ways to obtain key vitamins and minerals, including through a recommended healthy diet. It is important to discuss all supplements you take with your doctor.   Healthy Mind-Set    Smoking Cessation  Clinical staff conducted group or individual video education with verbal and written material and guidebook.  Patient learns that cigarette smoking and tobacco addiction pose a serious health risk which affects millions of people. Stopping smoking will significantly reduce the risk of heart disease, lung disease, and many forms of cancer. Recommended strategies for quitting are covered, including working with your doctor to develop a successful plan.  Culinary   Becoming a Corporate investment banker conducted group or individual video education with verbal and written material and guidebook.  Patient learns that cooking at home can be healthy, cost-effective, quick, and puts them in control. Keys to cooking healthy recipes will include looking at your recipe, assessing your equipment needs, planning ahead, making it simple, choosing cost-effective seasonal ingredients, and limiting the use of added fats, salts, and sugars.  Cooking - Breakfast and Snacks  Clinical staff conducted group or individual video education with verbal and written material and guidebook.  Patient learns how important breakfast is to satiety and nutrition through the entire day. Recommendations include key foods to eat during breakfast to help stabilize blood sugar levels and to prevent overeating at meals later in the day. Planning ahead is also a key component.  Cooking - Educational psychologist conducted group or individual video education with verbal and written material and guidebook.  Patient learns eating strategies to improve overall health, including an approach to cook more at home. Recommendations include thinking of animal protein as a side on your plate rather than center stage and focusing instead on lower calorie dense options like vegetables, fruits, whole grains, and plant-based proteins, such as beans. Making sauces in large quantities to freeze for later and leaving the skin on your vegetables are also recommended to maximize your experience.  Cooking - Healthy Salads and Dressing Clinical staff conducted group or individual video education with verbal and written material and guidebook.  Patient learns that vegetables, fruits, whole grains, and legumes are the foundations of the Pritikin Eating Plan. Recommendations include how to incorporate each of these in flavorful and healthy salads, and how to create homemade salad dressings. Proper handling of ingredients is also covered.  Cooking - Soups and [employer] - Soups and Desserts Clinical staff conducted group or individual video education with verbal and  written material and guidebook.  Patient learns that Pritikin soups and desserts make for easy, nutritious, and delicious snacks and meal components that are low in sodium, fat, sugar, and calorie density, while high in vitamins, minerals, and filling fiber. Recommendations include simple and healthy ideas for soups and desserts.   Overview     The Pritikin Solution Program Overview Clinical staff conducted group or individual video education with verbal and written material and guidebook.  Patient learns that the results of the Pritikin Program have been documented in more than 100 articles published in peer-reviewed journals, and the benefits include reducing risk factors for (and, in some cases, even reversing) high cholesterol, high blood pressure, type 2 diabetes, obesity, and more! An overview of the three key pillars of the Pritikin Program will be covered: eating well, doing regular exercise, and having a healthy mind-set.  WORKSHOPS  Exercise: Exercise Basics: Building Your Action Plan Clinical staff led group instruction and group discussion with PowerPoint presentation and patient guidebook. To enhance the learning environment the use of posters, models and videos may be added. At the conclusion of this workshop, patients will comprehend the difference between physical activity and exercise, as well as the benefits of incorporating both, into their routine. Patients will understand the FITT (Frequency, Intensity, Time, and Type) principle and how to use it to build an exercise action plan. In addition, safety concerns and other considerations for exercise and cardiac rehab will be addressed by the presenter. The purpose of this lesson is to promote a comprehensive and effective weekly exercise routine in order to improve patients' overall level of  fitness.   Managing Heart Disease: Your Path to a Healthier Heart Clinical staff led group instruction and group discussion with PowerPoint presentation and patient guidebook. To enhance the learning environment the use of posters, models and videos may be added.At the conclusion of this workshop, patients will understand the anatomy and physiology of the heart. Additionally, they will understand how Pritikin's three pillars impact the risk factors, the progression, and the management of heart disease.  The purpose of this lesson is to provide a high-level overview of the heart, heart disease, and how the Pritikin lifestyle positively impacts risk factors.  Exercise Biomechanics Clinical staff led group instruction and group discussion with PowerPoint presentation and patient guidebook. To enhance the learning environment the use of posters, models and videos may be added. Patients will learn how the structural parts of their bodies function and how these functions impact their daily activities, movement, and exercise. Patients will learn how to promote a neutral spine, learn how to manage pain, and identify ways to improve their physical movement in order to promote healthy living. The purpose of this lesson is to expose patients to common physical limitations that impact physical activity. Participants will learn practical ways to adapt and manage aches and pains, and to minimize their effect on regular exercise. Patients will learn how to maintain good posture while sitting, walking, and lifting.  Balance Training and Fall Prevention  Clinical staff led group instruction and group discussion with PowerPoint presentation and patient guidebook. To enhance the learning environment the use of posters, models and videos may be added. At the conclusion of this workshop, patients will understand the importance of their sensorimotor skills (vision, proprioception, and the vestibular system)  in maintaining their ability to balance as they age. Patients will apply a variety of balancing exercises that are appropriate for their current level of function. Patients will understand the  common causes for poor balance, possible solutions to these problems, and ways to modify their physical environment in order to minimize their fall risk. The purpose of this lesson is to teach patients about the importance of maintaining balance as they age and ways to minimize their risk of falling.  WORKSHOPS   Nutrition:  Fueling a Ship broker led group instruction and group discussion with PowerPoint presentation and patient guidebook. To enhance the learning environment the use of posters, models and videos may be added. Patients will review the foundational principles of the Pritikin Eating Plan and understand what constitutes a serving size in each of the food groups. Patients will also learn Pritikin-friendly foods that are better choices when away from home and review make-ahead meal and snack options. Calorie density will be reviewed and applied to three nutrition priorities: weight maintenance, weight loss, and weight gain. The purpose of this lesson is to reinforce (in a group setting) the key concepts around what patients are recommended to eat and how to apply these guidelines when away from home by planning and selecting Pritikin-friendly options. Patients will understand how calorie density may be adjusted for different weight management goals.  Mindful Eating  Clinical staff led group instruction and group discussion with PowerPoint presentation and patient guidebook. To enhance the learning environment the use of posters, models and videos may be added. Patients will briefly review the concepts of the Pritikin Eating Plan and the importance of low-calorie dense foods. The concept of mindful eating will be introduced as well as the importance of paying attention to internal hunger  signals. Triggers for non-hunger eating and techniques for dealing with triggers will be explored. The purpose of this lesson is to provide patients with the opportunity to review the basic principles of the Pritikin Eating Plan, discuss the value of eating mindfully and how to measure internal cues of hunger and fullness using the Hunger Scale. Patients will also discuss reasons for non-hunger eating and learn strategies to use for controlling emotional eating.  Targeting Your Nutrition Priorities Clinical staff led group instruction and group discussion with PowerPoint presentation and patient guidebook. To enhance the learning environment the use of posters, models and videos may be added. Patients will learn how to determine their genetic susceptibility to disease by reviewing their family history. Patients will gain insight into the importance of diet as part of an overall healthy lifestyle in mitigating the impact of genetics and other environmental insults. The purpose of this lesson is to provide patients with the opportunity to assess their personal nutrition priorities by looking at their family history, their own health history and current risk factors. Patients will also be able to discuss ways of prioritizing and modifying the Pritikin Eating Plan for their highest risk areas  Menu  Clinical staff led group instruction and group discussion with PowerPoint presentation and patient guidebook. To enhance the learning environment the use of posters, models and videos may be added. Using menus brought in from [employer], or printed from [employer], patients will apply the Pritikin dining out guidelines that were presented in the [employer] video. Patients will also be able to practice these guidelines in a variety of provided scenarios. The purpose of this lesson is to provide patients with the opportunity to practice hands-on learning of the Pritikin Dining Out guidelines  with actual menus and practice scenarios.  Label Reading Clinical staff led group instruction and group discussion with PowerPoint presentation and patient guidebook.  To enhance the learning environment the use of posters, models and videos may be added. Patients will review and discuss the Pritikin label reading guidelines presented in [employer]'s Label Reading Educational series video. Using fool labels brought in from local grocery stores and markets, patients will apply the label reading guidelines and determine if the packaged food meet the Pritikin guidelines. The purpose of this lesson is to provide patients with the opportunity to review, discuss, and practice hands-on learning of the Pritikin Label Reading guidelines with actual packaged food labels. Cooking School  P[employer]in's [employer] are designed to teach patients ways to prepare quick, simple, and affordable recipes at home. The importance of nutrition's role in chronic disease risk reduction is reflected in its emphasis in the overall Pritikin program. By learning how to prepare essential core Pritikin Eating Plan recipes, patients will increase control over what they eat; be able to customize the flavor of foods without the use of added salt, sugar, or fat; and improve the quality of the food they consume. By learning a set of core recipes which are easily assembled, quickly prepared, and affordable, patients are more likely to prepare more healthy foods at home. These workshops focus on convenient breakfasts, simple entres, side dishes, and desserts which can be prepared with minimal effort and are consistent with nutrition recommendations for cardiovascular risk reduction. Cooking [employer] are taught by a Armed forces logistics/support/administrative officer (RD) who has been trained by the [employer]. The chef or RD has a clear understanding of the importance of minimizing - if not completely eliminating - added fat, sugar, and  sodium in recipes. Throughout the series of Cooking School Workshop sessions, patients will learn about healthy ingredients and efficient methods of cooking to build confidence in their capability to prepare    Cooking School weekly topics:  Adding Flavor- Sodium-Free  Fast and Healthy Breakfasts  Powerhouse Plant-Based Proteins  Satisfying Salads and Dressings  Simple Sides and Sauces  International Cuisine-Spotlight on the [employer] Zones  Delicious Desserts  Savory Soups  [employer] - Meals in a Astronomer Appetizers and Snacks  Comforting Weekend Breakfasts  One-Pot Wonders   Fast Evening Meals  Landscape architect Your Pritikin Plate  WORKSHOPS   Healthy Mindset (Psychosocial):  Focused Goals, Sustainable Changes Clinical staff led group instruction and group discussion with PowerPoint presentation and patient guidebook. To enhance the learning environment the use of posters, models and videos may be added. Patients will be able to apply effective goal setting strategies to establish at least one personal goal, and then take consistent, meaningful action toward that goal. They will learn to identify common barriers to achieving personal goals and develop strategies to overcome them. Patients will also gain an understanding of how our mind-set can impact our ability to achieve goals and the importance of cultivating a positive and growth-oriented mind-set. The purpose of this lesson is to provide patients with a deeper understanding of how to set and achieve personal goals, as well as the tools and strategies needed to overcome common obstacles which may arise along the way.  From Head to Heart: The Power of a Healthy Outlook  Clinical staff led group instruction and group discussion with PowerPoint presentation and patient guidebook. To enhance the learning environment the use of posters, models and videos may be added. Patients will be able to recognize and  describe the impact of emotions and mood on physical health. They will discover the importance of  self-care and explore self-care practices which may work for them. Patients will also learn how to utilize the 4 C's to cultivate a healthier outlook and better manage stress and challenges. The purpose of this lesson is to demonstrate to patients how a healthy outlook is an essential part of maintaining good health, especially as they continue their cardiac rehab journey.  Healthy Sleep for a Healthy Heart Clinical staff led group instruction and group discussion with PowerPoint presentation and patient guidebook. To enhance the learning environment the use of posters, models and videos may be added. At the conclusion of this workshop, patients will be able to demonstrate knowledge of the importance of sleep to overall health, well-being, and quality of life. They will understand the symptoms of, and treatments for, common sleep disorders. Patients will also be able to identify daytime and nighttime behaviors which impact sleep, and they will be able to apply these tools to help manage sleep-related challenges. The purpose of this lesson is to provide patients with a general overview of sleep and outline the importance of quality sleep. Patients will learn about a few of the most common sleep disorders. Patients will also be introduced to the concept of "sleep hygiene," and discover ways to self-manage certain sleeping problems through simple daily behavior changes. Finally, the workshop will motivate patients by clarifying the links between quality sleep and their goals of heart-healthy living.   Recognizing and Reducing Stress Clinical staff led group instruction and group discussion with PowerPoint presentation and patient guidebook. To enhance the learning environment the use of posters, models and videos may be added. At the conclusion of this workshop, patients will be able to understand the types of stress  reactions, differentiate between acute and chronic stress, and recognize the impact that chronic stress has on their health. They will also be able to apply different coping mechanisms, such as reframing negative self-talk. Patients will have the opportunity to practice a variety of stress management techniques, such as deep abdominal breathing, progressive muscle relaxation, and/or guided imagery.  The purpose of this lesson is to educate patients on the role of stress in their lives and to provide healthy techniques for coping with it.  Learning Barriers/Preferences:  Learning Barriers/Preferences - 01/29/23 1328       Learning Barriers/Preferences   Learning Barriers Sight   Decreased vision needs cataract surgery, uses a cane has issues with chronic pain lower back and both knees   Learning Preferences None   No learning preferences were indicated            Education Topics:  Knowledge Questionnaire Score:  Knowledge Questionnaire Score - 01/29/23 1242       Knowledge Questionnaire Score   Pre Score 17/24             Core Components/Risk Factors/Patient Goals at Admission:  Personal Goals and Risk Factors at Admission - 01/29/23 1242       Core Components/Risk Factors/Patient Goals on Admission    Weight Management Yes;Obesity;Weight Loss    Intervention Weight Management/Obesity: Establish reasonable short term and long term weight goals.;Obesity: Provide education and appropriate resources to help participant work on and attain dietary goals.    Admit Weight 173 lb 4.5 oz (78.6 kg)    Expected Outcomes Short Term: Continue to assess and modify interventions until short term weight is achieved;Long Term: Adherence to nutrition and physical activity/exercise program aimed toward attainment of established weight goal;Weight Loss: Understanding of general recommendations for a balanced deficit meal  plan, which promotes 1-2 lb weight loss per week and includes a negative  energy balance of [phone] kcal/d    Diabetes Yes    Intervention Provide education about signs/symptoms and action to take for hypo/hyperglycemia.;Provide education about proper nutrition, including hydration, and aerobic/resistive exercise prescription along with prescribed medications to achieve blood glucose in normal ranges: Fasting glucose 65-99 mg/dL    Expected Outcomes Short Term: Participant verbalizes understanding of the signs/symptoms and immediate care of hyper/hypoglycemia, proper foot care and importance of medication, aerobic/resistive exercise and nutrition plan for blood glucose control.;Long Term: Attainment of HbA1C < 7%.    Hypertension Yes    Intervention Provide education on lifestyle modifcations including regular physical activity/exercise, weight management, moderate sodium restriction and increased consumption of fresh fruit, vegetables, and low fat dairy, alcohol moderation, and smoking cessation.;Monitor prescription use compliance.    Expected Outcomes Short Term: Continued assessment and intervention until BP is < 140/73mm HG in hypertensive participants. < 130/23mm HG in hypertensive participants with diabetes, heart failure or chronic kidney disease.;Long Term: Maintenance of blood pressure at goal levels.    Lipids Yes    Intervention Provide education and support for participant on nutrition & aerobic/resistive exercise along with prescribed medications to achieve LDL 70mg , HDL >40mg .    Expected Outcomes Short Term: Participant states understanding of desired cholesterol values and is compliant with medications prescribed. Participant is following exercise prescription and nutrition guidelines.;Long Term: Cholesterol controlled with medications as prescribed, with individualized exercise RX and with personalized nutrition plan. Value goals: LDL < 70mg , HDL > 40 mg.    Stress Yes    Intervention Offer individual and/or small group education and counseling on adjustment  to heart disease, stress management and health-related lifestyle change. Teach and support self-help strategies.;Refer participants experiencing significant psychosocial distress to appropriate mental health specialists for further evaluation and treatment. When possible, include family members and significant others in education/counseling sessions.    Expected Outcomes Short Term: Participant demonstrates changes in health-related behavior, relaxation and other stress management skills, ability to obtain effective social support, and compliance with psychotropic medications if prescribed.;Long Term: Emotional wellbeing is indicated by absence of clinically significant psychosocial distress or social isolation.             Core Components/Risk Factors/Patient Goals Review:   Goals and Risk Factor Review     Row Name 02/04/23 1713 02/14/23 1408 03/12/23 1449         Core Components/Risk Factors/Patient Goals Review   Personal Goals Review Weight Management/Obesity;Hypertension;Lipids;Diabetes;Stress Weight Management/Obesity;Hypertension;Lipids;Diabetes;Stress Weight Management/Obesity;Hypertension;Lipids;Diabetes;Stress     Review Daira started intensive cardiac rehab on 02/04/23 and did fair with exercise for her fitness level. Vital signs and CBG's were stable Dasie had chest pain on 02/13/23 walking into intensive cardiac rehab. Exercise held. 12 lead obtained per onsite provider. Dr Lowella Curb office was notified. Lala is doing well with exercise at  intensive cardiac rehab for her fitness level. Willadean has not had any more reports of Angina. CBG's have been stable     Expected Outcomes Alassandra will continue to participate in intensive cardiac rehab for exercise, nutrition and lifestyle modifications Reather will continue to participate in intensive cardiac rehab for exercise, nutrition and lifestyle modifications Janeia will continue to participate in intensive cardiac rehab for exercise, nutrition and  lifestyle modifications              Core Components/Risk Factors/Patient Goals at Discharge (Final Review):   Goals and Risk Factor Review - 03/12/23 1449  Core Components/Risk Factors/Patient Goals Review   Personal Goals Review Weight Management/Obesity;Hypertension;Lipids;Diabetes;Stress    Review Caoilinn is doing well with exercise at  intensive cardiac rehab for her fitness level. Shayleen has not had any more reports of Angina. CBG's have been stable    Expected Outcomes Karenann will continue to participate in intensive cardiac rehab for exercise, nutrition and lifestyle modifications             ITP Comments:  ITP Comments     Row Name 01/29/23 1159 02/04/23 1709 03/12/23 1440       ITP Comments Introduction to Pritikin Education Program/Intensive Cardiac Rehab. Initial Orientation packet Reviewed with the patient 30 Day ITP Review. Gerilynn started intensive cardiac rehab on 02/04/23. Yilin did fair with exercise as she is deconditioned. 30 Day ITP Review. Chelsay has good attendance and participation in  intensive cardiac rehab. Kristalynn is enjoying participating in the program.              Comments: See ITP Comments

## 2023-03-13 ENCOUNTER — Encounter (HOSPITAL_COMMUNITY)
Admission: RE | Admit: 2023-03-13 | Discharge: 2023-03-13 | Disposition: A | Discharge status: Home / Self Care | Payer: Medicare HMO | Source: Ambulatory Visit | Attending: Cardiology | Admitting: Cardiology

## 2023-03-13 DIAGNOSIS — R079 Chest pain, unspecified: Secondary | ICD-10-CM

## 2023-03-13 DIAGNOSIS — E162 Hypoglycemia, unspecified: Secondary | ICD-10-CM

## 2023-03-13 DIAGNOSIS — Z955 Presence of coronary angioplasty implant and graft: Secondary | ICD-10-CM

## 2023-03-13 NOTE — Progress Notes (Signed)
Incomplete Session Note  Patient Details  Name: DEVAN MADRIL MRN: [id number] Date of Birth: 1955/11/20 Referring Provider:   Flowsheet Row INTENSIVE CARDIAC REHAB ORIENT from 01/29/2023 in [facility]  Referring Provider Georgeanna Lea, MD       Theotis Burrow [place]brenner did not complete her rehab session.  Vader reports that her CBG was 34 this morning at home. CBG 128 now. Advised the patient not to exercise today and to contact her PCP about low CBG.Thayer Headings RN BSN

## 2023-03-15 ENCOUNTER — Encounter (HOSPITAL_COMMUNITY)
Admission: RE | Admit: 2023-03-15 | Discharge: 2023-03-15 | Disposition: A | Discharge status: Home / Self Care | Payer: Medicare HMO | Source: Ambulatory Visit | Attending: Cardiology | Admitting: Cardiology

## 2023-03-15 DIAGNOSIS — E162 Hypoglycemia, unspecified: Secondary | ICD-10-CM

## 2023-03-15 DIAGNOSIS — R079 Chest pain, unspecified: Secondary | ICD-10-CM

## 2023-03-15 DIAGNOSIS — Z955 Presence of coronary angioplasty implant and graft: Secondary | ICD-10-CM | POA: Diagnosis not present

## 2023-03-15 NOTE — Progress Notes (Signed)
Reviewed home exercise guidelines with patient including endpoints, temperature precautions, target heart rate and rate of perceived exertion. Patient plans to use her Slim cycle as her mode of home exercise. Walking is limited by back and knee pain. Patient is able to manually count her pulse. Patient voices understanding of instructions given.  Artist Pais, MS, ACSM CEP

## 2023-03-18 ENCOUNTER — Encounter (HOSPITAL_COMMUNITY)
Admission: RE | Admit: 2023-03-18 | Discharge: 2023-03-18 | Disposition: A | Discharge status: Home / Self Care | Payer: Medicare HMO | Source: Ambulatory Visit | Attending: Cardiology | Admitting: Cardiology

## 2023-03-18 DIAGNOSIS — R079 Chest pain, unspecified: Secondary | ICD-10-CM

## 2023-03-18 DIAGNOSIS — E162 Hypoglycemia, unspecified: Secondary | ICD-10-CM

## 2023-03-18 DIAGNOSIS — Z955 Presence of coronary angioplasty implant and graft: Secondary | ICD-10-CM

## 2023-03-20 ENCOUNTER — Encounter (HOSPITAL_COMMUNITY)
Admission: RE | Admit: 2023-03-20 | Discharge: 2023-03-20 | Disposition: A | Discharge status: Home / Self Care | Payer: Medicare HMO | Source: Ambulatory Visit | Attending: Cardiology | Admitting: Cardiology

## 2023-03-20 DIAGNOSIS — Z955 Presence of coronary angioplasty implant and graft: Secondary | ICD-10-CM

## 2023-03-20 DIAGNOSIS — E162 Hypoglycemia, unspecified: Secondary | ICD-10-CM

## 2023-03-20 DIAGNOSIS — R079 Chest pain, unspecified: Secondary | ICD-10-CM

## 2023-03-22 ENCOUNTER — Encounter (HOSPITAL_COMMUNITY)
Admission: RE | Admit: 2023-03-22 | Discharge: 2023-03-22 | Disposition: A | Discharge status: Home / Self Care | Payer: Medicare HMO | Source: Ambulatory Visit | Attending: Cardiology | Admitting: Cardiology

## 2023-03-22 DIAGNOSIS — Z955 Presence of coronary angioplasty implant and graft: Secondary | ICD-10-CM | POA: Diagnosis not present

## 2023-03-22 DIAGNOSIS — R079 Chest pain, unspecified: Secondary | ICD-10-CM

## 2023-03-22 DIAGNOSIS — E162 Hypoglycemia, unspecified: Secondary | ICD-10-CM

## 2023-03-25 ENCOUNTER — Encounter (HOSPITAL_COMMUNITY)
Admission: RE | Admit: 2023-03-25 | Discharge: 2023-03-25 | Disposition: A | Discharge status: Home / Self Care | Payer: Medicare HMO | Source: Ambulatory Visit | Attending: Cardiology | Admitting: Cardiology

## 2023-03-25 DIAGNOSIS — Z955 Presence of coronary angioplasty implant and graft: Secondary | ICD-10-CM

## 2023-03-25 DIAGNOSIS — E162 Hypoglycemia, unspecified: Secondary | ICD-10-CM

## 2023-03-25 DIAGNOSIS — R079 Chest pain, unspecified: Secondary | ICD-10-CM

## 2023-03-27 ENCOUNTER — Encounter (HOSPITAL_COMMUNITY)
Admission: RE | Admit: 2023-03-27 | Discharge: 2023-03-27 | Disposition: A | Discharge status: Home / Self Care | Payer: Medicare HMO | Source: Ambulatory Visit | Attending: Cardiology | Admitting: Cardiology

## 2023-03-27 DIAGNOSIS — R079 Chest pain, unspecified: Secondary | ICD-10-CM

## 2023-03-27 DIAGNOSIS — E162 Hypoglycemia, unspecified: Secondary | ICD-10-CM

## 2023-03-27 DIAGNOSIS — Z955 Presence of coronary angioplasty implant and graft: Secondary | ICD-10-CM

## 2023-03-29 ENCOUNTER — Encounter (HOSPITAL_COMMUNITY): Payer: Medicare HMO

## 2023-04-03 ENCOUNTER — Encounter (HOSPITAL_COMMUNITY)
Admission: RE | Admit: 2023-04-03 | Discharge: 2023-04-03 | Disposition: A | Discharge status: Home / Self Care | Payer: Medicare HMO | Source: Ambulatory Visit | Attending: Cardiology | Admitting: Cardiology

## 2023-04-03 VITALS — Ht 63.25 in | Wt 178.6 lb

## 2023-04-03 DIAGNOSIS — Z955 Presence of coronary angioplasty implant and graft: Secondary | ICD-10-CM | POA: Diagnosis not present

## 2023-04-03 DIAGNOSIS — R079 Chest pain, unspecified: Secondary | ICD-10-CM

## 2023-04-05 ENCOUNTER — Encounter (HOSPITAL_COMMUNITY)
Admission: RE | Admit: 2023-04-05 | Discharge: 2023-04-05 | Disposition: A | Discharge status: Home / Self Care | Payer: Medicare HMO | Source: Ambulatory Visit | Attending: Cardiology | Admitting: Cardiology

## 2023-04-05 DIAGNOSIS — R079 Chest pain, unspecified: Secondary | ICD-10-CM

## 2023-04-05 DIAGNOSIS — Z955 Presence of coronary angioplasty implant and graft: Secondary | ICD-10-CM

## 2023-04-08 ENCOUNTER — Encounter (HOSPITAL_COMMUNITY)
Admission: RE | Admit: 2023-04-08 | Discharge: 2023-04-08 | Disposition: A | Discharge status: Home / Self Care | Payer: Medicare HMO | Source: Ambulatory Visit | Attending: Cardiology | Admitting: Cardiology

## 2023-04-08 DIAGNOSIS — E162 Hypoglycemia, unspecified: Secondary | ICD-10-CM | POA: Diagnosis present

## 2023-04-08 DIAGNOSIS — R079 Chest pain, unspecified: Secondary | ICD-10-CM | POA: Diagnosis present

## 2023-04-08 DIAGNOSIS — Z955 Presence of coronary angioplasty implant and graft: Secondary | ICD-10-CM | POA: Diagnosis present

## 2023-04-08 IMAGING — MR MR HEAD W/O CM
9 of 10 series · 35 of 48 positions shown · non-contrast
Comparison: CT head from the same day.

CLINICAL DATA: Stroke, follow up stroke; Neuro deficit, acute,
stroke suspected

EXAM:
MRI HEAD WITHOUT CONTRAST
MRA HEAD WITHOUT CONTRAST
TECHNIQUE: Multiplanar, multi-echo pulse sequences of the brain and surrounding
structures were acquired without intravenous contrast. Angiographic
images of the Circle of Willis were acquired using MRA technique
without intravenous contrast.

[Series 3: DWI · axial · 3.0mm · 1.09mm/px · z∈[-42,+110]mm · 8 of 104 slices shown (1 of 4)]
[im 1/104]
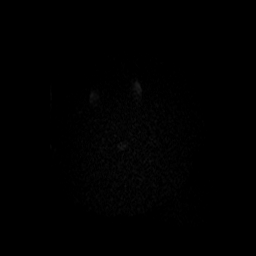
[im 12/104]
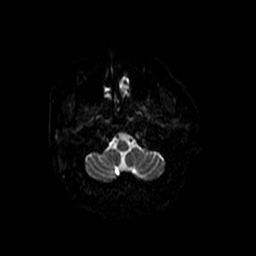
[im 35/104]
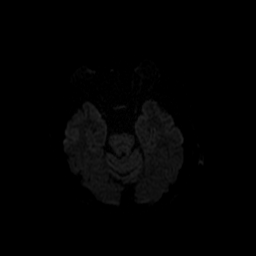
[im 46/104]
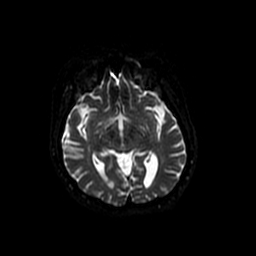
[im 58/104]
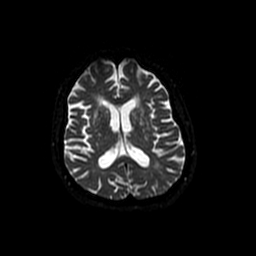
[im 69/104]
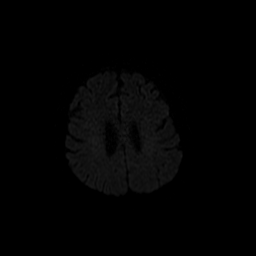
[im 92/104]
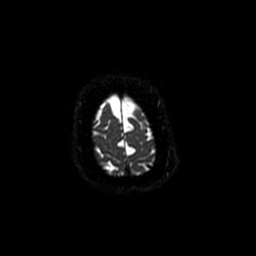
[im 104/104]
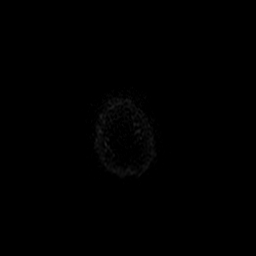

[Series 5: DWI · coronal · 5.0mm · 1.09mm/px · 7 of 66 slices shown (2 of 4)]
[im 1/66]
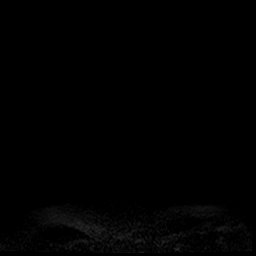
[im 11/66]
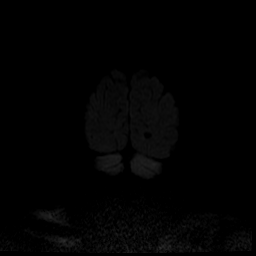
[im 22/66]
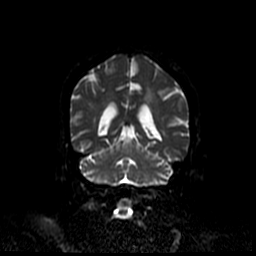
[im 33/66]
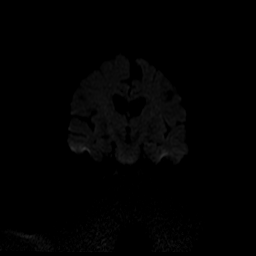
[im 44/66]
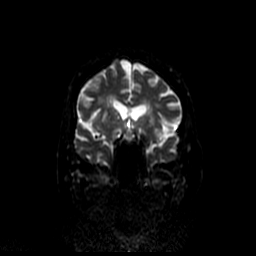
[im 55/66]
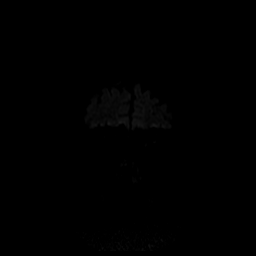
[im 66/66]
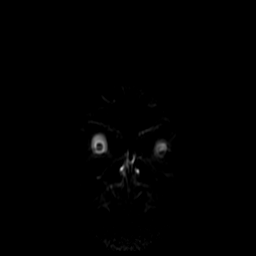

[Series 6: T1 · sagittal · 5.0mm · 0.47mm/px · 2 of 25 slices shown (1 of 2)]
[im 1/25]
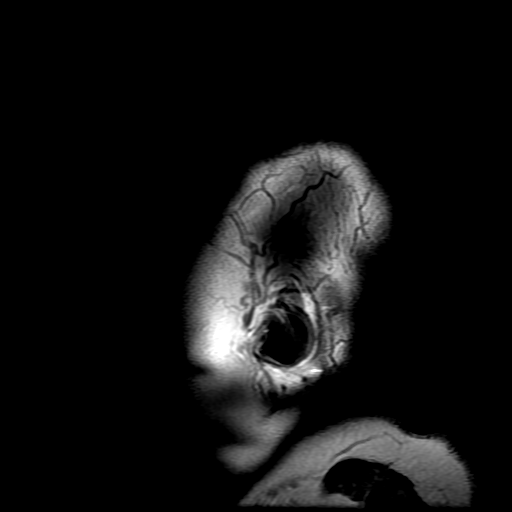
[im 25/25]
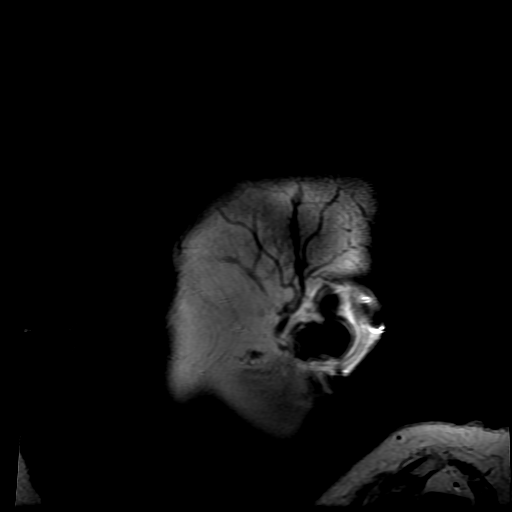

[Series 7: T2 · axial · 5.0mm · 0.43mm/px · z∈[-40,+107]mm · 3 of 26 slices shown (1 of 2)]
[im 1/26]
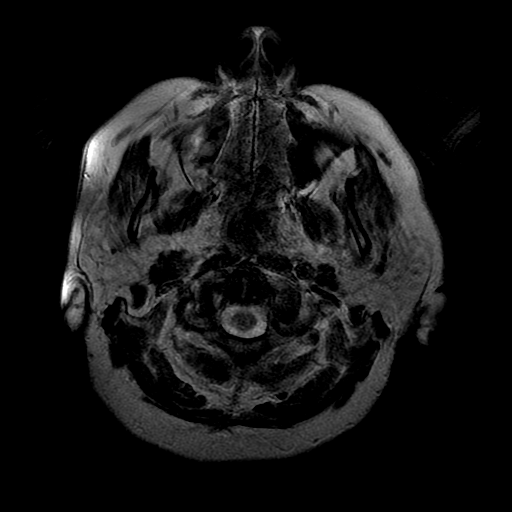
[im 13/26]
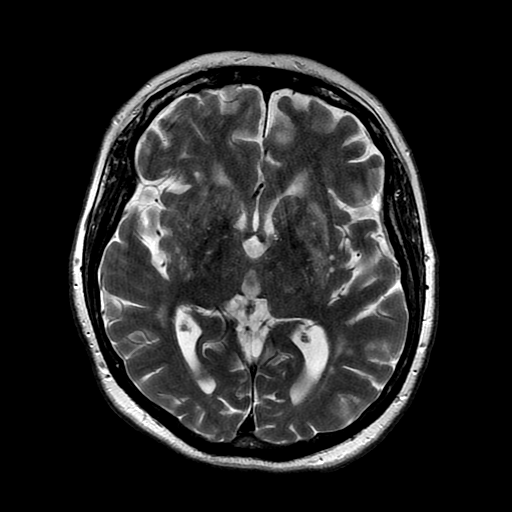
[im 26/26]
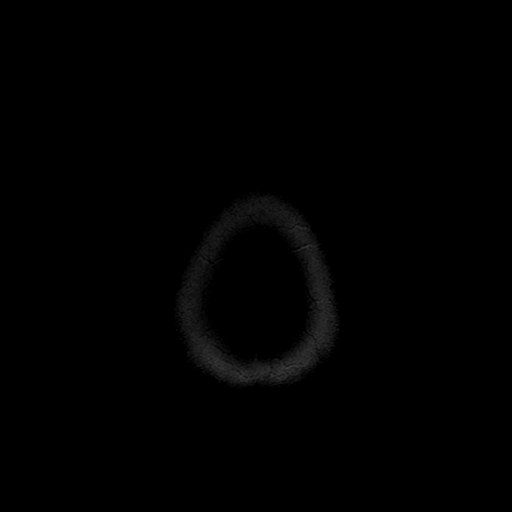

[Series 8: FLAIR · axial · 3.0mm · 0.43mm/px · z∈[-40,+107]mm · 3 of 26 slices shown]
[im 1/26]
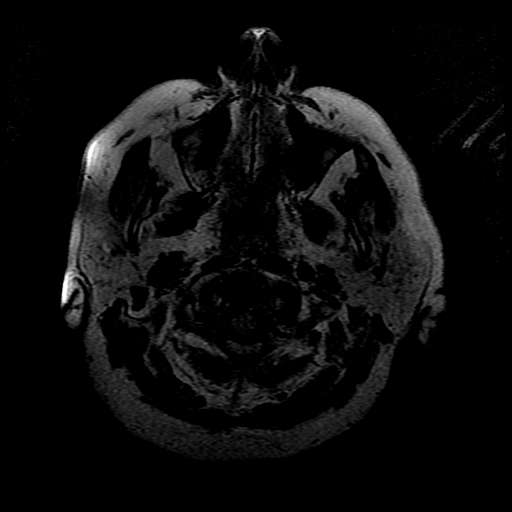
[im 13/26]
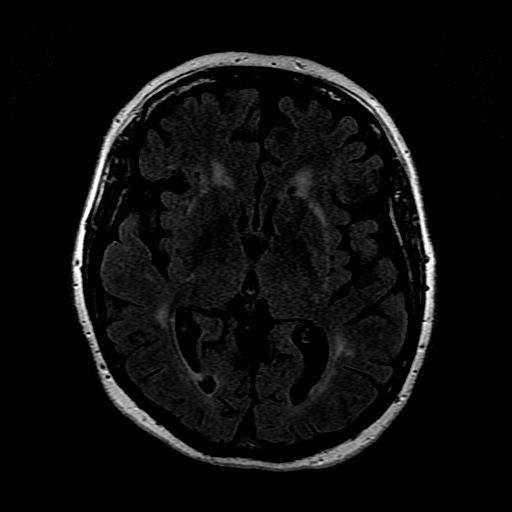
[im 26/26]
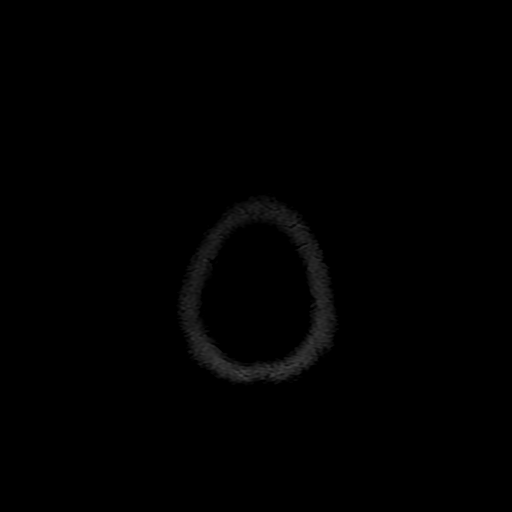

[Series 10: T1 · axial · 3.0mm · 0.43mm/px · 1 of 104 slices shown (2 of 2)]
[im 1/104]
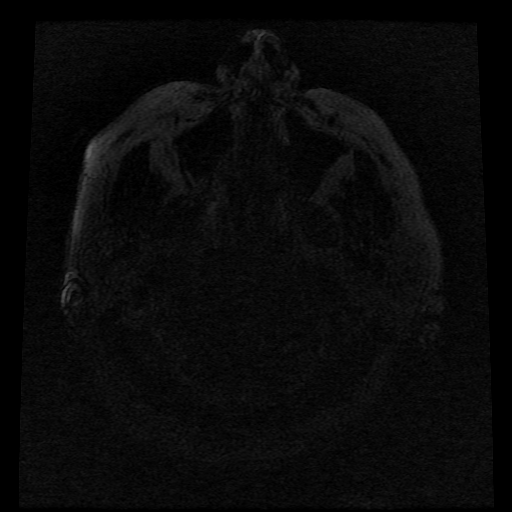

[Series 11: T2 · coronal · 5.0mm · 0.39mm/px · 3 of 26 slices shown (2 of 2)]
[im 1/26]
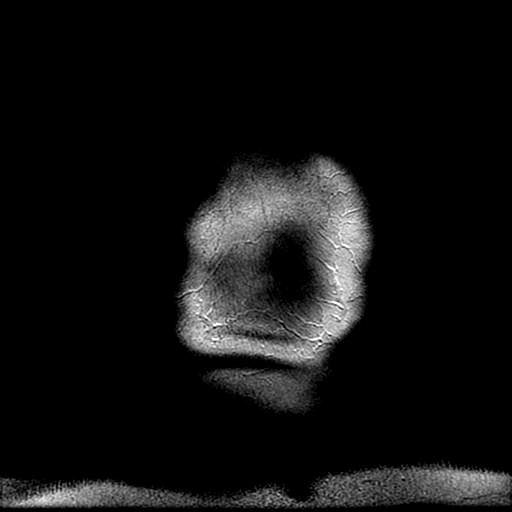
[im 13/26]
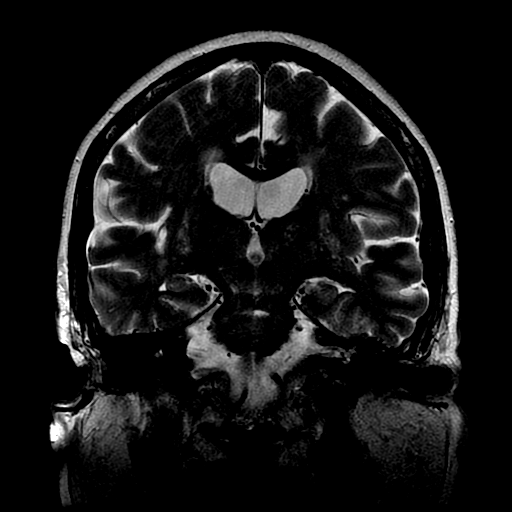
[im 26/26]
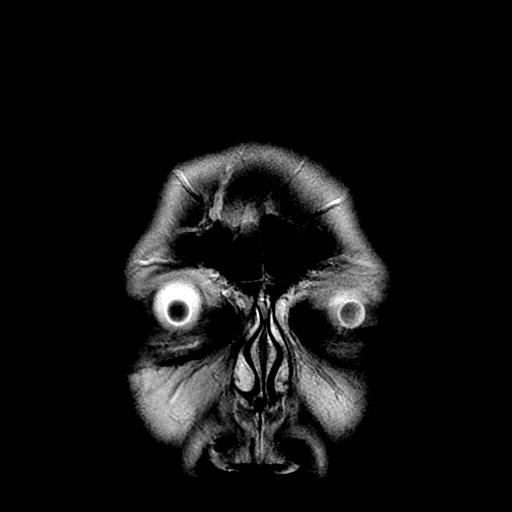

[Series 300: DWI · axial · 3.0mm · 1.09mm/px · z∈[-42,+110]mm · 5 of 52 slices shown (3 of 4)]
[im 1/52]
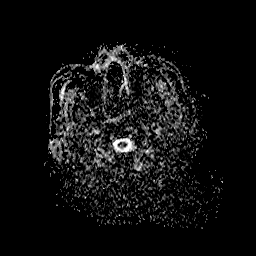
[im 13/52]
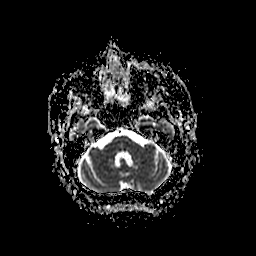
[im 26/52]
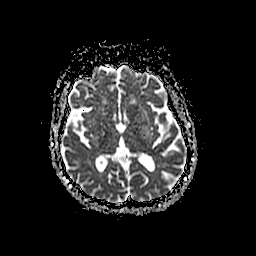
[im 39/52]
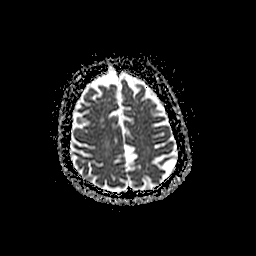
[im 52/52]
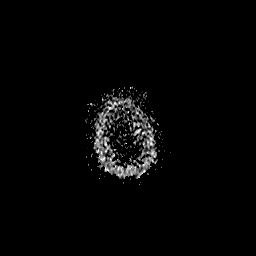

[Series 500: DWI · coronal · 5.0mm · 1.09mm/px · 3 of 33 slices shown (4 of 4)]
[im 1/33]
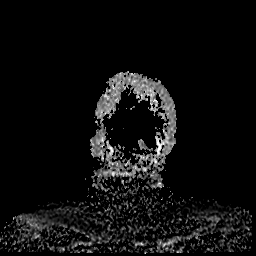
[im 17/33]
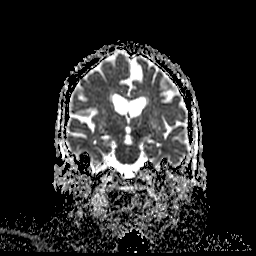
[im 33/33]
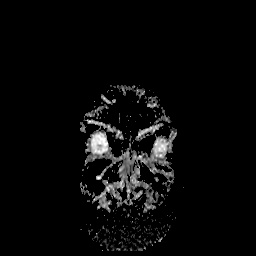

[35 of 48 positions shown; findings below may reference images not displayed]

FINDINGS: MRI HEAD FINDINGS

Brain: No acute infarction, hemorrhage, hydrocephalus, extra-axial
collection or mass lesion. Mild a moderate scattered T2/FLAIR
hyperintensities in the white matter, nonspecific but compatible
with chronic microvascular disease

Vascular: Detailed below.

Skull and upper cervical spine: Normal marrow signal.

Sinuses/Orbits: Mild paranasal sinus mucosal thickening. No acute
orbital findings.

Other: Small left mastoid effusions.

MRA HEAD FINDINGS

Mildly motion limited study.

Anterior circulation: Bilateral intracranial ICAs, MCAs, and ACAs
are patent without proximal hemodynamically significant stenosis.
Small bilateral supraclinoid infundibula.

Posterior circulation: Bilateral intradural vertebral arteries,
basilar artery and both posterior cerebral arteries are patent
without proximal hemodynamically significant stenosis. Small
bilateral posterior communicating arteries are faintly visualized.
IMPRESSION: 1. No evidence of acute intracranial abnormality
2. No emergent large vessel occlusion or proximal hemodynamically
significant stenosis.
3. Moderate chronic microvascular disease.

## 2023-04-09 NOTE — Progress Notes (Signed)
Cardiac Individual Treatment Plan  Patient Details  Name: Angel Reynolds MRN: [id number] Date of Birth: 30-Nov-1955 Referring Provider:   Flowsheet Row INTENSIVE CARDIAC REHAB ORIENT from 01/29/2023 in [facility]  Referring Provider Georgeanna Lea, MD       Initial Encounter Date:  Flowsheet Row INTENSIVE CARDIAC REHAB ORIENT from 01/29/2023 in [facility]  Date 01/29/23       Visit Diagnosis: 12/11/22 S/P DES Mid LAD  Patient's Home Medications on Admission:  Current Outpatient Medications:    aspirin 81 MG tablet, Take 81 mg by mouth every evening., Disp: , Rfl:    atorvastatin (LIPITOR) 80 MG tablet, Take 80 mg by mouth every evening., Disp: , Rfl:    Cholecalciferol 25 MCG (1000 UT) capsule, Take 1,000 Units by mouth in the morning., Disp: , Rfl:    clopidogrel (PLAVIX) 75 MG tablet, Take 1 tablet (75 mg total) by mouth daily., Disp: 30 tablet, Rfl: 6   clotrimazole-betamethasone (LOTRISONE) cream, Apply 1 application. topically daily as needed (Eczema)., Disp: , Rfl:    escitalopram (LEXAPRO) 10 MG tablet, Take 10 mg by mouth every evening., Disp: , Rfl:    famotidine (PEPCID) 20 MG tablet, Take 20 mg by mouth every 12 (twelve) hours as needed for heartburn or indigestion., Disp: , Rfl:    glimepiride (AMARYL) 4 MG tablet, Take 4 mg by mouth daily with breakfast., Disp: , Rfl:    HYDROcodone-acetaminophen (NORCO/VICODIN) 5-325 MG tablet, Take 1 tablet by mouth every 8 (eight) hours as needed for pain., Disp: , Rfl:    isosorbide mononitrate (IMDUR) 120 MG 24 hr tablet, Take 1 tablet (120 mg total) by mouth daily., Disp: 90 tablet, Rfl: 3   lisinopril (ZESTRIL) 10 MG tablet, Take 1 tablet by mouth once daily, Disp: 90 tablet, Rfl: 0   Menthol, Topical Analgesic, (ICY HOT) 5 % PTCH, Place 1 patch onto the skin daily as needed (pain.)., Disp: , Rfl:    metFORMIN  (GLUCOPHAGE) 850 MG tablet, Take 850 mg by mouth 3 (three) times daily., Disp: , Rfl:    metoprolol tartrate (LOPRESSOR) 25 MG tablet, Take 1 tablet by mouth twice daily, Disp: 180 tablet, Rfl: 3   nitroGLYCERIN (NITROSTAT) 0.4 MG SL tablet, Place 1 tablet (0.4 mg total) under the tongue every 5 (five) minutes as needed for chest pain., Disp: 35 tablet, Rfl: 2   OZEMPIC, 1 MG/DOSE, 4 MG/3ML SOPN, Inject 1 mg into the skin once a week. (Patient not taking: Reported on 01/29/2023), Disp: , Rfl:    traMADol (ULTRAM) 50 MG tablet, Take 50 mg by mouth 3 (three) times daily as needed for pain., Disp: , Rfl:   Past Medical History: Past Medical History:  Diagnosis Date   Anxiety    Benign essential hypertension    Chronic constipation    Coronary artery disease    Diabetes mellitus    AODM   Elevated liver enzymes    Hypercholesteremia    Hyperlipidemia    Lower back pain    Obesity    Osteoarthritis of left knee 01/06/2021   Peripheral neuropathy    Recurrent major depressive disorder in partial remission (HCC)    Type 2 diabetes mellitus with obesity (HCC)     Tobacco Use: Social History   Tobacco Use  Smoking Status Former   Types: E-cigarettes, Cigarettes  Smokeless Tobacco Never  Tobacco Comments  Stopped vaping in 2023    Labs: Review Flowsheet       Latest Ref Rng & Units 06/03/2008 03/19/2022 07/25/2022  Labs for ITP Cardiac and Pulmonary Rehab  Cholestrol 100 - 199 mg/dL - - 161   LDL (calc) 0 - 99 mg/dL - - 71   HDL-C >09 mg/dL - - 41   Trlycerides 0 - 149 mg/dL - - 604   Hemoglobin V4U 4.8 - 5.6 % - 8.2  -  TCO2 - 29  - -    Capillary Blood Glucose: Lab Results  Component Value Date   GLUCAP 174 (H) 02/18/2023   GLUCAP 78 02/18/2023   GLUCAP 40 (LL) 02/18/2023   GLUCAP 120 (H) 02/08/2023   GLUCAP 164 (H) 02/08/2023     Exercise Target Goals: Exercise Program Goal: Individual exercise prescription set using results from initial 6 min walk test and THRR  while considering  patient's activity barriers and safety.   Exercise Prescription Goal: Initial exercise prescription builds to 30-45 minutes a day of aerobic activity, 2-3 days per week.  Home exercise guidelines will be given to patient during program as part of exercise prescription that the participant will acknowledge.  Activity Barriers & Risk Stratification:  Activity Barriers & Cardiac Risk Stratification - 01/29/23 1135       Activity Barriers & Cardiac Risk Stratification   Activity Barriers Arthritis;Back Problems;Joint Problems;Balance Concerns;Assistive Device;Other (comment)    Comments Chronic low back pain, DJD;  osteoarthritis in both knees, bone-on-bone.    Cardiac Risk Stratification Moderate             6 Minute Walk:  6 Minute Walk     Row Name 01/29/23 1237 04/03/23 1201       6 Minute Walk   Phase Initial Discharge    Distance 472 feet 796 feet    Distance % Change -- 68.64 %    Distance Feet Change -- 324 ft    Walk Time 4.85 minutes 6 minutes    # of Rest Breaks 1  Seated for 1.15 minutes 1  47 seconds    MPH 1.1 1.51    METS 1.52 1.99    RPE 12 10    Perceived Dyspnea  0 2    VO2 Peak 5.31 6.99    Symptoms Yes (comment) Yes (comment)    Comments Low back, bilateral knee pain-chronic, 6/10 on pain scale. BL knee pain 5/10    Resting HR 70 bpm 62 bpm    Resting BP 128/58 136/60    Resting Oxygen Saturation  98 % --    Exercise Oxygen Saturation  during 6 min walk 97 % --    Max Ex. HR 93 bpm 97 bpm    Max Ex. BP 166/62 154/68    2 Minute Post BP 122/58 --             Oxygen Initial Assessment:   Oxygen Re-Evaluation:   Oxygen Discharge (Final Oxygen Re-Evaluation):   Initial Exercise Prescription:  Initial Exercise Prescription - 01/29/23 1200       Date of Initial Exercise RX and Referring Provider   Date 01/29/23    Referring Provider Georgeanna Lea, MD    Expected Discharge Date 04/12/23      NuStep   Level 1     SPM 70    Minutes 20    METs 1.5      Prescription Details   Frequency (times per week) 3  Duration Progress to 30 minutes of continuous aerobic without signs/symptoms of physical distress      Intensity   THRR 40-80% of Max Heartrate 61-122    Ratings of Perceived Exertion 11-13    Perceived Dyspnea 0-4      Progression   Progression Continue to progress workloads to maintain intensity without signs/symptoms of physical distress.      Resistance Training   Training Prescription Yes    Weight 2 lbs    Reps 10-15             Perform Capillary Blood Glucose checks as needed.  Exercise Prescription Changes:   Exercise Prescription Changes     Row Name 02/04/23 1028 02/18/23 1031 03/04/23 1030 03/18/23 1026 04/08/23 1029     Response to Exercise   Blood Pressure (Admit) 128/52 128/60 118/60 114/62 160/60   Blood Pressure (Exercise) 132/54 124/68 142/64 148/62 120/60   Blood Pressure (Exit) 110/52 136/54 120/58 112/62 124/64   Heart Rate (Admit) 65 bpm 70 bpm 69 bpm 80 bpm 58 bpm   Heart Rate (Exercise) 83 bpm 88 bpm 93 bpm 103 bpm 99 bpm   Heart Rate (Exit) 71 bpm 65 bpm 69 bpm 84 bpm 67 bpm   Rating of Perceived Exertion (Exercise) 12 11 10 11 9    Symptoms None Hypoglycemic after exercise. None None None   Comments Off to a good start with exercise. Low blood sugar, no weights today. -- -- Reviewed METs and goals.   Duration Progress to 30 minutes of  aerobic without signs/symptoms of physical distress Progress to 30 minutes of  aerobic without signs/symptoms of physical distress Progress to 30 minutes of  aerobic without signs/symptoms of physical distress Progress to 30 minutes of  aerobic without signs/symptoms of physical distress Progress to 30 minutes of  aerobic without signs/symptoms of physical distress   Intensity THRR unchanged THRR unchanged THRR unchanged THRR unchanged THRR unchanged     Progression   Progression Continue to progress workloads to  maintain intensity without signs/symptoms of physical distress. Continue to progress workloads to maintain intensity without signs/symptoms of physical distress. Continue to progress workloads to maintain intensity without signs/symptoms of physical distress. Continue to progress workloads to maintain intensity without signs/symptoms of physical distress. Continue to progress workloads to maintain intensity without signs/symptoms of physical distress.   Average METs 1.2 1.4 1.6 2 2      Resistance Training   Training Prescription Yes No Yes Yes Yes   Weight 2 lbs -- 2 lbs 2 lbs 2 lbs   Reps 10-15 -- 10-15 10-15 10-15   Time 10 Minutes -- 10 Minutes 10 Minutes 10 Minutes     Interval Training   Interval Training No No No No No     NuStep   Level 1 1 1 2 2    SPM 38 58 70 84 97   Minutes 25 25 25 25 25    METs 1.2 1.4 1.6 2 2      Home Exercise Plan   Plans to continue exercise at -- -- -- Home (comment)  Slim cycle at home Home (comment)  Slim cycle at home   Frequency -- -- -- Add 2 additional days to program exercise sessions. Add 2 additional days to program exercise sessions.   Initial Home Exercises Provided -- -- -- 03/15/23 03/15/23            Exercise Comments:   Exercise Comments     Row Name 02/04/23 1130 02/06/23 1100 02/18/23  1053 03/04/23 1101 03/15/23 1042   Exercise Comments Patient tolerated low intensity exercise well without symptoms. Oriented patient to warm-up/cool-down stretches seated in chair. Reviewed METs with patient. Reviewed goals with patient. Reviewed METs with patient. Reviewed home exercise and goals with patient.    Row Name 04/08/23 1047           Exercise Comments Reviewed METs and goals with patient.                Exercise Goals and Review:   Exercise Goals     Row Name 01/29/23 1135             Exercise Goals   Increase Physical Activity Yes       Intervention Provide advice, education, support and counseling about physical  activity/exercise needs.;Develop an individualized exercise prescription for aerobic and resistive training based on initial evaluation findings, risk stratification, comorbidities and participant's personal goals.       Expected Outcomes Short Term: Attend rehab on a regular basis to increase amount of physical activity.;Long Term: Add in home exercise to make exercise part of routine and to increase amount of physical activity.;Long Term: Exercising regularly at least 3-5 days a week.       Increase Strength and Stamina Yes       Intervention Provide advice, education, support and counseling about physical activity/exercise needs.;Develop an individualized exercise prescription for aerobic and resistive training based on initial evaluation findings, risk stratification, comorbidities and participant's personal goals.       Expected Outcomes Short Term: Increase workloads from initial exercise prescription for resistance, speed, and METs.;Short Term: Perform resistance training exercises routinely during rehab and add in resistance training at home;Long Term: Improve cardiorespiratory fitness, muscular endurance and strength as measured by increased METs and functional capacity ( )       Able to understand and use rate of perceived exertion (RPE) scale Yes       Intervention Provide education and explanation on how to use RPE scale       Expected Outcomes Short Term: Able to use RPE daily in rehab to express subjective intensity level;Long Term:  Able to use RPE to guide intensity level when exercising independently       Knowledge and understanding of Target Heart Rate Range (THRR) Yes       Intervention Provide education and explanation of THRR including how the numbers were predicted and where they are located for reference       Expected Outcomes Short Term: Able to state/look up THRR;Long Term: Able to use THRR to govern intensity when exercising independently;Short Term: Able to use daily as  guideline for intensity in rehab       Able to check pulse independently Yes       Intervention Provide education and demonstration on how to check pulse in carotid and radial arteries.;Review the importance of being able to check your own pulse for safety during independent exercise       Expected Outcomes Short Term: Able to explain why pulse checking is important during independent exercise;Long Term: Able to check pulse independently and accurately       Understanding of Exercise Prescription Yes       Intervention Provide education, explanation, and written materials on patient's individual exercise prescription       Expected Outcomes Short Term: Able to explain program exercise prescription;Long Term: Able to explain home exercise prescription to exercise independently  Exercise Goals Re-Evaluation :  Exercise Goals Re-Evaluation     Row Name 02/04/23 1130 02/18/23 1053 03/15/23 1042 04/08/23 1047       Exercise Goal Re-Evaluation   Exercise Goals Review Increase Physical Activity;Increase Strength and Stamina;Able to understand and use rate of perceived exertion (RPE) scale Increase Physical Activity;Increase Strength and Stamina;Able to understand and use rate of perceived exertion (RPE) scale Increase Physical Activity;Increase Strength and Stamina;Able to understand and use rate of perceived exertion (RPE) scale;Understanding of Exercise Prescription;Able to check pulse independently;Knowledge and understanding of Target Heart Rate Range (THRR) Increase Physical Activity;Increase Strength and Stamina;Able to understand and use rate of perceived exertion (RPE) scale;Understanding of Exercise Prescription;Able to check pulse independently;Knowledge and understanding of Target Heart Rate Range (THRR)    Comments Patient able to understand and use RPE scale appropriately. Patient is limited by knee pain. Per patient both knees bone on bone, and she needs knee replacement.  Her goal is to be able to get around better. Patient is walking her dog and has a Slim cycle that she's not currently using but can incorporate into her routine. Reviewed exercise prescription with patient. Patient's goal is to increase her energy level. Patient is able to manually check her pulse. She plans to get her Slim cycle out this weekend, so that she can use that as her mode of exercise at home. Patient will complete cardiac rehab program this week and has made gradual progress. She plans to continue exercise at home using her Slim Cycle.    Expected Outcomes Progress workloads as tolerated to help increase strength and stamina. Continue to progress workloads as tolerated. Add 1 -2 days walking at home, taking rest breaks as tolerated. Patient will exercise on her Slim cycle 1-2 days/week in addition to exercise at cardiac rehab. Patient will continue exercise at home on her Slim Cycle upon completion of the cardiac rehab program.             Discharge Exercise Prescription (Final Exercise Prescription Changes):  Exercise Prescription Changes - 04/08/23 1029       Response to Exercise   Blood Pressure (Admit) 160/60    Blood Pressure (Exercise) 120/60    Blood Pressure (Exit) 124/64    Heart Rate (Admit) 58 bpm    Heart Rate (Exercise) 99 bpm    Heart Rate (Exit) 67 bpm    Rating of Perceived Exertion (Exercise) 9    Symptoms None    Comments Reviewed METs and goals.    Duration Progress to 30 minutes of  aerobic without signs/symptoms of physical distress    Intensity THRR unchanged      Progression   Progression Continue to progress workloads to maintain intensity without signs/symptoms of physical distress.    Average METs 2      Resistance Training   Training Prescription Yes    Weight 2 lbs    Reps 10-15    Time 10 Minutes      Interval Training   Interval Training No      NuStep   Level 2    SPM 97    Minutes 25    METs 2      Home Exercise Plan   Plans to  continue exercise at Home (comment)   Slim cycle at home   Frequency Add 2 additional days to program exercise sessions.    Initial Home Exercises Provided 03/15/23             Nutrition:  Target Goals: Understanding of nutrition guidelines, daily intake of sodium 1500mg , cholesterol 200mg , calories 30% from fat and 7% or less from saturated fats, daily to have 5 or more servings of fruits and vegetables.  Biometrics:  Pre Biometrics - 01/29/23 1157       Pre Biometrics   Waist Circumference 44.5 inches    Hip Circumference 46.25 inches    Waist to Hip Ratio 0.96 %    Triceps Skinfold 22 mm    % Body Fat 42.8 %    Grip Strength 16 kg    Flexibility --   Not performed, chronic back pain.   Single Leg Stand 0.87 seconds             Post Biometrics - 04/03/23 1209        Post  Biometrics   Height 5' 3.25" (1.607 m)    Weight 81 kg    Waist Circumference 46.5 inches    Hip Circumference 47 inches    Waist to Hip Ratio 0.99 %    BMI (Calculated) 31.37    Triceps Skinfold 22 mm    % Body Fat 44 %    Grip Strength 18 kg    Flexibility --   not performed   Single Leg Stand 3.5 seconds             Nutrition Therapy Plan and Nutrition Goals:  Nutrition Therapy & Goals - 04/04/23 0904       Nutrition Therapy   Diet Heart healthy/carbohydrate consistent diet    Drug/Food Interactions Statins/Certain Fruits      Personal Nutrition Goals   Nutrition Goal Patient to identify strategies for reducing cardiovascular risk by attending the [employer] education and nutrition series weekly.    Personal Goal #2 Patient to improve diet quality by using the plate method as a guide for meal planning to include lean protein/plant protein, fruits, vegetables, whole grains, nonfat dairy as part of a well-balanced diet.    Personal Goal #3 Patient to reduce sodium to 1500mg  per day    Comments Mkenna remains pre-contemplative toward dietary changes/lifestyle changes. She does not  attend the [employer] education and nutrition series regularly. She reports being a "picky" eater and reports very little fruit/vegetable intake. Her husband does a lot of the cooking and is trying to make heart healthy choices. She is up 5.3# since starting with our program. Tammie will benefit from participation in intensive cardiac rehab for nutrition, exercise, and lifestyle modification.      Intervention Plan   Intervention Prescribe, educate and counsel regarding individualized specific dietary modifications aiming towards targeted core components such as weight, hypertension, lipid management, diabetes, heart failure and other comorbidities.;Nutrition handout(s) given to patient.    Expected Outcomes Short Term Goal: Understand basic principles of dietary content, such as calories, fat, sodium, cholesterol and nutrients.;Long Term Goal: Adherence to prescribed nutrition plan.             Nutrition Assessments:  Nutrition Assessments - 04/08/23 0905       Rate Your Plate Scores   Post Score 65            MEDIFICTS Score Key: ?70 Need to make dietary changes  40-70 Heart Healthy Diet ? 40 Therapeutic Level Cholesterol Diet   Flowsheet Row INTENSIVE CARDIAC REHAB from 04/05/2023 in [facility]  Picture Your Plate Total Score on Discharge 65      Picture Your Plate Scores: <  40 Unhealthy dietary pattern with much room for improvement. 41-50 Dietary pattern unlikely to meet recommendations for good health and room for improvement. 51-60 More healthful dietary pattern, with some room for improvement.  >60 Healthy dietary pattern, although there may be some specific behaviors that could be improved.    Nutrition Goals Re-Evaluation:  Nutrition Goals Re-Evaluation     Row Name 02/04/23 1134 03/06/23 0922 04/04/23 0904         Goals   Current Weight 174 lb 9.7 oz (79.2 kg) 174 lb 13.2 oz (79.3 kg) 178 lb 9.2 oz (81  kg)     Comment lipoprotein A 342.6, A1c 8.2, lipids WNL, B12 low (107) No new labs; most recent labs lipoprotein A 342.6, A1c 8.2, lipids WNL, B12 low (107) no new labs; most recent labs lipoprotein A 342.6, A1c 8.2, lipids WNL, B12 low (107)     Expected Outcome Angel Reynolds reports pre-contemplation toward dietary changes/lifestyle changes. She reports being a "picky" eater and reports eating very few fruits and vegetables. Her husband does a lot of the cooking and is trying to make heart healthy choices. Angel Reynolds will benefit from participation in intensive cardiac rehab for nutrition, exercise, and lifestyle modification. Angel Reynolds remains pre-contemplative toward dietary changes/lifestyle changes. She reports being a "picky" eater and reports very little fruit/vegetable intake. Her husband does a lot of the cooking and is trying to make heart healthy choices. Jacqulene will benefit from participation in intensive cardiac rehab for nutrition, exercise, and lifestyle modification. Angel Reynolds remains pre-contemplative toward dietary changes/lifestyle changes. She does not attend the [employer] education and nutrition series regularly. She reports being a "picky" eater and reports very little fruit/vegetable intake. Her husband does a lot of the cooking and is trying to make heart healthy choices. She is up 5.3# since starting with our program. Noell will benefit from participation in intensive cardiac rehab for nutrition, exercise, and lifestyle modification.              Nutrition Goals Re-Evaluation:  Nutrition Goals Re-Evaluation     Row Name 02/04/23 1134 03/06/23 0922 04/04/23 0904         Goals   Current Weight 174 lb 9.7 oz (79.2 kg) 174 lb 13.2 oz (79.3 kg) 178 lb 9.2 oz (81 kg)     Comment lipoprotein A 342.6, A1c 8.2, lipids WNL, B12 low (107) No new labs; most recent labs lipoprotein A 342.6, A1c 8.2, lipids WNL, B12 low (107) no new labs; most recent labs lipoprotein A 342.6, A1c 8.2, lipids WNL, B12 low (107)      Expected Outcome Angel Reynolds reports pre-contemplation toward dietary changes/lifestyle changes. She reports being a "picky" eater and reports eating very few fruits and vegetables. Her husband does a lot of the cooking and is trying to make heart healthy choices. Angel Reynolds will benefit from participation in intensive cardiac rehab for nutrition, exercise, and lifestyle modification. Angel Reynolds remains pre-contemplative toward dietary changes/lifestyle changes. She reports being a "picky" eater and reports very little fruit/vegetable intake. Her husband does a lot of the cooking and is trying to make heart healthy choices. Angel Reynolds will benefit from participation in intensive cardiac rehab for nutrition, exercise, and lifestyle modification. Angel Reynolds remains pre-contemplative toward dietary changes/lifestyle changes. She does not attend the [employer] education and nutrition series regularly. She reports being a "picky" eater and reports very little fruit/vegetable intake. Her husband does a lot of the cooking and is trying to make heart healthy choices. She is up 5.3# since starting with  our program. Angel Reynolds will benefit from participation in intensive cardiac rehab for nutrition, exercise, and lifestyle modification.              Nutrition Goals Discharge (Final Nutrition Goals Re-Evaluation):  Nutrition Goals Re-Evaluation - 04/04/23 0904       Goals   Current Weight 178 lb 9.2 oz (81 kg)    Comment no new labs; most recent labs lipoprotein A 342.6, A1c 8.2, lipids WNL, B12 low (107)    Expected Outcome Angel Reynolds remains pre-contemplative toward dietary changes/lifestyle changes. She does not attend the [employer] education and nutrition series regularly. She reports being a "picky" eater and reports very little fruit/vegetable intake. Her husband does a lot of the cooking and is trying to make heart healthy choices. She is up 5.3# since starting with our program. Angel Reynolds will benefit from participation in intensive cardiac rehab for  nutrition, exercise, and lifestyle modification.             Psychosocial: Target Goals: Acknowledge presence or absence of significant depression and/or stress, maximize coping skills, provide positive support system. Participant is able to verbalize types and ability to use techniques and skills needed for reducing stress and depression.  Initial Review & Psychosocial Screening:  Initial Psych Review & Screening - 01/29/23 1202       Initial Review   Current issues with Current Stress Concerns;History of Depression;Current Depression    Source of Stress Concerns Chronic Illness;Family   Has chronic pain lower back and bilateral knee pain   Comments Angel Reynolds's daughter needs a heart and lung transplant. Angel Reynolds has chronic lower back and knee pain      Family Dynamics   Good Support System? Yes   Angel Reynolds has her husband, daughter and 2 sisiters for support     Barriers   Psychosocial barriers to participate in program There are no identifiable barriers or psychosocial needs.      Screening Interventions   Interventions Encouraged to exercise    Expected Outcomes Short Term goal: Utilizing psychosocial counselor, staff and physician to assist with identification of specific Stressors or current issues interfering with healing process. Setting desired goal for each stressor or current issue identified.;Long Term Goal: Stressors or current issues are controlled or eliminated.;Short Term goal: Identification and review with participant of any Quality of Life or Depression concerns found by scoring the questionnaire.             Quality of Life Scores:  Quality of Life - 04/05/23 1449       Quality of Life Scores   Health/Function Pre 19.1 %    Health/Function Post 20.77 %    Health/Function % Change 8.74 %    Socioeconomic Pre 18.33 %    Socioeconomic Post 20.43 %    Socioeconomic % Change  11.46 %    Psych/Spiritual Pre 16.79 %    Psych/Spiritual Post 22 %    Psych/Spiritual %  Change 31.03 %    Family Pre 22.8 %    Family Post 18.1 %    Family % Change -20.61 %    GLOBAL Pre 19.03 %    GLOBAL Post 20.52 %    GLOBAL % Change 7.83 %            Scores of 19 and below usually indicate a poorer quality of life in these areas.  A difference of  2-3 points is a clinically meaningful difference.  A difference of 2-3 points in the total score of  the Quality of Life Index has been associated with significant improvement in overall quality of life, self-image, physical symptoms, and general health in studies assessing change in quality of life.  PHQ-9: Review Flowsheet       01/29/2023  Depression screen PHQ 2/9  Decreased Interest 1  Down, Depressed, Hopeless 0  PHQ - 2 Score 1  Altered sleeping 2  Tired, decreased energy 3  Change in appetite 2  Feeling bad or failure about yourself  0  Trouble concentrating 1  Moving slowly or fidgety/restless 0  Suicidal thoughts 0  PHQ-9 Score 9  Difficult doing work/chores Somewhat difficult   Interpretation of Total Score  Total Score Depression Severity:  1-4 = Minimal depression, 5-9 = Mild depression, 10-14 = Moderate depression, 15-19 = Moderately severe depression, 20-27 = Severe depression   Psychosocial Evaluation and Intervention:   Psychosocial Re-Evaluation:  Psychosocial Re-Evaluation     Row Name 02/04/23 1710 02/14/23 1405 03/12/23 1446 04/09/23 1148       Psychosocial Re-Evaluation   Current issues with Current Stress Concerns;History of Depression Current Stress Concerns;History of Depression Current Stress Concerns;History of Depression Current Stress Concerns;History of Depression    Comments Will review quality of life questionnaire in the upcoming week Will review quality of life questionnaire in the near future. Mekhia does have health and family stressors. Quality of life reviewed on 02/20/23. Gali denies being depressed. Jonna says her energy level is improving since since she has  participating in the program Angel Reynolds has not voiced any increased concerns or stressors during exercise at intensive cardiac rehab. Angel Reynolds will complete intensive cardiac rehab on 04/12/23    Expected Outcomes Angel Reynolds will have decreased or controlled stressors and controlled depression upon completion of intensive cardiac rehab. Angel Reynolds will have decreased or controlled stressors and controlled depression upon completion of intensive cardiac rehab. Angel Reynolds will have decreased or controlled stressors and controlled depression upon completion of intensive cardiac rehab. Angel Reynolds will have decreased or controlled stressors and controlled depression upon completion of intensive cardiac rehab.    Interventions Encouraged to attend Cardiac Rehabilitation for the exercise;Stress management education;Encouraged to attend Pulmonary Rehabilitation for the exercise Encouraged to attend Cardiac Rehabilitation for the exercise;Stress management education;Encouraged to attend Pulmonary Rehabilitation for the exercise Encouraged to attend Cardiac Rehabilitation for the exercise;Stress management education;Encouraged to attend Pulmonary Rehabilitation for the exercise Encouraged to attend Cardiac Rehabilitation for the exercise;Stress management education;Encouraged to attend Pulmonary Rehabilitation for the exercise    Continue Psychosocial Services  Follow up required by staff Follow up required by staff Follow up required by staff Follow up required by staff      Initial Review   Source of Stress Concerns Family;Chronic Illness;Unable to participate in former interests or hobbies;Unable to perform yard/household activities Family;Chronic Illness;Unable to participate in former interests or hobbies;Unable to perform yard/household activities Family;Chronic Illness;Unable to participate in former interests or hobbies;Unable to perform yard/household activities Family;Chronic Illness;Unable to participate in former interests or hobbies;Unable  to perform yard/household activities    Comments Will continue to monitor and offer support as needed Will continue to monitor and offer support as needed Will continue to monitor and offer support as needed Will continue to monitor and offer support as needed             Psychosocial Discharge (Final Psychosocial Re-Evaluation):  Psychosocial Re-Evaluation - 04/09/23 1148       Psychosocial Re-Evaluation   Current issues with Current Stress Concerns;History of Depression    Comments  Angel Reynolds has not voiced any increased concerns or stressors during exercise at intensive cardiac rehab. Angel Reynolds will complete intensive cardiac rehab on 04/12/23    Expected Outcomes Leiya will have decreased or controlled stressors and controlled depression upon completion of intensive cardiac rehab.    Interventions Encouraged to attend Cardiac Rehabilitation for the exercise;Stress management education;Encouraged to attend Pulmonary Rehabilitation for the exercise    Continue Psychosocial Services  Follow up required by staff      Initial Review   Source of Stress Concerns Family;Chronic Illness;Unable to participate in former interests or hobbies;Unable to perform yard/household activities    Comments Will continue to monitor and offer support as needed             Vocational Rehabilitation: Provide vocational rehab assistance to qualifying candidates.   Vocational Rehab Evaluation & Intervention:  Vocational Rehab - 01/29/23 1229       Initial Vocational Rehab Evaluation & Intervention   Assessment shows need for Vocational Rehabilitation No   Angel Reynolds is retired and does not need vocational rehab at this time            Education: Education Goals: Education classes will be provided on a weekly basis, covering required topics. Participant will state understanding/return demonstration of topics presented.    Education     Row Name 02/04/23 1300     Education   Cardiac Education Topics  Pritikin   Select Workshops     Workshops   Educator Exercise Physiologist   Select Exercise   Exercise Workshop Exercise Basics: Building Your Action Plan   Instruction Review Code 1- Verbalizes Understanding   Class Start Time 1150   Class Stop Time 1235   Class Time Calculation (min) 45 min    Row Name 02/11/23 1400     Education   Cardiac Education Topics Pritikin   Nurse, children's   Educator Dietitian   Select Nutrition   Nutrition Nutrition Action Plan   Instruction Review Code 1- Verbalizes Understanding   Class Start Time 1145   Class Stop Time 1219   Class Time Calculation (min) 34 min    Row Name 02/15/23 1400     Education   Cardiac Education Topics Pritikin   Psychologist, forensic Exercise Education   Exercise Education Move It!   Instruction Review Code 1- Verbalizes Understanding   Class Start Time 1150   Class Stop Time 1230   Class Time Calculation (min) 40 min    Row Name 02/20/23 1300     Education   Cardiac Education Topics Pritikin   Fish farm manager   Weekly Topic One-Pot Wonders   Instruction Review Code 1- Verbalizes Understanding   Class Start Time 1140   Class Stop Time 1212   Class Time Calculation (min) 32 min    Row Name 03/06/23 1200     Education   Cardiac Education Topics Pritikin   Designer, multimedia   Weekly Topic Fast Evening Meals   Instruction Review Code 1- Verbalizes Understanding   Class Start Time 1140   Class Stop Time 1215   Class Time Calculation (min) 35 min    Row Name 03/08/23 1000     Education  Cardiac Education Topics --   Select --     Core Videos   Educator --   Select --   Nutrition --   Instruction Review Code --            Core Videos: Exercise    Move It!  Clinical staff  conducted group or individual video education with verbal and written material and guidebook.  Patient learns the recommended Pritikin exercise program. Exercise with the goal of living a long, healthy life. Some of the health benefits of exercise include controlled diabetes, healthier blood pressure levels, improved cholesterol levels, improved heart and lung capacity, improved sleep, and better body composition. Everyone should speak with their doctor before starting or changing an exercise routine.  Biomechanical Limitations Clinical staff conducted group or individual video education with verbal and written material and guidebook.  Patient learns how biomechanical limitations can impact exercise and how we can mitigate and possibly overcome limitations to have an impactful and balanced exercise routine.  Body Composition Clinical staff conducted group or individual video education with verbal and written material and guidebook.  Patient learns that body composition (ratio of muscle mass to fat mass) is a key component to assessing overall fitness, rather than body weight alone. Increased fat mass, especially visceral belly fat, can put [place] at increased risk for metabolic syndrome, type 2 diabetes, heart disease, and even death. It is recommended to combine diet and exercise (cardiovascular and resistance training) to improve your body composition. Seek guidance from your physician and exercise physiologist before implementing an exercise routine.  Exercise Action Plan Clinical staff conducted group or individual video education with verbal and written material and guidebook.  Patient learns the recommended strategies to achieve and enjoy long-term exercise adherence, including variety, self-motivation, self-efficacy, and positive decision making. Benefits of exercise include fitness, good health, weight management, more energy, better sleep, less stress, and overall well-being.  Medical   Heart  Disease Risk Reduction Clinical staff conducted group or individual video education with verbal and written material and guidebook.  Patient learns our heart is our most vital organ as it circulates oxygen, nutrients, white blood cells, and hormones throughout the entire body, and carries waste away. Data supports a plant-based eating plan like the Pritikin Program for its effectiveness in slowing progression of and reversing heart disease. The video provides a number of recommendations to address heart disease.   Metabolic Syndrome and Belly Fat  Clinical staff conducted group or individual video education with verbal and written material and guidebook.  Patient learns what metabolic syndrome is, how it leads to heart disease, and how one can reverse it and keep it from coming back. You have metabolic syndrome if you have 3 of the following 5 criteria: abdominal obesity, high blood pressure, high triglycerides, low HDL cholesterol, and high blood sugar.  Hypertension and Heart Disease Clinical staff conducted group or individual video education with verbal and written material and guidebook.  Patient learns that high blood pressure, or hypertension, is very common in the [place]. Hypertension is largely due to excessive salt intake, but other important risk factors include being overweight, physical inactivity, drinking too much alcohol, smoking, and not eating enough potassium from fruits and vegetables. High blood pressure is a leading risk factor for heart attack, stroke, congestive heart failure, dementia, kidney failure, and premature death. Long-term effects of excessive salt intake include stiffening of the arteries and thickening of heart muscle and organ damage. Recommendations include ways to reduce hypertension  and the risk of heart disease.  Diseases of Our Time - Focusing on Diabetes Clinical staff conducted group or individual video education with verbal and written material and  guidebook.  Patient learns why the best way to stop diseases of our time is prevention, through food and other lifestyle changes. Medicine (such as prescription pills and surgeries) is often only a Band-Aid on the problem, not a long-term solution. Most common diseases of our time include obesity, type 2 diabetes, hypertension, heart disease, and cancer. The Pritikin Program is recommended and has been proven to help reduce, reverse, and/or prevent the damaging effects of metabolic syndrome.  Nutrition   Overview of the Pritikin Eating Plan  Clinical staff conducted group or individual video education with verbal and written material and guidebook.  Patient learns about the Pritikin Eating Plan for disease risk reduction. The Pritikin Eating Plan emphasizes a wide variety of unrefined, minimally-processed carbohydrates, like fruits, vegetables, whole grains, and legumes. Go, Caution, and Stop food choices are explained. Plant-based and lean animal proteins are emphasized. Rationale provided for low sodium intake for blood pressure control, low added sugars for blood sugar stabilization, and low added fats and oils for coronary artery disease risk reduction and weight management.  Calorie Density  Clinical staff conducted group or individual video education with verbal and written material and guidebook.  Patient learns about calorie density and how it impacts the Pritikin Eating Plan. Knowing the characteristics of the food you choose will help you decide whether those foods will lead to weight gain or weight loss, and whether you want to consume more or less of them. Weight loss is usually a side effect of the Pritikin Eating Plan because of its focus on low calorie-dense foods.  Label Reading  Clinical staff conducted group or individual video education with verbal and written material and guidebook.  Patient learns about the Pritikin recommended label reading guidelines and corresponding  recommendations regarding calorie density, added sugars, sodium content, and whole grains.  Dining Out - Part 1  Clinical staff conducted group or individual video education with verbal and written material and guidebook.  Patient learns that restaurant meals can be sabotaging because they can be so high in calories, fat, sodium, and/or sugar. Patient learns recommended strategies on how to positively address this and avoid unhealthy pitfalls.  Facts on Fats  Clinical staff conducted group or individual video education with verbal and written material and guidebook.  Patient learns that lifestyle modifications can be just as effective, if not more so, as many medications for lowering your risk of heart disease. A Pritikin lifestyle can help to reduce your risk of inflammation and atherosclerosis (cholesterol build-up, or plaque, in the artery walls). Lifestyle interventions such as dietary choices and physical activity address the cause of atherosclerosis. A review of the types of fats and their impact on blood cholesterol levels, along with dietary recommendations to reduce fat intake is also included.  Nutrition Action Plan  Clinical staff conducted group or individual video education with verbal and written material and guidebook.  Patient learns how to incorporate Pritikin recommendations into their lifestyle. Recommendations include planning and keeping personal health goals in mind as an important part of their success.  Healthy Mind-Set    Healthy Minds, Bodies, Hearts  Clinical staff conducted group or individual video education with verbal and written material and guidebook.  Patient learns how to identify when they are stressed. Video will discuss the impact of that stress, as well as the  many benefits of stress management. Patient will also be introduced to stress management techniques. The way we think, act, and feel has an impact on our hearts.  How Our Thoughts Can Heal Our Hearts   Clinical staff conducted group or individual video education with verbal and written material and guidebook.  Patient learns that negative thoughts can cause depression and anxiety. This can result in negative lifestyle behavior and serious health problems. Cognitive behavioral therapy is an effective method to help control our thoughts in order to change and improve our emotional outlook.  Additional Videos:  Exercise    Improving Performance  Clinical staff conducted group or individual video education with verbal and written material and guidebook.  Patient learns to use a non-linear approach by alternating intensity levels and lengths of time spent exercising to help burn more calories and lose more body fat. Cardiovascular exercise helps improve heart health, metabolism, hormonal balance, blood sugar control, and recovery from fatigue. Resistance training improves strength, endurance, balance, coordination, reaction time, metabolism, and muscle mass. Flexibility exercise improves circulation, posture, and balance. Seek guidance from your physician and exercise physiologist before implementing an exercise routine and learn your capabilities and proper form for all exercise.  Introduction to Yoga  Clinical staff conducted group or individual video education with verbal and written material and guidebook.  Patient learns about yoga, a discipline of the coming together of mind, breath, and body. The benefits of yoga include improved flexibility, improved range of motion, better posture and core strength, increased lung function, weight loss, and positive self-image. Yoga's heart health benefits include lowered blood pressure, healthier heart rate, decreased cholesterol and triglyceride levels, improved immune function, and reduced stress. Seek guidance from your physician and exercise physiologist before implementing an exercise routine and learn your capabilities and proper form for all  exercise.  Medical   Aging: Enhancing Your Quality of Life  Clinical staff conducted group or individual video education with verbal and written material and guidebook.  Patient learns key strategies and recommendations to stay in good physical health and enhance quality of life, such as prevention strategies, having an advocate, securing a Health Care Proxy and Power of Attorney, and keeping a list of medications and system for tracking them. It also discusses how to avoid risk for bone loss.  Biology of Weight Control  Clinical staff conducted group or individual video education with verbal and written material and guidebook.  Patient learns that weight gain occurs because we consume more calories than we burn (eating more, moving less). Even if your body weight is normal, you may have higher ratios of fat compared to muscle mass. Too much body fat puts you at increased risk for cardiovascular disease, heart attack, stroke, type 2 diabetes, and obesity-related cancers. In addition to exercise, following the Pritikin Eating Plan can help reduce your risk.  Decoding Lab Results  Clinical staff conducted group or individual video education with verbal and written material and guidebook.  Patient learns that lab test reflects one measurement whose values change over time and are influenced by many factors, including medication, stress, sleep, exercise, food, hydration, pre-existing medical conditions, and more. It is recommended to use the knowledge from this video to become more involved with your lab results and evaluate your numbers to speak with your doctor.   Diseases of Our Time - Overview  Clinical staff conducted group or individual video education with verbal and written material and guidebook.  Patient learns that according to the CDC, 50%  to 70% of chronic diseases (such as obesity, type 2 diabetes, elevated lipids, hypertension, and heart disease) are avoidable through lifestyle  improvements including healthier food choices, listening to satiety cues, and increased physical activity.  Sleep Disorders Clinical staff conducted group or individual video education with verbal and written material and guidebook.  Patient learns how good quality and duration of sleep are important to overall health and well-being. Patient also learns about sleep disorders and how they impact health along with recommendations to address them, including discussing with a physician.  Nutrition  Dining Out - Part 2 Clinical staff conducted group or individual video education with verbal and written material and guidebook.  Patient learns how to plan ahead and communicate in order to maximize their dining experience in a healthy and nutritious manner. Included are recommended food choices based on the type of restaurant the patient is visiting.   Fueling a Banker conducted group or individual video education with verbal and written material and guidebook.  There is a strong connection between our food choices and our health. Diseases like obesity and type 2 diabetes are very prevalent and are in large-part due to lifestyle choices. The Pritikin Eating Plan provides plenty of food and hunger-curbing satisfaction. It is easy to follow, affordable, and helps reduce health risks.  Menu Workshop  Clinical staff conducted group or individual video education with verbal and written material and guidebook.  Patient learns that restaurant meals can sabotage health goals because they are often packed with calories, fat, sodium, and sugar. Recommendations include strategies to plan ahead and to communicate with the manager, chef, or server to help order a healthier meal.  Planning Your Eating Strategy  Clinical staff conducted group or individual video education with verbal and written material and guidebook.  Patient learns about the Pritikin Eating Plan and its benefit of reducing the  risk of disease. The Pritikin Eating Plan does not focus on calories. Instead, it emphasizes high-quality, nutrient-rich foods. By knowing the characteristics of the foods, we choose, we can determine their calorie density and make informed decisions.  Targeting Your Nutrition Priorities  Clinical staff conducted group or individual video education with verbal and written material and guidebook.  Patient learns that lifestyle habits have a tremendous impact on disease risk and progression. This video provides eating and physical activity recommendations based on your personal health goals, such as reducing LDL cholesterol, losing weight, preventing or controlling type 2 diabetes, and reducing high blood pressure.  Vitamins and Minerals  Clinical staff conducted group or individual video education with verbal and written material and guidebook.  Patient learns different ways to obtain key vitamins and minerals, including through a recommended healthy diet. It is important to discuss all supplements you take with your doctor.   Healthy Mind-Set    Smoking Cessation  Clinical staff conducted group or individual video education with verbal and written material and guidebook.  Patient learns that cigarette smoking and tobacco addiction pose a serious health risk which affects millions of people. Stopping smoking will significantly reduce the risk of heart disease, lung disease, and many forms of cancer. Recommended strategies for quitting are covered, including working with your doctor to develop a successful plan.  Culinary   Becoming a Set designer conducted group or individual video education with verbal and written material and guidebook.  Patient learns that cooking at home can be healthy, cost-effective, quick, and puts them in control. Keys to cooking healthy  recipes will include looking at your recipe, assessing your equipment needs, planning ahead, making it simple, choosing  cost-effective seasonal ingredients, and limiting the use of added fats, salts, and sugars.  Cooking - Breakfast and Snacks  Clinical staff conducted group or individual video education with verbal and written material and guidebook.  Patient learns how important breakfast is to satiety and nutrition through the entire day. Recommendations include key foods to eat during breakfast to help stabilize blood sugar levels and to prevent overeating at meals later in the day. Planning ahead is also a key component.  Cooking - Educational psychologist conducted group or individual video education with verbal and written material and guidebook.  Patient learns eating strategies to improve overall health, including an approach to cook more at home. Recommendations include thinking of animal protein as a side on your plate rather than center stage and focusing instead on lower calorie dense options like vegetables, fruits, whole grains, and plant-based proteins, such as beans. Making sauces in large quantities to freeze for later and leaving the skin on your vegetables are also recommended to maximize your experience.  Cooking - Healthy Salads and Dressing Clinical staff conducted group or individual video education with verbal and written material and guidebook.  Patient learns that vegetables, fruits, whole grains, and legumes are the foundations of the Pritikin Eating Plan. Recommendations include how to incorporate each of these in flavorful and healthy salads, and how to create homemade salad dressings. Proper handling of ingredients is also covered. Cooking - Soups and [employer] - Soups and Desserts Clinical staff conducted group or individual video education with verbal and written material and guidebook.  Patient learns that Pritikin soups and desserts make for easy, nutritious, and delicious snacks and meal components that are low in sodium, fat, sugar, and calorie density, while high in  vitamins, minerals, and filling fiber. Recommendations include simple and healthy ideas for soups and desserts.   Overview     The Pritikin Solution Program Overview Clinical staff conducted group or individual video education with verbal and written material and guidebook.  Patient learns that the results of the Pritikin Program have been documented in more than 100 articles published in peer-reviewed journals, and the benefits include reducing risk factors for (and, in some cases, even reversing) high cholesterol, high blood pressure, type 2 diabetes, obesity, and more! An overview of the three key pillars of the Pritikin Program will be covered: eating well, doing regular exercise, and having a healthy mind-set.  WORKSHOPS  Exercise: Exercise Basics: Building Your Action Plan Clinical staff led group instruction and group discussion with PowerPoint presentation and patient guidebook. To enhance the learning environment the use of posters, models and videos may be added. At the conclusion of this workshop, patients will comprehend the difference between physical activity and exercise, as well as the benefits of incorporating both, into their routine. Patients will understand the FITT (Frequency, Intensity, Time, and Type) principle and how to use it to build an exercise action plan. In addition, safety concerns and other considerations for exercise and cardiac rehab will be addressed by the presenter. The purpose of this lesson is to promote a comprehensive and effective weekly exercise routine in order to improve patients' overall level of fitness.   Managing Heart Disease: Your Path to a Healthier Heart Clinical staff led group instruction and group discussion with PowerPoint presentation and patient guidebook. To enhance the learning environment the use of posters, models and  videos may be added.At the conclusion of this workshop, patients will understand the anatomy and physiology of the  heart. Additionally, they will understand how Pritikin's three pillars impact the risk factors, the progression, and the management of heart disease.  The purpose of this lesson is to provide a high-level overview of the heart, heart disease, and how the Pritikin lifestyle positively impacts risk factors.  Exercise Biomechanics Clinical staff led group instruction and group discussion with PowerPoint presentation and patient guidebook. To enhance the learning environment the use of posters, models and videos may be added. Patients will learn how the structural parts of their bodies function and how these functions impact their daily activities, movement, and exercise. Patients will learn how to promote a neutral spine, learn how to manage pain, and identify ways to improve their physical movement in order to promote healthy living. The purpose of this lesson is to expose patients to common physical limitations that impact physical activity. Participants will learn practical ways to adapt and manage aches and pains, and to minimize their effect on regular exercise. Patients will learn how to maintain good posture while sitting, walking, and lifting.  Balance Training and Fall Prevention  Clinical staff led group instruction and group discussion with PowerPoint presentation and patient guidebook. To enhance the learning environment the use of posters, models and videos may be added. At the conclusion of this workshop, patients will understand the importance of their sensorimotor skills (vision, proprioception, and the vestibular system) in maintaining their ability to balance as they age. Patients will apply a variety of balancing exercises that are appropriate for their current level of function. Patients will understand the common causes for poor balance, possible solutions to these problems, and ways to modify their physical environment in order to minimize their fall risk. The purpose of this  lesson is to teach patients about the importance of maintaining balance as they age and ways to minimize their risk of falling.  WORKSHOPS   Nutrition:  Fueling a Ship broker led group instruction and group discussion with PowerPoint presentation and patient guidebook. To enhance the learning environment the use of posters, models and videos may be added. Patients will review the foundational principles of the Pritikin Eating Plan and understand what constitutes a serving size in each of the food groups. Patients will also learn Pritikin-friendly foods that are better choices when away from home and review make-ahead meal and snack options. Calorie density will be reviewed and applied to three nutrition priorities: weight maintenance, weight loss, and weight gain. The purpose of this lesson is to reinforce (in a group setting) the key concepts around what patients are recommended to eat and how to apply these guidelines when away from home by planning and selecting Pritikin-friendly options. Patients will understand how calorie density may be adjusted for different weight management goals.  Mindful Eating  Clinical staff led group instruction and group discussion with PowerPoint presentation and patient guidebook. To enhance the learning environment the use of posters, models and videos may be added. Patients will briefly review the concepts of the Pritikin Eating Plan and the importance of low-calorie dense foods. The concept of mindful eating will be introduced as well as the importance of paying attention to internal hunger signals. Triggers for non-hunger eating and techniques for dealing with triggers will be explored. The purpose of this lesson is to provide patients with the opportunity to review the basic principles of the Pritikin Eating Plan, discuss the value of  eating mindfully and how to measure internal cues of hunger and fullness using the Hunger Scale. Patients will also  discuss reasons for non-hunger eating and learn strategies to use for controlling emotional eating.  Targeting Your Nutrition Priorities Clinical staff led group instruction and group discussion with PowerPoint presentation and patient guidebook. To enhance the learning environment the use of posters, models and videos may be added. Patients will learn how to determine their genetic susceptibility to disease by reviewing their family history. Patients will gain insight into the importance of diet as part of an overall healthy lifestyle in mitigating the impact of genetics and other environmental insults. The purpose of this lesson is to provide patients with the opportunity to assess their personal nutrition priorities by looking at their family history, their own health history and current risk factors. Patients will also be able to discuss ways of prioritizing and modifying the Pritikin Eating Plan for their highest risk areas  Menu  Clinical staff led group instruction and group discussion with PowerPoint presentation and patient guidebook. To enhance the learning environment the use of posters, models and videos may be added. Using menus brought in from [employer], or printed from [employer], patients will apply the Pritikin dining out guidelines that were presented in the [employer] video. Patients will also be able to practice these guidelines in a variety of provided scenarios. The purpose of this lesson is to provide patients with the opportunity to practice hands-on learning of the Pritikin Dining Out guidelines with actual menus and practice scenarios.  Label Reading Clinical staff led group instruction and group discussion with PowerPoint presentation and patient guidebook. To enhance the learning environment the use of posters, models and videos may be added. Patients will review and discuss the Pritikin label reading guidelines presented in Pritikin's Label Reading  Educational series video. Using fool labels brought in from local grocery stores and markets, patients will apply the label reading guidelines and determine if the packaged food meet the Pritikin guidelines. The purpose of this lesson is to provide patients with the opportunity to review, discuss, and practice hands-on learning of the Pritikin Label Reading guidelines with actual packaged food labels. Cooking School  Pritikin's [employer] are designed to teach patients ways to prepare quick, simple, and affordable recipes at home. The importance of nutrition's role in chronic disease risk reduction is reflected in its emphasis in the overall Pritikin program. By learning how to prepare essential core Pritikin Eating Plan recipes, patients will increase control over what they eat; be able to customize the flavor of foods without the use of added salt, sugar, or fat; and improve the quality of the food they consume. By learning a set of core recipes which are easily assembled, quickly prepared, and affordable, patients are more likely to prepare more healthy foods at home. These workshops focus on convenient breakfasts, simple entres, side dishes, and desserts which can be prepared with minimal effort and are consistent with nutrition recommendations for cardiovascular risk reduction. Cooking [employer] are taught by a Armed forces logistics/support/administrative officer (RD) who has been trained by the [employer]. The chef or RD has a clear understanding of the importance of minimizing - if not completely eliminating - added fat, sugar, and sodium in recipes. Throughout the series of Cooking School Workshop sessions, patients will learn about healthy ingredients and efficient methods of cooking to build confidence in their capability to prepare    [employer] weekly  topics:  Adding Flavor- Sodium-Free  Fast and Healthy Breakfasts  Powerhouse Plant-Based Proteins  Satisfying Salads and  Dressings  Simple Sides and Sauces  International Cuisine-Spotlight on the Blue Zones  Delicious Desserts  Savory Soups  Efficiency Cooking - Meals in a Snap  Tasty Appetizers and Snacks  Comforting Weekend Breakfasts  One-Pot Wonders   Fast Evening Meals  Landscape architect Your Pritikin Plate  WORKSHOPS   Healthy Mindset (Psychosocial):  Focused Goals, Sustainable Changes Clinical staff led group instruction and group discussion with PowerPoint presentation and patient guidebook. To enhance the learning environment the use of posters, models and videos may be added. Patients will be able to apply effective goal setting strategies to establish at least one personal goal, and then take consistent, meaningful action toward that goal. They will learn to identify common barriers to achieving personal goals and develop strategies to overcome them. Patients will also gain an understanding of how our mind-set can impact our ability to achieve goals and the importance of cultivating a positive and growth-oriented mind-set. The purpose of this lesson is to provide patients with a deeper understanding of how to set and achieve personal goals, as well as the tools and strategies needed to overcome common obstacles which may arise along the way.  From Head to Heart: The Power of a Healthy Outlook  Clinical staff led group instruction and group discussion with PowerPoint presentation and patient guidebook. To enhance the learning environment the use of posters, models and videos may be added. Patients will be able to recognize and describe the impact of emotions and mood on physical health. They will discover the importance of self-care and explore self-care practices which may work for them. Patients will also learn how to utilize the 4 C's to cultivate a healthier outlook and better manage stress and challenges. The purpose of this lesson is to demonstrate to patients how a healthy outlook  is an essential part of maintaining good health, especially as they continue their cardiac rehab journey.  Healthy Sleep for a Healthy Heart Clinical staff led group instruction and group discussion with PowerPoint presentation and patient guidebook. To enhance the learning environment the use of posters, models and videos may be added. At the conclusion of this workshop, patients will be able to demonstrate knowledge of the importance of sleep to overall health, well-being, and quality of life. They will understand the symptoms of, and treatments for, common sleep disorders. Patients will also be able to identify daytime and nighttime behaviors which impact sleep, and they will be able to apply these tools to help manage sleep-related challenges. The purpose of this lesson is to provide patients with a general overview of sleep and outline the importance of quality sleep. Patients will learn about a few of the most common sleep disorders. Patients will also be introduced to the concept of "sleep hygiene," and discover ways to self-manage certain sleeping problems through simple daily behavior changes. Finally, the workshop will motivate patients by clarifying the links between quality sleep and their goals of heart-healthy living.   Recognizing and Reducing Stress Clinical staff led group instruction and group discussion with PowerPoint presentation and patient guidebook. To enhance the learning environment the use of posters, models and videos may be added. At the conclusion of this workshop, patients will be able to understand the types of stress reactions, differentiate between acute and chronic stress, and recognize the impact that chronic stress has on their health. They will also be able  to apply different coping mechanisms, such as reframing negative self-talk. Patients will have the opportunity to practice a variety of stress management techniques, such as deep abdominal breathing, progressive muscle  relaxation, and/or guided imagery.  The purpose of this lesson is to educate patients on the role of stress in their lives and to provide healthy techniques for coping with it.  Learning Barriers/Preferences:  Learning Barriers/Preferences - 01/29/23 1328       Learning Barriers/Preferences   Learning Barriers Sight   Decreased vision needs cataract surgery, uses a cane has issues with chronic pain lower back and both knees   Learning Preferences None   No learning preferences were indicated            Education Topics:  Knowledge Questionnaire Score:  Knowledge Questionnaire Score - 04/05/23 1450       Knowledge Questionnaire Score   Post Score 23/24             Core Components/Risk Factors/Patient Goals at Admission:  Personal Goals and Risk Factors at Admission - 01/29/23 1242       Core Components/Risk Factors/Patient Goals on Admission    Weight Management Yes;Obesity;Weight Loss    Intervention Weight Management/Obesity: Establish reasonable short term and long term weight goals.;Obesity: Provide education and appropriate resources to help participant work on and attain dietary goals.    Admit Weight 173 lb 4.5 oz (78.6 kg)    Expected Outcomes Short Term: Continue to assess and modify interventions until short term weight is achieved;Long Term: Adherence to nutrition and physical activity/exercise program aimed toward attainment of established weight goal;Weight Loss: Understanding of general recommendations for a balanced deficit meal plan, which promotes 1-2 lb weight loss per week and includes a negative energy balance of [phone] kcal/d    Diabetes Yes    Intervention Provide education about signs/symptoms and action to take for hypo/hyperglycemia.;Provide education about proper nutrition, including hydration, and aerobic/resistive exercise prescription along with prescribed medications to achieve blood glucose in normal ranges: Fasting glucose 65-99 mg/dL     Expected Outcomes Short Term: Participant verbalizes understanding of the signs/symptoms and immediate care of hyper/hypoglycemia, proper foot care and importance of medication, aerobic/resistive exercise and nutrition plan for blood glucose control.;Long Term: Attainment of HbA1C < 7%.    Hypertension Yes    Intervention Provide education on lifestyle modifcations including regular physical activity/exercise, weight management, moderate sodium restriction and increased consumption of fresh fruit, vegetables, and low fat dairy, alcohol moderation, and smoking cessation.;Monitor prescription use compliance.    Expected Outcomes Short Term: Continued assessment and intervention until BP is < 140/22mm HG in hypertensive participants. < 130/21mm HG in hypertensive participants with diabetes, heart failure or chronic kidney disease.;Long Term: Maintenance of blood pressure at goal levels.    Lipids Yes    Intervention Provide education and support for participant on nutrition & aerobic/resistive exercise along with prescribed medications to achieve LDL 70mg , HDL >40mg .    Expected Outcomes Short Term: Participant states understanding of desired cholesterol values and is compliant with medications prescribed. Participant is following exercise prescription and nutrition guidelines.;Long Term: Cholesterol controlled with medications as prescribed, with individualized exercise RX and with personalized nutrition plan. Value goals: LDL < 70mg , HDL > 40 mg.    Stress Yes    Intervention Offer individual and/or small group education and counseling on adjustment to heart disease, stress management and health-related lifestyle change. Teach and support self-help strategies.;Refer participants experiencing significant psychosocial distress to appropriate mental health specialists for  further evaluation and treatment. When possible, include family members and significant others in education/counseling sessions.    Expected  Outcomes Short Term: Participant demonstrates changes in health-related behavior, relaxation and other stress management skills, ability to obtain effective social support, and compliance with psychotropic medications if prescribed.;Long Term: Emotional wellbeing is indicated by absence of clinically significant psychosocial distress or social isolation.             Core Components/Risk Factors/Patient Goals Review:   Goals and Risk Factor Review     Row Name 02/04/23 1713 02/14/23 1408 03/12/23 1449 04/09/23 1155       Core Components/Risk Factors/Patient Goals Review   Personal Goals Review Weight Management/Obesity;Hypertension;Lipids;Diabetes;Stress Weight Management/Obesity;Hypertension;Lipids;Diabetes;Stress Weight Management/Obesity;Hypertension;Lipids;Diabetes;Stress Weight Management/Obesity;Hypertension;Lipids;Diabetes;Stress    Review Angel Reynolds started intensive cardiac rehab on 02/04/23 and did fair with exercise for her fitness level. Vital signs and CBG's were stable Angel Reynolds had chest pain on 02/13/23 walking into intensive cardiac rehab. Exercise held. 12 lead obtained per onsite provider. Dr Lowella Curb office was notified. Angel Reynolds is doing well with exercise at  intensive cardiac rehab for her fitness level. Druanne has not had any more reports of Angina. CBG's have been stable Tamia is doing well with exercise at  intensive cardiac rehab for her fitness level. Cleotilde has been doing well. CBG's have been stable. Katelinn will complete intensive cardiac rehab on 04/12/23    Expected Outcomes Niana will continue to participate in intensive cardiac rehab for exercise, nutrition and lifestyle modifications Cyre will continue to participate in intensive cardiac rehab for exercise, nutrition and lifestyle modifications Andriette will continue to participate in intensive cardiac rehab for exercise, nutrition and lifestyle modifications Francetta will continue to participate in intensive cardiac rehab for exercise,  nutrition and lifestyle modifications             Core Components/Risk Factors/Patient Goals at Discharge (Final Review):   Goals and Risk Factor Review - 04/09/23 1155       Core Components/Risk Factors/Patient Goals Review   Personal Goals Review Weight Management/Obesity;Hypertension;Lipids;Diabetes;Stress    Review Kenecia is doing well with exercise at  intensive cardiac rehab for her fitness level. Yleana has been doing well. CBG's have been stable. Riah will complete intensive cardiac rehab on 04/12/23    Expected Outcomes Simran will continue to participate in intensive cardiac rehab for exercise, nutrition and lifestyle modifications             ITP Comments:  ITP Comments     Row Name 01/29/23 1159 02/04/23 1709 03/12/23 1440 04/09/23 1147     ITP Comments Introduction to Pritikin Education Program/Intensive Cardiac Rehab. Initial Orientation packet Reviewed with the patient 30 Day ITP Review. Kerina started intensive cardiac rehab on 02/04/23. Koty did fair with exercise as she is deconditioned. 30 Day ITP Review. Tennie has good attendance and participation in  intensive cardiac rehab. Nydia is enjoying participating in the program. 30 Day ITP Review. Sunshyne has good attendance and participation in  intensive cardiac rehab. Camrin is enjoying participating in the program. Hartleigh will complete intensive cardiac rehab on 04/12/23             Comments: See ITP Comments

## 2023-04-10 ENCOUNTER — Encounter (HOSPITAL_COMMUNITY)
Admission: RE | Admit: 2023-04-10 | Discharge: 2023-04-10 | Disposition: A | Discharge status: Home / Self Care | Payer: Medicare HMO | Source: Ambulatory Visit | Attending: Cardiology | Admitting: Cardiology

## 2023-04-10 DIAGNOSIS — Z955 Presence of coronary angioplasty implant and graft: Secondary | ICD-10-CM | POA: Diagnosis not present

## 2023-04-12 ENCOUNTER — Encounter (HOSPITAL_COMMUNITY)
Admission: RE | Admit: 2023-04-12 | Discharge: 2023-04-12 | Disposition: A | Discharge status: Home / Self Care | Payer: Medicare HMO | Source: Ambulatory Visit | Attending: Cardiology | Admitting: Cardiology

## 2023-04-12 VITALS — BP 140/60 | HR 63 | Ht 63.25 in | Wt 179.9 lb

## 2023-04-12 DIAGNOSIS — E162 Hypoglycemia, unspecified: Secondary | ICD-10-CM

## 2023-04-12 DIAGNOSIS — Z955 Presence of coronary angioplasty implant and graft: Secondary | ICD-10-CM

## 2023-04-12 DIAGNOSIS — R079 Chest pain, unspecified: Secondary | ICD-10-CM

## 2023-04-12 NOTE — Progress Notes (Signed)
Discharge Progress Report  Angel Reynolds Details  Name: Angel Reynolds MRN: [id number] Date of Birth: [birth year] Referring Provider:   Flowsheet Row INTENSIVE CARDIAC REHAB ORIENT from 01/29/2023 in [facility]  Referring Provider Georgeanna Lea, MD        Number of Visits: 34  Reason for Discharge:  Angel Reynolds reached a stable level of exercise. Angel Reynolds has met program and personal goals.  Smoking History:  Social History   Tobacco Use  Smoking Status Former   Types: E-cigarettes, Cigarettes  Smokeless Tobacco Never  Tobacco Comments   Stopped vaping in 2023    Diagnosis:  12/11/22 S/P DES Mid LAD  Chest pain, unspecified type  Hypoglycemia  ADL UCSD:   Initial Exercise Prescription:  Initial Exercise Prescription - 01/29/23 1200       Date of Initial Exercise RX and Referring Provider   Date 01/29/23    Referring Provider Georgeanna Lea, MD    Expected Discharge Date 04/12/23      NuStep   Level 1    SPM 70    Minutes 20    METs 1.5      Prescription Details   Frequency (times per week) 3    Duration Progress to 30 minutes of continuous aerobic without signs/symptoms of physical distress      Intensity   THRR 40-80% of Max Heartrate 61-122    Ratings of Perceived Exertion 11-13    Perceived Dyspnea 0-4      Progression   Progression Continue to progress workloads to maintain intensity without signs/symptoms of physical distress.      Resistance Training   Training Prescription Yes    Weight 2 lbs    Reps 10-15             Discharge Exercise Prescription (Final Exercise Prescription Changes):  Exercise Prescription Changes - 04/12/23 1022       Response to Exercise   Blood Pressure (Admit) 140/60    Blood Pressure (Exercise) 142/64    Blood Pressure (Exit) 138/56    Heart Rate (Admit) 63 bpm    Heart Rate (Exercise) 97 bpm    Heart Rate (Exit) 72 bpm    Rating of  Perceived Exertion (Exercise) 9    Symptoms None    Comments Angel Reynolds completed the cardiac rehab program today.    Duration Progress to 30 minutes of  aerobic without signs/symptoms of physical distress    Intensity THRR unchanged      Progression   Progression Continue to progress workloads to maintain intensity without signs/symptoms of physical distress.    Average METs 1.9      Resistance Training   Training Prescription Yes    Weight 2 lbs    Reps 10-15    Time 10 Minutes      Interval Training   Interval Training No      NuStep   Level 2    SPM 92    Minutes 25    METs 1.9      Home Exercise Plan   Plans to continue exercise at Home (comment)   Slim cycle at home   Frequency Add 2 additional days to program exercise sessions.    Initial Home Exercises Provided 03/15/23             Functional Capacity:  6 Minute Walk     Row Name 01/29/23 1237 04/03/23 1201       6  Minute Walk   Phase Initial Discharge    Distance 472 feet 796 feet    Distance % Change -- 68.64 %    Distance Feet Change -- 324 ft    Walk Time 4.85 minutes 6 minutes    # of Rest Breaks 1  Seated for 1.15 minutes 1  47 seconds    MPH 1.1 1.51    METS 1.52 1.99    RPE 12 10    Perceived Dyspnea  0 2    VO2 Peak 5.31 6.99    Symptoms Yes (comment) Yes (comment)    Comments Low back, bilateral knee pain-chronic, 6/10 on pain scale. BL knee pain 5/10    Resting HR 70 bpm 62 bpm    Resting BP 128/58 136/60    Resting Oxygen Saturation  98 % --    Exercise Oxygen Saturation  during 6 min walk 97 % --    Max Ex. HR 93 bpm 97 bpm    Max Ex. BP 166/62 154/68    2 Minute Post BP 122/58 --             Psychological, QOL, Others - Outcomes: PHQ 2/9:    04/12/2023   11:27 AM 01/29/2023    1:40 PM  Depression screen PHQ 2/9  Decreased Interest 2 1  Down, Depressed, Hopeless 0 0  PHQ - 2 Score 2 1  Altered sleeping 1 2  Tired, decreased energy 1 3  Change in appetite 1 2  Feeling  bad or failure about yourself  0 0  Trouble concentrating 0 1  Moving slowly or fidgety/restless 0 0  Suicidal thoughts 0 0  PHQ-9 Score 5 9  Difficult doing work/chores Not difficult at all Somewhat difficult    Quality of Life:  Quality of Life - 04/05/23 1449       Quality of Life   Select Quality of Life      Quality of Life Scores   Health/Function Pre 19.1 %    Health/Function Post 20.77 %    Health/Function % Change 8.74 %    Socioeconomic Pre 18.33 %    Socioeconomic Post 20.43 %    Socioeconomic % Change  11.46 %    Psych/Spiritual Pre 16.79 %    Psych/Spiritual Post 22 %    Psych/Spiritual % Change 31.03 %    Family Pre 22.8 %    Family Post 18.1 %    Family % Change -20.61 %    GLOBAL Pre 19.03 %    GLOBAL Post 20.52 %    GLOBAL % Change 7.83 %             Personal Goals: Goals established at orientation with interventions provided to work toward goal.  Personal Goals and Risk Factors at Admission - 01/29/23 1242       Core Components/Risk Factors/Angel Reynolds Goals on Admission    Weight Management Yes;Obesity;Weight Loss    Intervention Weight Management/Obesity: Establish reasonable short term and long term weight goals.;Obesity: Provide education and appropriate resources to help participant work on and attain dietary goals.    Admit Weight 173 lb 4.5 oz (78.6 kg)    Expected Outcomes Short Term: Continue to assess and modify interventions until short term weight is achieved;Long Term: Adherence to nutrition and physical activity/exercise program aimed toward attainment of established weight goal;Weight Loss: Understanding of general recommendations for a balanced deficit meal plan, which promotes 1-2 lb weight loss per week and includes a negative energy  balance of [phone] kcal/d    Diabetes Yes    Intervention Provide education about signs/symptoms and action to take for hypo/hyperglycemia.;Provide education about proper nutrition, including hydration,  and aerobic/resistive exercise prescription along with prescribed medications to achieve blood glucose in normal ranges: Fasting glucose 65-99 mg/dL    Expected Outcomes Short Term: Participant verbalizes understanding of the signs/symptoms and immediate care of hyper/hypoglycemia, proper foot care and importance of medication, aerobic/resistive exercise and nutrition plan for blood glucose control.;Long Term: Attainment of HbA1C < 7%.    Hypertension Yes    Intervention Provide education on lifestyle modifcations including regular physical activity/exercise, weight management, moderate sodium restriction and increased consumption of fresh fruit, vegetables, and low fat dairy, alcohol moderation, and smoking cessation.;Monitor prescription use compliance.    Expected Outcomes Short Term: Continued assessment and intervention until BP is < 140/64mm HG in hypertensive participants. < 130/42mm HG in hypertensive participants with diabetes, heart failure or chronic kidney disease.;Long Term: Maintenance of blood pressure at goal levels.    Lipids Yes    Intervention Provide education and support for participant on nutrition & aerobic/resistive exercise along with prescribed medications to achieve LDL 70mg , HDL >40mg .    Expected Outcomes Short Term: Participant states understanding of desired cholesterol values and is compliant with medications prescribed. Participant is following exercise prescription and nutrition guidelines.;Long Term: Cholesterol controlled with medications as prescribed, with individualized exercise RX and with personalized nutrition plan. Value goals: LDL < 70mg , HDL > 40 mg.    Stress Yes    Intervention Offer individual and/or small group education and counseling on adjustment to heart disease, stress management and health-related lifestyle change. Teach and support self-help strategies.;Refer participants experiencing significant psychosocial distress to appropriate mental health  specialists for further evaluation and treatment. When possible, include family members and significant others in education/counseling sessions.    Expected Outcomes Short Term: Participant demonstrates changes in health-related behavior, relaxation and other stress management skills, ability to obtain effective social support, and compliance with psychotropic medications if prescribed.;Long Term: Emotional wellbeing is indicated by absence of clinically significant psychosocial distress or social isolation.              Personal Goals Discharge:  Goals and Risk Factor Review     Row Name 02/04/23 1713 02/14/23 1408 03/12/23 1449 04/09/23 1155       Core Components/Risk Factors/Angel Reynolds Goals Review   Personal Goals Review Weight Management/Obesity;Hypertension;Lipids;Diabetes;Stress Weight Management/Obesity;Hypertension;Lipids;Diabetes;Stress Weight Management/Obesity;Hypertension;Lipids;Diabetes;Stress Weight Management/Obesity;Hypertension;Lipids;Diabetes;Stress    Review Angel Reynolds started intensive cardiac rehab on 02/04/23 and did fair with exercise for her fitness level. Vital signs and CBG's were stable Angel Reynolds had chest pain on 02/13/23 walking into intensive cardiac rehab. Exercise held. 12 lead obtained per onsite provider. Dr Lowella Curb office was notified. Angel Reynolds is doing well with exercise at  intensive cardiac rehab for her fitness level. Angel Reynolds has not had any more reports of Angina. CBG's have been stable Angel Reynolds is doing well with exercise at  intensive cardiac rehab for her fitness level. Angel Reynolds has been doing well. CBG's have been stable. Angel Reynolds will complete intensive cardiac rehab on 04/12/23    Expected Outcomes Angel Reynolds will continue to participate in intensive cardiac rehab for exercise, nutrition and lifestyle modifications Angel Reynolds will continue to participate in intensive cardiac rehab for exercise, nutrition and lifestyle modifications Angel Reynolds will continue to participate in intensive cardiac rehab  for exercise, nutrition and lifestyle modifications Angel Reynolds will continue to participate in intensive cardiac rehab for exercise, nutrition and lifestyle modifications  Exercise Goals and Review:  Exercise Goals     Row Name 01/29/23 1135             Exercise Goals   Increase Physical Activity Yes       Intervention Provide advice, education, support and counseling about physical activity/exercise needs.;Develop an individualized exercise prescription for aerobic and resistive training based on initial evaluation findings, risk stratification, comorbidities and participant's personal goals.       Expected Outcomes Short Term: Attend rehab on a regular basis to increase amount of physical activity.;Long Term: Add in home exercise to make exercise part of routine and to increase amount of physical activity.;Long Term: Exercising regularly at least 3-5 days a week.       Increase Strength and Stamina Yes       Intervention Provide advice, education, support and counseling about physical activity/exercise needs.;Develop an individualized exercise prescription for aerobic and resistive training based on initial evaluation findings, risk stratification, comorbidities and participant's personal goals.       Expected Outcomes Short Term: Increase workloads from initial exercise prescription for resistance, speed, and METs.;Short Term: Perform resistance training exercises routinely during rehab and add in resistance training at home;Long Term: Improve cardiorespiratory fitness, muscular endurance and strength as measured by increased METs and functional capacity ( )       Able to understand and use rate of perceived exertion (RPE) scale Yes       Intervention Provide education and explanation on how to use RPE scale       Expected Outcomes Short Term: Able to use RPE daily in rehab to express subjective intensity level;Long Term:  Able to use RPE to guide intensity level when exercising  independently       Knowledge and understanding of Target Heart Rate Range (THRR) Yes       Intervention Provide education and explanation of THRR including how the numbers were predicted and where they are located for reference       Expected Outcomes Short Term: Able to state/look up THRR;Long Term: Able to use THRR to govern intensity when exercising independently;Short Term: Able to use daily as guideline for intensity in rehab       Able to check pulse independently Yes       Intervention Provide education and demonstration on how to check pulse in carotid and radial arteries.;Review the importance of being able to check your own pulse for safety during independent exercise       Expected Outcomes Short Term: Able to explain why pulse checking is important during independent exercise;Long Term: Able to check pulse independently and accurately       Understanding of Exercise Prescription Yes       Intervention Provide education, explanation, and written materials on Angel Reynolds's individual exercise prescription       Expected Outcomes Short Term: Able to explain program exercise prescription;Long Term: Able to explain home exercise prescription to exercise independently                Exercise Goals Re-Evaluation:  Exercise Goals Re-Evaluation     Row Name 02/04/23 1130 02/18/23 1053 03/15/23 1042 04/08/23 1047 04/12/23 1130     Exercise Goal Re-Evaluation   Exercise Goals Review Increase Physical Activity;Increase Strength and Stamina;Able to understand and use rate of perceived exertion (RPE) scale Increase Physical Activity;Increase Strength and Stamina;Able to understand and use rate of perceived exertion (RPE) scale Increase Physical Activity;Increase Strength and Stamina;Able to understand and use rate of  perceived exertion (RPE) scale;Understanding of Exercise Prescription;Able to check pulse independently;Knowledge and understanding of Target Heart Rate Range (THRR) Increase Physical  Activity;Increase Strength and Stamina;Able to understand and use rate of perceived exertion (RPE) scale;Understanding of Exercise Prescription;Able to check pulse independently;Knowledge and understanding of Target Heart Rate Range (THRR) Increase Physical Activity;Increase Strength and Stamina;Able to understand and use rate of perceived exertion (RPE) scale;Understanding of Exercise Prescription;Able to check pulse independently;Knowledge and understanding of Target Heart Rate Range (THRR)   Comments Angel Reynolds able to understand and use RPE scale appropriately. Angel Reynolds is limited by knee pain. Per Angel Reynolds both knees bone on bone, and she needs knee replacement. Her goal is to be able to get around better. Angel Reynolds is walking her dog and has a Slim cycle that she's not currently using but can incorporate into her routine. Reviewed exercise prescription with Angel Reynolds. Angel Reynolds's goal is to increase her energy level. Angel Reynolds is able to manually check her pulse. She plans to get her Slim cycle out this weekend, so that she can use that as her mode of exercise at home. Angel Reynolds will complete cardiac rehab program this week and has made gradual progress. She plans to continue exercise at home using her Slim Cycle. Angel Reynolds completed the cardiac rehab program today. She will continue exercise at home at this time.   Expected Outcomes Progress workloads as tolerated to help increase strength and stamina. Continue to progress workloads as tolerated. Add 1 -2 days walking at home, taking rest breaks as tolerated. Angel Reynolds will exercise on her Slim cycle 1-2 days/week in addition to exercise at cardiac rehab. Angel Reynolds will continue exercise at home on her Slim Cycle upon completion of the cardiac rehab program. Angel Reynolds will exercise using her Slim Cycle 25-30 minutes, 3 days/week.            Nutrition & Weight - Outcomes:  Pre Biometrics - 01/29/23 1157       Pre Biometrics   Waist Circumference 44.5 inches    Hip  Circumference 46.25 inches    Waist to Hip Ratio 0.96 %    Triceps Skinfold 22 mm    % Body Fat 42.8 %    Grip Strength 16 kg    Flexibility --   Not performed, chronic back pain.   Single Leg Stand 0.87 seconds             Post Biometrics - 04/12/23 1034        Post  Biometrics   Height 5' 3.25" (1.607 m)    Waist Circumference 46.5 inches    Hip Circumference 47 inches    Waist to Hip Ratio 0.99 %    Triceps Skinfold 22 mm    % Body Fat 44.1 %    Grip Strength 18 kg    Flexibility --   not performed   Single Leg Stand 3.5 seconds             Nutrition:  Nutrition Therapy & Goals - 04/04/23 0904       Nutrition Therapy   Diet Heart healthy/carbohydrate consistent diet    Drug/Food Interactions Statins/Certain Fruits      Personal Nutrition Goals   Nutrition Goal Angel Reynolds to identify strategies for reducing cardiovascular risk by attending the [employer] education and nutrition series weekly.    Personal Goal #2 Angel Reynolds to improve diet quality by using the plate method as a guide for meal planning to include lean protein/plant protein, fruits, vegetables, whole grains, nonfat dairy as part of  a well-balanced diet.    Personal Goal #3 Angel Reynolds to reduce sodium to 1500mg  per day    Comments Angel Reynolds remains pre-contemplative toward dietary changes/lifestyle changes. She does not attend the [employer] education and nutrition series regularly. She reports being a "picky" eater and reports very little fruit/vegetable intake. Her husband does a lot of the cooking and is trying to make heart healthy choices. She is up 5.3# since starting with our program. Angel Reynolds will benefit from participation in intensive cardiac rehab for nutrition, exercise, and lifestyle modification.      Intervention Plan   Intervention Prescribe, educate and counsel regarding individualized specific dietary modifications aiming towards targeted core components such as weight, hypertension, lipid management,  diabetes, heart failure and other comorbidities.;Nutrition handout(s) given to Angel Reynolds.    Expected Outcomes Short Term Goal: Understand basic principles of dietary content, such as calories, fat, sodium, cholesterol and nutrients.;Long Term Goal: Adherence to prescribed nutrition plan.             Nutrition Discharge:  Nutrition Assessments - 04/08/23 0905       Rate Your Plate Scores   Post Score 65             Education Questionnaire Score:  Knowledge Questionnaire Score - 04/05/23 1450       Knowledge Questionnaire Score   Post Score 23/24             Goals reviewed with Angel Reynolds; copy given to Angel Reynolds.Pt graduates from  Intensive/Traditional cardiac rehab program on 04/12/23  with completion of  26 exercise and  8 education sessions. Pt maintained good attendance and progressed nicely during their participation in rehab as evidenced by increased MET level. Angel Reynolds increased her distance on her post exercise walk test by 324 feet.  Medication list reconciled. Repeat  PHQ score- 5 .  Pt has made significant lifestyle changes and should be commended for their success. Angel Reynolds did gain 3 kg while in the program. Angel Reynolds  achieved their goals during cardiac rehab.   Pt plans to continue exercise at home using her slim cycle.  Angel Reynolds said participating in cardiac rehab was helpful. We are proud of Angel Reynolds's progress!Thayer Headings RN BSN

## 2023-04-22 ENCOUNTER — Ambulatory Visit: Payer: Medicare HMO | Attending: Cardiology | Admitting: Cardiology

## 2023-04-22 ENCOUNTER — Encounter: Payer: Self-pay | Admitting: Cardiology

## 2023-04-22 VITALS — BP 120/68 | HR 58 | Ht 63.0 in | Wt 175.6 lb

## 2023-04-22 DIAGNOSIS — E1169 Type 2 diabetes mellitus with other specified complication: Secondary | ICD-10-CM | POA: Diagnosis not present

## 2023-04-22 DIAGNOSIS — Z7984 Long term (current) use of oral hypoglycemic drugs: Secondary | ICD-10-CM

## 2023-04-22 DIAGNOSIS — I10 Essential (primary) hypertension: Secondary | ICD-10-CM | POA: Diagnosis not present

## 2023-04-22 DIAGNOSIS — E782 Mixed hyperlipidemia: Secondary | ICD-10-CM | POA: Diagnosis not present

## 2023-04-22 DIAGNOSIS — I25118 Atherosclerotic heart disease of native coronary artery with other forms of angina pectoris: Secondary | ICD-10-CM

## 2023-04-22 DIAGNOSIS — E669 Obesity, unspecified: Secondary | ICD-10-CM

## 2023-04-22 DIAGNOSIS — I209 Angina pectoris, unspecified: Secondary | ICD-10-CM

## 2023-04-22 NOTE — Progress Notes (Signed)
Cardiology Office Note:    Date:  04/22/2023   ID:  Angel Reynolds, DOB 1956-05-17, MRN [id number]  PCP:  Simone Curia, MD  Cardiologist:  Gypsy Balsam, MD    Referring MD: Simone Curia, MD   Chief Complaint  Patient presents with   Follow-up    History of Present Illness:    Angel Reynolds is a 67 y.o. female  with past medical history significant for coronary artery disease few months ago she had cardiac catheterization done which showed 80 to 90% stenosis of proximal LAD, 90% obtuse marginal branch, completely occluded right coronary artery after cardiac catheterization she got some psychological/neurological event eventually it was identified that was probably withdrawal from Vicodin. She was evaluated for possibility of coronary bypass graft was placed and she was disqualified as candidate for it. She was brought back to cardiac cath laboratory in February 6 at that time she did have PTCA and stenting with drug-eluting stent to proximal LAD which was successful, however we were unable to cross circumflex. On top of that the patient became restless towards the end of procedure therefore procedure was aborted. Additional problem include dyslipidemia, essential hypertension, anemia.  Comes today to months for follow-up we will doing very well.  She denies any chest pain tightness squeezing pressure burning chest.  She completed her cardiac rehab program and continue exercising.  Since last seen her last time normal chest pain  Past Medical History:  Diagnosis Date   Anxiety    Benign essential hypertension    Chronic constipation    Coronary artery disease    Diabetes mellitus    AODM   Elevated liver enzymes    Hypercholesteremia    Hyperlipidemia    Lower back pain    Obesity    Osteoarthritis of left knee 01/06/2021   Peripheral neuropathy    Recurrent major depressive disorder in partial remission (HCC)    Type 2 diabetes mellitus with obesity (HCC)     Past Surgical  History:  Procedure Laterality Date   ABDOMINAL HYSTERECTOMY     CARDIAC CATHETERIZATION     CORONARY ATHERECTOMY N/A 12/11/2022   Procedure: CORONARY ATHERECTOMY;  Surgeon: Corky Crafts, MD;  Location: [facility] INVASIVE CV LAB;  Service: Cardiovascular;  Laterality: N/A;   CORONARY PRESSURE/FFR STUDY N/A 03/19/2022   Procedure: INTRAVASCULAR PRESSURE WIRE/FFR STUDY;  Surgeon: Lyn Records, MD;  Location: [facility] INVASIVE CV LAB;  Service: Cardiovascular;  Laterality: N/A;   LEFT HEART CATH AND CORONARY ANGIOGRAPHY N/A 03/19/2022   Procedure: LEFT HEART CATH AND CORONARY ANGIOGRAPHY;  Surgeon: Lyn Records, MD;  Location: [facility] INVASIVE CV LAB;  Service: Cardiovascular;  Laterality: N/A;   LEFT HEART CATH AND CORONARY ANGIOGRAPHY N/A 12/11/2022   Procedure: LEFT HEART CATH AND CORONARY ANGIOGRAPHY;  Surgeon: Corky Crafts, MD;  Location: [facility] INVASIVE CV LAB;  Service: Cardiovascular;  Laterality: N/A;    Current Medications: Current Meds  Medication Sig   aspirin 81 MG tablet Take 81 mg by mouth every evening.   atorvastatin (LIPITOR) 80 MG tablet Take 80 mg by mouth every evening.   Cholecalciferol 25 MCG (1000 UT) capsule Take 1,000 Units by mouth in the morning.   clopidogrel (PLAVIX) 75 MG tablet Take 1 tablet (75 mg total) by mouth daily.   clotrimazole-betamethasone (LOTRISONE) cream Apply 1 application. topically daily as needed (Eczema).   escitalopram (LEXAPRO) 10 MG tablet Take 10 mg by mouth every evening.   famotidine (PEPCID) 20 MG tablet Take  20 mg by mouth every 12 (twelve) hours as needed for heartburn or indigestion.   glimepiride (AMARYL) 4 MG tablet Take 4 mg by mouth daily with breakfast.   HYDROcodone-acetaminophen (NORCO/VICODIN) 5-325 MG tablet Take 1 tablet by mouth every 8 (eight) hours as needed for pain.   isosorbide mononitrate (IMDUR) 120 MG 24 hr tablet Take 1 tablet (120 mg total) by mouth daily.   lisinopril (ZESTRIL) 10 MG tablet Take 1 tablet by mouth  once daily (Patient taking differently: Take 10 mg by mouth daily.)   Menthol, Topical Analgesic, (ICY HOT) 5 % PTCH Place 1 patch onto the skin daily as needed (pain.).   metFORMIN (GLUCOPHAGE) 850 MG tablet Take 850 mg by mouth 3 (three) times daily.   metoprolol tartrate (LOPRESSOR) 25 MG tablet Take 1 tablet by mouth twice daily   nitroGLYCERIN (NITROSTAT) 0.4 MG SL tablet Place 1 tablet (0.4 mg total) under the tongue every 5 (five) minutes as needed for chest pain.   OZEMPIC, 1 MG/DOSE, 4 MG/3ML SOPN Inject 1 mg into the skin once a week.   traMADol (ULTRAM) 50 MG tablet Take 50 mg by mouth 3 (three) times daily as needed for pain.   VITAMIN B1-B12 PO Take 1,000 mg by mouth in the morning.     Allergies:   Versed [midazolam] and Mobic [meloxicam]   Social History   Socioeconomic History   Marital status: Married    Spouse name: Not on file   Number of children: 1   Years of education: 13   Highest education level: Some college, no degree  Occupational History   Occupation: Retired  Tobacco Use   Smoking status: Former    Types: E-cigarettes, Cigarettes   Smokeless tobacco: Never   Tobacco comments:    Stopped vaping in 2023  Vaping Use   Vaping Use: Former   Substances: Nicotine  Substance and Sexual Activity   Alcohol use: Not Currently   Drug use: Not Currently   Sexual activity: Not on file  Other Topics Concern   Not on file  Social History Narrative   Not on file   Social Determinants of Health   Financial Resource Strain: Not on file  Food Insecurity: No Food Insecurity (12/12/2022)   Hunger Vital Sign    Worried About Running Out of Food in the Last Year: Never true    Ran Out of Food in the Last Year: Never true  Transportation Needs: No Transportation Needs (12/12/2022)   PRAPARE - Administrator, Civil Service (Medical): No    Lack of Transportation (Non-Medical): No  Physical Activity: Not on file  Stress: Not on file  Social Connections:  Not on file     Family History: The patient's family history includes Diabetes in her father and maternal uncle. ROS:   Please see the history of present illness.    All 14 point review of systems negative except as described per history of present illness  EKGs/Labs/Other Studies Reviewed:      Recent Labs: 12/12/2022: BUN 12; Creatinine, Ser 0.91; Potassium 4.1; Sodium 135 01/18/2023: Hemoglobin 10.9; Platelets 360  Recent Lipid Panel    Component Value Date/Time   CHOL 133 07/25/2022 1121   TRIG 113 07/25/2022 1121   HDL 41 07/25/2022 1121   CHOLHDL 3.2 07/25/2022 1121   LDLCALC 71 07/25/2022 1121    Physical Exam:    VS:  BP 120/68 (BP Location: Left Arm, Patient Position: Sitting)   Pulse (!) 58  Ht 5\' 3"  (1.6 m)   Wt 175 lb 9.6 oz (79.7 kg)   SpO2 93%   BMI 31.11 kg/m     Wt Readings from Last 3 Encounters:  04/22/23 175 lb 9.6 oz (79.7 kg)  04/12/23 179 lb 14.3 oz (81.6 kg)  04/03/23 178 lb 9.2 oz (81 kg)     GEN:  Well nourished, well developed in no acute distress HEENT: Normal NECK: No JVD; No carotid bruits LYMPHATICS: No lymphadenopathy CARDIAC: RRR, no murmurs, no rubs, no gallops RESPIRATORY:  Clear to auscultation without rales, wheezing or rhonchi  ABDOMEN: Soft, non-tender, non-distended MUSCULOSKELETAL:  No edema; No deformity  SKIN: Warm and dry LOWER EXTREMITIES: no swelling NEUROLOGIC:  Alert and oriented x 3 PSYCHIATRIC:  Normal affect   ASSESSMENT:    1. Coronary artery disease of native artery of native heart with stable angina pectoris (HCC)   2. Atherosclerosis of native coronary artery of native heart with stable angina pectoris (HCC)   3. Benign essential hypertension   4. Angina pectoris (HCC)   5. Type 2 diabetes mellitus with obesity (HCC)   6. Mixed hyperlipidemia    PLAN:    In order of problems listed above:  Coronary disease stable from that point review doing well on appropriate guideline directed medical therapy  myocardial continue aspirin and Plavix which is dual antiplatelet therapy most likely undefinitely Dyslipidemia she is taking Lipitor 80 which I will continue.  She is scheduled to have her fasting lipid profile done by primary care physician will wait for results of it.  I did review K PN which show LDL 71 HDL 41.  I prefer to have LDL less than 55. Benign essential hypertension well-controlled continue present management. Type 2 diabetes followed by antimedicine team last hemoglobin A1c is from May of last year which was 8.2, obviously not well-controlled at that time.   Medication Adjustments/Labs and Tests Ordered: Current medicines are reviewed at length with the patient today.  Concerns regarding medicines are outlined above.  Orders Placed This Encounter  Procedures   EKG 12-Lead   Medication changes: No orders of the defined types were placed in this encounter.   Signed, Georgeanna Lea, MD, [facility] 04/22/2023 12:11 PM    [facility]

## 2023-04-22 NOTE — Patient Instructions (Signed)

## 2023-04-27 ENCOUNTER — Other Ambulatory Visit: Payer: Self-pay | Admitting: Cardiology

## 2023-06-19 ENCOUNTER — Other Ambulatory Visit: Payer: Self-pay | Admitting: Interventional Cardiology

## 2023-09-04 ENCOUNTER — Telehealth: Payer: Self-pay

## 2023-09-04 NOTE — Telephone Encounter (Signed)
   [facility] Pre-operative Risk Assessment    Request for surgical clearance:  What type of surgery is being performed? Colonoscopy   When is this surgery scheduled? TBD   What type of clearance is required (medical clearance vs. Pharmacy clearance to hold med vs. Both)? Pharmacy  Are there any medications that need to be held prior to surgery and how long?Hold Aspirin and Plavix 5 days prior to procedure date  Practice name and name of physician performing surgery? Dr. Marcial Pacas Misenhiemer at [facility]    What is your office phone number: [phone]    7.   What is your office fax number: [phone]  8.   Anesthesia type (None, local, MAC, general) ? Not specified   Tiburcio Pea Shayona Hibbitts 09/04/2023, 4:42 PM  _________________________________________________________________   (provider comments below)

## 2023-09-04 NOTE — Telephone Encounter (Signed)
Dr. Bing Matter,  We have received a surgical clearance request for Angel Reynolds for a colonoscopy procedure.  He has a PMH of atherectomy and stenting of the mid LAD with DES x 1 on 12/2022.  Can you please offer guidance on holding Plavix for 5 days prior to patient's colonoscopy procedure.  Please forward you guidance and recommendations to [facility] PREOP   Thank you

## 2023-09-05 NOTE — Telephone Encounter (Signed)
   Name: Angel Reynolds  DOB: 1955-11-26  MRN: [id number]   Primary Cardiologist: Gypsy Balsam, MD  Chart reviewed as part of pre-operative protocol coverage.   Per Dr. Bing Matter "Should be fine now to hold Plavix for 5 days before procedure."   Patent should continue aspirin 81 mg daily throughout perioperative period.   I will route this recommendation to the requesting party via Epic fax function and remove from pre-op pool. Please call with questions.  Carlos Levering, NP 09/05/2023, 12:50 PM

## 2023-12-23 ENCOUNTER — Other Ambulatory Visit: Payer: Self-pay | Admitting: Interventional Cardiology

## 2024-01-11 ENCOUNTER — Other Ambulatory Visit: Payer: Self-pay | Admitting: Cardiology

## 2024-01-28 ENCOUNTER — Other Ambulatory Visit: Payer: Self-pay | Admitting: Cardiology

## 2024-04-02 DIAGNOSIS — D692 Other nonthrombocytopenic purpura: Secondary | ICD-10-CM | POA: Diagnosis not present

## 2024-04-02 DIAGNOSIS — E669 Obesity, unspecified: Secondary | ICD-10-CM | POA: Diagnosis not present

## 2024-04-02 DIAGNOSIS — E7800 Pure hypercholesterolemia, unspecified: Secondary | ICD-10-CM | POA: Diagnosis not present

## 2024-04-02 DIAGNOSIS — I10 Essential (primary) hypertension: Secondary | ICD-10-CM | POA: Diagnosis not present

## 2024-04-02 DIAGNOSIS — E1169 Type 2 diabetes mellitus with other specified complication: Secondary | ICD-10-CM | POA: Diagnosis not present

## 2024-04-02 DIAGNOSIS — M545 Low back pain, unspecified: Secondary | ICD-10-CM | POA: Diagnosis not present

## 2024-04-02 DIAGNOSIS — I2089 Other forms of angina pectoris: Secondary | ICD-10-CM | POA: Diagnosis not present

## 2024-04-02 DIAGNOSIS — I2510 Atherosclerotic heart disease of native coronary artery without angina pectoris: Secondary | ICD-10-CM | POA: Diagnosis not present

## 2024-04-02 DIAGNOSIS — R7309 Other abnormal glucose: Secondary | ICD-10-CM | POA: Diagnosis not present

## 2024-04-09 ENCOUNTER — Other Ambulatory Visit: Payer: Self-pay | Admitting: Cardiology

## 2024-04-10 ENCOUNTER — Other Ambulatory Visit: Payer: Self-pay | Admitting: Cardiology

## 2024-04-29 DIAGNOSIS — E669 Obesity, unspecified: Secondary | ICD-10-CM | POA: Diagnosis not present

## 2024-04-29 DIAGNOSIS — I10 Essential (primary) hypertension: Secondary | ICD-10-CM | POA: Diagnosis not present

## 2024-04-29 DIAGNOSIS — I2510 Atherosclerotic heart disease of native coronary artery without angina pectoris: Secondary | ICD-10-CM | POA: Diagnosis not present

## 2024-04-29 DIAGNOSIS — M545 Low back pain, unspecified: Secondary | ICD-10-CM | POA: Diagnosis not present

## 2024-04-29 DIAGNOSIS — E7800 Pure hypercholesterolemia, unspecified: Secondary | ICD-10-CM | POA: Diagnosis not present

## 2024-04-29 DIAGNOSIS — E1169 Type 2 diabetes mellitus with other specified complication: Secondary | ICD-10-CM | POA: Diagnosis not present

## 2024-04-29 DIAGNOSIS — R748 Abnormal levels of other serum enzymes: Secondary | ICD-10-CM | POA: Diagnosis not present

## 2024-04-29 DIAGNOSIS — I2089 Other forms of angina pectoris: Secondary | ICD-10-CM | POA: Diagnosis not present

## 2024-04-29 DIAGNOSIS — D692 Other nonthrombocytopenic purpura: Secondary | ICD-10-CM | POA: Diagnosis not present

## 2024-04-30 ENCOUNTER — Other Ambulatory Visit: Payer: Self-pay | Admitting: Cardiology

## 2024-05-04 ENCOUNTER — Other Ambulatory Visit: Payer: Self-pay | Admitting: Cardiology

## 2024-05-04 NOTE — Progress Notes (Unsigned)
 Cardiology Office Note   Date:  05/05/2024  ID:  Chareese M Colclough, DOB 03-30-1956, MRN [id number] PCP: Jama Chow, MD  [facility] Providers Cardiologist:  Lamar Fitch, MD     History of Present Illness Angel Reynolds is a 68 y.o. female with a past medical history of CAD, elevated LP(a) hypertension, DM 2, peripheral neuropathy, chronic pain syndrome.  12/11/2022 left heart cath with coronary atherectomy and stenting of the mid LAD; CTO of her RCA with left-to-right collaterals; distal circumflex 100% stenosed 03/19/2022 left heart cath severe three-vessel CAD recommendations for TCTS consultation >> who advised medical therapy as there were no adequate graftable sites 02/22/2022 coronary CTA calcium  score 5105, 99th percentile >> FFR hemodynamically significant 02/13/2022 echo EF 50 to 55%, mild LVH, grade 1 DD, mild MR, moderate calcification of the aortic valve with mild thickening  She established with Dr. Fitch in 2023 for the evaluation of chest pain at the behest of her PCP.  She underwent a coronary CTA revealing a calcium  score of 5105, FFR was positive.  She underwent left heart cath revealing severe three-vessel CAD with recommendations for TCTS evaluation who advised she did not have any adequate graftable sites and recommendation for medical optimization.  On 12/11/2022 she underwent left heart cath with coronary arthrectomy and stenting of the mid LAD, CTO of her RCA with left-to-right collaterals, distal circumflex was 100% stenosed.  Most recently she was evaluated by Dr. Fitch on 04/22/2023, she was participating in cardiac rehab and continued to exercise, not had any episodes of chest pain, no changes were made to her medications or plan of care she was advised to follow-up in 6 months.  She presents today for follow-up of her coronary artery disease.  She has been doing stable since she was last evaluated in our office.  She has had 1 episode that she has needed to  use her nitroglycerin , this occurred during the night and was promptly relieved by her nitroglycerin .  She follows closely with her PCP and sees them every month, he recently checked lipid panel and advised that it was at goal.She denies palpitations, dyspnea, pnd, orthopnea, n, v, dizziness, syncope, edema, weight gain, or early satiety. s   ROS: Review of Systems  Cardiovascular:  Positive for chest pain.  Endo/Heme/Allergies:  Bruises/bleeds easily.     Studies Reviewed      Cardiac Studies & Procedures   ______________________________________________________________________________________________ CARDIAC CATHETERIZATION  CARDIAC CATHETERIZATION 12/11/2022  Conclusion   Prox LAD lesion is 35% stenosed.   Prox RCA lesion is 100% stenosed. L-R collaterals.   1st Mrg lesion is 85% stenosed.   RPDA lesion is 100% stenosed.   Dist Cx lesion is 100% stenosed. Unable to cross with miracle brothers wire.   Mid LAD lesion is 80% stenosed.   A drug-eluting stent was successfully placed using a STENT SYNERGY XD 3.0X32, postdilated to 3.5 mm.   Post intervention, there is a 0% residual stenosis.   The left ventricular systolic function is normal.   LV end diastolic pressure is normal.   The left ventricular ejection fraction is 50-55% by visual estimate.   There is no aortic valve stenosis.  Successful atherectomy and stenting of the mid LAD with a 3.0 x 32 Synergy drug-eluting stent postdilated to 3.5 mm.  Unable to cross the circumflex occlusion.  Patient did get somewhat restless towards the end of the procedure and sedation did not seem to be helping.  Will watch overnight.  Iron  studies  pending.  If iron  is low, would see if she is a candidate for IV iron  to help with her chronic anemia.  Findings Coronary Findings Diagnostic  Dominance: Right  Left Anterior Descending There is moderate diffuse disease throughout the vessel. Prox LAD lesion is 35% stenosed. Mid LAD lesion is 80%  stenosed.  First Diagonal Branch  Left Circumflex There is moderate diffuse disease throughout the vessel. Dist Cx lesion is 100% stenosed. The lesion is chronically occluded.  First Obtuse Marginal Branch 1st Mrg lesion is 85% stenosed.  Second Obtuse Marginal Branch  Third Obtuse Marginal Branch Collaterals 3rd Mrg filled by collaterals from 1st Mrg.  Right Coronary Artery There is moderate diffuse disease throughout the vessel. Prox RCA lesion is 100% stenosed. Dist RCA lesion is 100% stenosed.  Right Posterior Descending Artery Collaterals RPDA filled by collaterals from Dist LAD.  RPDA lesion is 100% stenosed.  Right Posterior Atrioventricular Artery Vessel is small in size.  First Right Posterolateral Branch Vessel is small in size.  Third Right Posterolateral Branch Collaterals 3rd RPL filled by collaterals from Dist Cx.  Intervention  Mid LAD lesion Atherectomy CATH LAUNCHER 6FR EBU3.5 guide catheter was inserted. WIRE VIPERWIRE COR FLEX .012 guidewire was used to cross lesion. Orbital atherectomy was performed using a CROWN DIAMONDBACK CLASSIC 1.25. Stent Lesion crossed with guidewire using a WIRE RUNTHROUGH .[id number]. Pre-stent angioplasty was performed using a BALLN WOLVERINE 3.00X10. A drug-eluting stent was successfully placed using a STENT SYNERGY XD 3.0X32. Stent strut is well apposed. Post-stent angioplasty was performed using a BALL SAPPHIRE NC24 3.5X15. Post-Intervention Lesion Assessment The intervention was successful. Pre-interventional TIMI flow is 3. Post-intervention TIMI flow is 3. No complications occurred at this lesion. There is a 0% residual stenosis post intervention.   CARDIAC CATHETERIZATION  CARDIAC CATHETERIZATION 03/19/2022  Conclusion CONCLUSIONS: Severe three-vessel calcification Left main is widely patent Segmental 50 to 80% proximal to mid LAD with abnormal RFR of 0.81 (normal greater than 0. 89). Circumflex significant  obstruction OM1 of 90% in the mid to distal third, 95% stenosis in the distal circumflex before OM 3, and total occlusion of OM 2 collateralized from OM 3. Total occlusion of proximal to mid RCA.  RCA collateralized apically and through septal perforators from the LAD. Inferobasal akinesis.  EF 45%.  LVEDP 10 mmHg.  RECOMMENDATIONS:  Continue medical therapy for ischemia prevention and risk reduction. T CTS consultation to consider surgery in this 67 year old diabetic with severe three-vessel CAD and decreased EF.  Findings Coronary Findings Diagnostic  Dominance: Right  Left Anterior Descending There is moderate diffuse disease throughout the vessel. Prox LAD lesion is 35% stenosed. Prox LAD to Mid LAD lesion is 80% stenosed.  First Diagonal Branch  Left Circumflex There is moderate diffuse disease throughout the vessel. Mid Cx lesion is 95% stenosed.  First Obtuse Marginal Branch 1st Mrg lesion is 85% stenosed.  Second Obtuse Marginal Branch Collaterals 2nd Mrg filled by collaterals from 3rd Mrg.  2nd Mrg lesion is 100% stenosed.  Right Coronary Artery There is moderate diffuse disease throughout the vessel. Prox RCA lesion is 100% stenosed. Dist RCA lesion is 100% stenosed.  Right Posterior Descending Artery Collaterals RPDA filled by collaterals from Dist LAD.  RPDA lesion is 100% stenosed.  Right Posterior Atrioventricular Artery Vessel is small in size.  First Right Posterolateral Branch Vessel is small in size.  Third Right Posterolateral Branch Collaterals 3rd RPL filled by collaterals from Dist Cx.  Intervention  No interventions have been documented.  ECHOCARDIOGRAM  ECHOCARDIOGRAM COMPLETE 02/13/2022  Narrative ECHOCARDIOGRAM REPORT    Patient Name:   MICHAELAH CREDEUR Date of Exam: 02/13/2022 Medical Rec #:  [id number]       Height:       62.0 in Accession #:    [id number]      Weight:       186.0 lb Date of Birth:  24-Oct-1956        BSA:          1.854 m Patient Age:    65 years        BP:           106/50 mmHg Patient Gender: F               HR:           65 bpm. Exam Location:  [place]  Procedure: 2D Echo, Cardiac Doppler, Color Doppler and Strain Analysis  Indications:    Angina pectoris (HCC) [I20.9 (ICD-10-CM)]; Benign essential hypertension [I10 (ICD-10-CM)]; Type 2 diabetes mellitus with obesity (HCC) [E11.69, E66.9 (ICD-10-CM)]; Mixed hyperlipidemia [E78.2 (ICD-10-CM)]; Chest pain, unspecified type [R07.9 (ICD-10-CM)]  History:        Patient has no prior history of Echocardiogram examinations.  Sonographer:    Lynwood Silvas RDCS Referring Phys: [phone] LAMAR PARAS KRASOWSKI  IMPRESSIONS   1. GLS -12.2. Left ventricular ejection fraction, by estimation, is 50 to 55%. The left ventricle has low normal function. The left ventricle has no regional wall motion abnormalities. There is mild left ventricular hypertrophy. Left ventricular diastolic parameters are consistent with Grade I diastolic dysfunction (impaired relaxation). 2. Right ventricular systolic function is normal. The right ventricular size is normal. There is normal pulmonary artery systolic pressure. 3. The mitral valve is normal in structure. Mild mitral valve regurgitation. No evidence of mitral stenosis. 4. The aortic valve is normal in structure. There is moderate calcification of the aortic valve. There is mild thickening of the aortic valve. Aortic valve regurgitation is not visualized. No aortic stenosis is present. 5. The inferior vena cava is normal in size with greater than 50% respiratory variability, suggesting right atrial pressure of 3 mmHg.  FINDINGS Left Ventricle: GLS -12.2. Left ventricular ejection fraction, by estimation, is 50 to 55%. The left ventricle has low normal function. The left ventricle has no regional wall motion abnormalities. The left ventricular internal cavity size was normal in size. There is mild left ventricular  hypertrophy. Left ventricular diastolic parameters are consistent with Grade I diastolic dysfunction (impaired relaxation).  Right Ventricle: The right ventricular size is normal. No increase in right ventricular wall thickness. Right ventricular systolic function is normal. There is normal pulmonary artery systolic pressure. The tricuspid regurgitant velocity is 1.37 m/s, and with an assumed right atrial pressure of 3 mmHg, the estimated right ventricular systolic pressure is 10.5 mmHg.  Left Atrium: Left atrial size was normal in size.  Right Atrium: Right atrial size was normal in size.  Pericardium: There is no evidence of pericardial effusion.  Mitral Valve: The mitral valve is normal in structure. Mild mitral valve regurgitation. No evidence of mitral valve stenosis.  Tricuspid Valve: The tricuspid valve is normal in structure. Tricuspid valve regurgitation is not demonstrated. No evidence of tricuspid stenosis.  Aortic Valve: The aortic valve is normal in structure. There is moderate calcification of the aortic valve. There is mild thickening of the aortic valve. Aortic valve regurgitation is not visualized. No aortic stenosis is present.  Pulmonic Valve: The pulmonic  valve was normal in structure. Pulmonic valve regurgitation is not visualized. No evidence of pulmonic stenosis.  Aorta: The aortic root is normal in size and structure.  Venous: The inferior vena cava is normal in size with greater than 50% respiratory variability, suggesting right atrial pressure of 3 mmHg.  IAS/Shunts: No atrial level shunt detected by color flow Doppler.   LEFT VENTRICLE PLAX 2D LVIDd:         4.80 cm     Diastology LVIDs:         4.00 cm     LV e' medial:    3.59 cm/s LV PW:         1.20 cm     LV E/e' medial:  25.6 LV IVS:        1.20 cm     LV e' lateral:   6.09 cm/s LVOT diam:     1.90 cm     LV E/e' lateral: 15.1 LV SV:         49 LV SV Index:   27 LVOT Area:     2.84 cm  LV Volumes  (MOD) LV vol d, MOD A2C: 67.6 ml LV vol d, MOD A4C: 75.8 ml LV vol s, MOD A2C: 32.9 ml LV vol s, MOD A4C: 36.9 ml LV SV MOD A2C:     34.7 ml LV SV MOD A4C:     75.8 ml LV SV MOD BP:      38.2 ml  RIGHT VENTRICLE            IVC RV S prime:     9.14 cm/s  IVC diam: 1.80 cm TAPSE (M-mode): 2.5 cm  LEFT ATRIUM             Index        RIGHT ATRIUM           Index LA diam:        3.80 cm 2.05 cm/m   RA Area:     12.50 cm LA Vol (A2C):   62.5 ml 33.72 ml/m  RA Volume:   27.40 ml  14.78 ml/m LA Vol (A4C):   39.6 ml 21.36 ml/m LA Biplane Vol: 51.9 ml 28.00 ml/m AORTIC VALVE LVOT Vmax:   78.30 cm/s LVOT Vmean:  54.400 cm/s LVOT VTI:    0.174 m  AORTA Ao Root diam: 2.70 cm Ao Asc diam:  3.30 cm Ao Desc diam: 2.20 cm  MITRAL VALVE                TRICUSPID VALVE MV Area (PHT): 3.40 cm     TR Peak grad:   7.5 mmHg MV Decel Time: 223 msec     TR Vmax:        137.00 cm/s MV E velocity: 91.90 cm/s MV A velocity: 139.00 cm/s  SHUNTS MV E/A ratio:  0.66         Systemic VTI:  0.17 m Systemic Diam: 1.90 cm  Lamar Fitch MD Electronically signed by Lamar Fitch MD Signature Date/Time: 02/13/2022/2:11:41 PM    Final      CT SCANS  CT CORONARY FRACTIONAL FLOW RESERVE DATA PREP 02/22/2022  Narrative EXAM: CT FFR analysis was performed on the original cardiac CTA dataset. Diagrammatic representation of the CT FFR analysis is provided in a separate PDF document in PACS. This dictation was created using the PDF document and an interactive 3D model of the results. The 3D model is not available in the EMR/PACS.  INTERPRETATION:  CT FFR provides simultaneous calculation of pressure and flow across the entire coronary tree. For clinical decision making, CT FFR values should be obtained 1-2 cm distal to the lower border of each stenosis measured. Coronary CTA-related artifacts may impair the diagnostic accuracy of the original cardiac CTA and FFR CT results. *Due to the  fact that CT FFR represents a mathematically-derived analysis, it is recommended that the results be interpreted as follows:  1. CT FFR >0.80: Low likelihood of hemodynamic significance. 2. CT FFR 0.76-0.80: Borderline likelihood of hemodynamic significance. 3. CT FFR =< 0.75: High likelihood of hemodynamic significance.  *Coronary CT Angiography-derived Fractional Flow Reserve Testing in Patients with Stable Coronary Artery Disease: Recommendations on Interpretation and Reporting. Radiology: Cardiothoracic Imaging. 2019;1(5):e190050  FINDINGS: 1. Left Main: 1.0; low likelihood of hemodynamic significance.  2. Prox LAD: 0.96; low likelihood of hemodynamic significance. 3. Mid LAD: 0.80; low likelihood of hemodynamic significance. 4. Distal LAD: 0.73; low likelihood of hemodynamic significance. Tapering artifact. Delta FFR 0.07. 5. LCX: 0.95; low likelihood of hemodynamic significance. 6. OM1: 0.67; high likelihood of hemodynamic significance. 7. RCA: Occluded.  IMPRESSION:  1.  Mid LAD negative by CT FFR.  2.  Distal LCX negative by CT FFR.  3.  OM1 positive by CT FFR.  4.  RCA not modeled as likely occluded.  Darryle Decent, MD   Electronically Signed By: Darryle Decent M.D. On: 02/22/2022 13:32   CT CORONARY MORPH W/CTA COR W/SCORE 02/22/2022  Addendum 02/22/2022  1:30 PM ADDENDUM REPORT: 02/22/2022 13:27  CLINICAL DATA:  Chest pain  EXAM: Cardiac/Coronary CTA  TECHNIQUE: A non-contrast, gated CT scan was obtained with axial slices of 3 mm through the heart for calcium  scoring. Calcium  scoring was performed using the Agatston method. A 120 kV prospective, gated, contrast cardiac scan was obtained. Gantry rotation speed was 250 msecs and collimation was 0.6 mm. Two sublingual nitroglycerin  tablets (0.8 mg) were given. The 3D data set was reconstructed in 5% intervals of the 35-75% of the R-R cycle. Diastolic phases were analyzed on a dedicated workstation  using MPR, MIP, and VRT modes. The patient received 95 cc of contrast.  FINDINGS: Image quality: Excellent.  Noise artifact is: Limited.  Coronary Arteries:  Normal coronary origin.  Right dominance.  Left main: The left main is a large caliber vessel with a normal take off from the left coronary cusp that bifurcates to form a left anterior descending artery and a left circumflex artery. There is no plaque or stenosis.  Left anterior descending artery: The proximal LAD contains mild calcified plaque (25-49%). The mid LAD contain heavily calcified plaque that is circumferential, concerning for severe stenosis (70-99%).  Left circumflex artery: The LCX is non-dominant. The proximal and mid segments contain mild calcified plaque (25-49%). The distal LCX contains moderate calcified plaque (50-69%). OM1 contains a severe stenosis (70-99%).  Right coronary artery: The RCA is dominant with normal take off from the right coronary cusp. The proximal RCA appears occluded, 100%.  Right Atrium: Right atrial size is within normal limits.  Right Ventricle: The right ventricular cavity is within normal limits.  Left Atrium: Left atrial size is normal in size with no left atrial appendage filling defect.  Left Ventricle: The ventricular cavity size is within normal limits.  Pulmonary arteries: Normal in size without proximal filling defect.  Pulmonary veins: Normal pulmonary venous drainage.  Pericardium: Normal thickness without significant effusion or calcium  present.  Cardiac valves: The aortic valve is trileaflet without significant calcification. The  mitral valve is normal without significant calcification.  Aorta: Normal caliber without significant disease.  Extra-cardiac findings: See attached radiology report for non-cardiac structures.  IMPRESSION: 1. Coronary calcium  score of 5105. This was 99th percentile for age-, sex, and race-matched controls.  2. Normal  coronary origin with right dominance.  3. Severe calcified plaque (70-99%) in the mid LAD.  4. Proximal RCA appears occluded (100%).  5. Moderate calcified plaque (50-69%) in the distal LCX.  6. Severe stenosis in OM1 (70-99%).  RECOMMENDATIONS: 1. Severe stenosis. CT FFR will be submitted. Consider symptom-guided anti-ischemic pharmacotherapy as well as risk factor modification per guideline directed care. Invasive coronary angiography recommended with revascularization per published guideline statements.  Darryle Decent, MD   Electronically Signed By: Darryle Decent M.D. On: 02/22/2022 13:27  Narrative EXAM: OVER-READ INTERPRETATION  CT CHEST  The following report is an over-read performed by radiologist Dr. Tanda Lyons of [facility] on 02/22/2022. This over-read does not include interpretation of cardiac or coronary anatomy or pathology. The coronary calcium  score/coronary CTA interpretation by the cardiologist is attached.  COMPARISON:  Chest two views 08/16/2006  FINDINGS: Cardiovascular: There are no significant extracardiac vascular findings.  Mediastinum/Nodes: There are no enlarged lymph nodes within the visualized mediastinum.  Lungs/Pleura: There is no pleural effusion. There is a left lower lobe peripheral subpleural 10 x 4 mm (average 7 mm pulmonary nodule.  Upper abdomen: No significant findings in the visualized upper abdomen.  Musculoskeletal/Chest wall: Moderate multilevel degenerative disc space narrowing and anterior bridging osteophytes of the visualized lower thoracic spine.  IMPRESSION: There is a 7 mm left lower lobe peripheral pulmonary nodule. Correlation with any prior chest or abdominal CT would be helpful to assess for stability. If no prior CT is available for comparison, non-contrast chest CT at 6-12 months is recommended. If the nodule is stable at time of repeat CT, then future CT at 18-24 months (from today's  scan) is considered optional for low-risk patients, but is recommended for high-risk patients. This recommendation follows the consensus statement: Guidelines for Management of Incidental Pulmonary Nodules Detected on CT Images: From the Fleischner Society 2017; Radiology 2017; 284:228-243.  Electronically Signed: By: Tanda Lyons M.D. On: 02/22/2022 12:12     ______________________________________________________________________________________________      Risk Assessment/Calculations           Physical Exam VS:  BP 100/60   Pulse (!) 58   Ht 5' 3 (1.6 m)   Wt 180 lb (81.6 kg)   SpO2 95%   BMI 31.89 kg/m        Wt Readings from Last 3 Encounters:  05/05/24 180 lb (81.6 kg)  04/22/23 175 lb 9.6 oz (79.7 kg)  04/12/23 179 lb 14.3 oz (81.6 kg)    GEN: Well nourished, well developed in no acute distress NECK: No JVD; No carotid bruits CARDIAC: RRR, no murmurs, rubs, gallops RESPIRATORY:  Clear to auscultation without rales, wheezing or rhonchi  ABDOMEN: Soft, non-tender, non-distended EXTREMITIES:  No edema; No deformity   ASSESSMENT AND PLAN CAD-severe three-vessel CAD however not a candidate for bypass surgery, she underwent a coronary atherectomy in 2024 with DES x 1 to mid LAD.  Continue aspirin  81 mg daily, Plavix  75 mg daily, Lipitor  80 mg daily, Imdur  120 mg daily, nitroglycerin  as needed, metoprolol  25 mg twice daily. Stable with no anginal symptoms. No indication for ischemic evaluation.  Heart healthy diet and regular cardiovascular exercise encouraged.  She had 1 episode of chest pain as  outlined above in the HPI, advised her if this increases with frequency to notify our office and we could increase her Imdur  to 180 mg daily.  Elevated LP(a)/dyslipidemia - LP(a) elevated at 342.6 in 2023; lipids formally managed by her PCP, will request recent lab work from their office.  Currently on Lipitor  80 mg daily.  DM2  -thinks her most recent A1c was 7.2%, this is  better controlled than it has been in the past.       Dispo: Follow up in 6 months with Dr. Krasowski.   Signed, Delon JAYSON Hoover, NP

## 2024-05-04 NOTE — Telephone Encounter (Signed)
 Pt has appointment with Krasowski on 05/05/24

## 2024-05-05 ENCOUNTER — Encounter: Payer: Self-pay | Admitting: Cardiology

## 2024-05-05 ENCOUNTER — Ambulatory Visit: Attending: Cardiology | Admitting: Cardiology

## 2024-05-05 VITALS — BP 100/60 | HR 58 | Ht 63.0 in | Wt 180.0 lb

## 2024-05-05 DIAGNOSIS — E119 Type 2 diabetes mellitus without complications: Secondary | ICD-10-CM

## 2024-05-05 DIAGNOSIS — E782 Mixed hyperlipidemia: Secondary | ICD-10-CM | POA: Diagnosis not present

## 2024-05-05 DIAGNOSIS — E669 Obesity, unspecified: Secondary | ICD-10-CM

## 2024-05-05 DIAGNOSIS — E1169 Type 2 diabetes mellitus with other specified complication: Secondary | ICD-10-CM

## 2024-05-05 DIAGNOSIS — I10 Essential (primary) hypertension: Secondary | ICD-10-CM | POA: Diagnosis not present

## 2024-05-05 DIAGNOSIS — I25118 Atherosclerotic heart disease of native coronary artery with other forms of angina pectoris: Secondary | ICD-10-CM | POA: Diagnosis not present

## 2024-05-05 NOTE — Patient Instructions (Signed)
 Medication Instructions:   No changes   *If you need a refill on your cardiac medications before your next appointment, please call your pharmacy*  Lab Work:  None today   If you have labs (blood work) drawn today and your tests are completely normal, you will receive your results only by: MyChart Message (if you have MyChart) OR A paper copy in the mail If you have any lab test that is abnormal or we need to change your treatment, we will call you to review the results.  Testing/Procedures:  None   Follow-Up: At [facility], you and your health needs are our priority.  As part of our continuing mission to provide you with exceptional heart care, our providers are all part of one team.  This team includes your primary Cardiologist (physician) and Advanced Practice Providers or APPs (Physician Assistants and Nurse Practitioners) who all work together to provide you with the care you need, when you need it.  Your next appointment:   6 month(s)  Provider:   Lamar Fitch, MD    We recommend signing up for the patient portal called MyChart.  Sign up information is provided on this After Visit Summary.  MyChart is used to connect with patients for Virtual Visits (Telemedicine).  Patients are able to view lab/test results, encounter notes, upcoming appointments, etc.  Non-urgent messages can be sent to your provider as well.   To learn more about what you can do with MyChart, go to [email or web address].   Other Instructions  Have a great summer!!!  If you have more episodes of chest pain, call and let us  know, we could increase your Imdur  dose.

## 2024-05-12 ENCOUNTER — Other Ambulatory Visit: Payer: Self-pay | Admitting: Cardiology

## 2024-05-30 ENCOUNTER — Other Ambulatory Visit: Payer: Self-pay | Admitting: Cardiology

## 2024-06-14 ENCOUNTER — Other Ambulatory Visit: Payer: Self-pay | Admitting: Cardiology

## 2024-06-16 ENCOUNTER — Telehealth: Payer: Self-pay | Admitting: Cardiology

## 2024-06-16 NOTE — Telephone Encounter (Signed)
*  STAT* If patient is at the pharmacy, call can be transferred to refill team.   1. Which medications need to be refilled? (please list name of each medication and dose if known)  metoprolol  tartrate (LOPRESSOR ) 25 MG tablet  2. Which pharmacy/location (including street and city if local pharmacy) is medication to be sent to? [facility] - [place], [place] - [address]   3. Do they need a 30 day or 90 day supply?  90 day supply  Patient says she has been completely out of medication for a few days.

## 2024-06-17 MED ORDER — METOPROLOL TARTRATE 25 MG PO TABS: 25.0000 mg | ORAL_TABLET | Freq: Two times a day (BID) | ORAL | 3 refills | Status: AC

## 2024-06-17 NOTE — Telephone Encounter (Signed)
 Rx sent to pharmacy

## 2024-12-01 ENCOUNTER — Ambulatory Visit: Admitting: Cardiology

## 2025-02-04 ENCOUNTER — Ambulatory Visit: Admitting: Cardiology
